# Patient Record
Sex: Female | Born: 1950
Health system: Southern US, Community
[De-identification: ages and names within clinical notes are randomized; demographics above are authoritative.]

## PROBLEM LIST (undated history)

## (undated) DIAGNOSIS — M069 Rheumatoid arthritis, unspecified: Secondary | ICD-10-CM

## (undated) DIAGNOSIS — F329 Major depressive disorder, single episode, unspecified: Secondary | ICD-10-CM

## (undated) DIAGNOSIS — F32A Depression, unspecified: Secondary | ICD-10-CM

## (undated) DIAGNOSIS — G4730 Sleep apnea, unspecified: Secondary | ICD-10-CM

## (undated) DIAGNOSIS — I10 Essential (primary) hypertension: Secondary | ICD-10-CM

## (undated) DIAGNOSIS — E785 Hyperlipidemia, unspecified: Secondary | ICD-10-CM

## (undated) DIAGNOSIS — R7611 Nonspecific reaction to tuberculin skin test without active tuberculosis: Secondary | ICD-10-CM

## (undated) DIAGNOSIS — N289 Disorder of kidney and ureter, unspecified: Secondary | ICD-10-CM

## (undated) HISTORY — PX: CERVICAL BIOPSY: SHX590

## (undated) HISTORY — PX: KIDNEY SURGERY: SHX687

## (undated) HISTORY — PX: BREAST SURGERY: SHX581

## (undated) HISTORY — PX: TUBAL LIGATION: SHX77

## (undated) HISTORY — PX: KNEE ARTHROPLASTY: SHX992

---

## 1996-07-01 HISTORY — PX: BREAST LUMPECTOMY: SHX2

## 1998-04-05 ENCOUNTER — Other Ambulatory Visit: Admission: RE | Admit: 1998-04-05 | Discharge: 1998-04-05 | Payer: Self-pay | Admitting: Obstetrics and Gynecology

## 2000-09-29 ENCOUNTER — Encounter: Payer: Self-pay | Admitting: Obstetrics and Gynecology

## 2000-10-03 ENCOUNTER — Encounter (INDEPENDENT_AMBULATORY_CARE_PROVIDER_SITE_OTHER): Payer: Self-pay | Admitting: Specialist

## 2000-10-03 ENCOUNTER — Ambulatory Visit (HOSPITAL_COMMUNITY): Admission: RE | Admit: 2000-10-03 | Discharge: 2000-10-03 | Payer: Self-pay | Admitting: Obstetrics and Gynecology

## 2001-11-26 ENCOUNTER — Other Ambulatory Visit: Admission: RE | Admit: 2001-11-26 | Discharge: 2001-11-26 | Payer: Self-pay | Admitting: Obstetrics and Gynecology

## 2001-12-03 ENCOUNTER — Ambulatory Visit (HOSPITAL_COMMUNITY): Admission: RE | Admit: 2001-12-03 | Discharge: 2001-12-03 | Payer: Self-pay | Admitting: *Deleted

## 2002-05-12 ENCOUNTER — Encounter: Admission: RE | Admit: 2002-05-12 | Discharge: 2002-05-12 | Payer: Self-pay | Admitting: *Deleted

## 2003-02-01 ENCOUNTER — Other Ambulatory Visit: Admission: RE | Admit: 2003-02-01 | Discharge: 2003-02-01 | Payer: Self-pay | Admitting: Obstetrics and Gynecology

## 2003-03-03 ENCOUNTER — Encounter: Admission: RE | Admit: 2003-03-03 | Discharge: 2003-03-03 | Payer: Self-pay | Admitting: Otolaryngology

## 2003-03-03 ENCOUNTER — Encounter: Payer: Self-pay | Admitting: Otolaryngology

## 2004-04-27 ENCOUNTER — Other Ambulatory Visit: Admission: RE | Admit: 2004-04-27 | Discharge: 2004-04-27 | Payer: Self-pay | Admitting: Obstetrics and Gynecology

## 2004-11-09 ENCOUNTER — Ambulatory Visit (HOSPITAL_COMMUNITY): Admission: RE | Admit: 2004-11-09 | Discharge: 2004-11-09 | Payer: Self-pay | Admitting: Gastroenterology

## 2004-11-16 ENCOUNTER — Ambulatory Visit (HOSPITAL_BASED_OUTPATIENT_CLINIC_OR_DEPARTMENT_OTHER): Admission: RE | Admit: 2004-11-16 | Discharge: 2004-11-16 | Payer: Self-pay | Admitting: Geriatric Medicine

## 2004-11-25 ENCOUNTER — Ambulatory Visit: Payer: Self-pay | Admitting: Internal Medicine

## 2005-05-31 ENCOUNTER — Encounter: Admission: RE | Admit: 2005-05-31 | Discharge: 2005-05-31 | Payer: Self-pay | Admitting: Obstetrics and Gynecology

## 2005-06-05 ENCOUNTER — Other Ambulatory Visit: Admission: RE | Admit: 2005-06-05 | Discharge: 2005-06-05 | Payer: Self-pay | Admitting: Obstetrics and Gynecology

## 2005-06-11 ENCOUNTER — Ambulatory Visit (HOSPITAL_COMMUNITY): Admission: RE | Admit: 2005-06-11 | Discharge: 2005-06-11 | Payer: Self-pay | Admitting: Obstetrics and Gynecology

## 2005-07-01 HISTORY — PX: NEPHRECTOMY: SHX65

## 2005-07-04 ENCOUNTER — Encounter: Admission: RE | Admit: 2005-07-04 | Discharge: 2005-07-04 | Payer: Self-pay | Admitting: Otolaryngology

## 2005-07-10 ENCOUNTER — Ambulatory Visit (HOSPITAL_COMMUNITY): Admission: RE | Admit: 2005-07-10 | Discharge: 2005-07-10 | Payer: Self-pay | Admitting: Urology

## 2005-08-16 ENCOUNTER — Inpatient Hospital Stay (HOSPITAL_COMMUNITY): Admission: RE | Admit: 2005-08-16 | Discharge: 2005-08-20 | Payer: Self-pay | Admitting: Urology

## 2005-08-16 ENCOUNTER — Encounter (INDEPENDENT_AMBULATORY_CARE_PROVIDER_SITE_OTHER): Payer: Self-pay | Admitting: Specialist

## 2007-04-13 ENCOUNTER — Encounter: Admission: RE | Admit: 2007-04-13 | Discharge: 2007-04-13 | Payer: Self-pay | Admitting: Obstetrics and Gynecology

## 2007-06-16 ENCOUNTER — Emergency Department (HOSPITAL_COMMUNITY): Admission: EM | Admit: 2007-06-16 | Discharge: 2007-06-17 | Payer: Self-pay | Admitting: Emergency Medicine

## 2007-09-22 ENCOUNTER — Encounter: Admission: RE | Admit: 2007-09-22 | Discharge: 2007-09-22 | Payer: Self-pay | Admitting: Obstetrics and Gynecology

## 2008-07-01 HISTORY — PX: ANKLE ARTHRODESIS: SUR49

## 2008-11-25 ENCOUNTER — Encounter: Admission: RE | Admit: 2008-11-25 | Discharge: 2008-11-25 | Payer: Self-pay | Admitting: Obstetrics and Gynecology

## 2009-06-25 ENCOUNTER — Emergency Department (HOSPITAL_COMMUNITY): Admission: EM | Admit: 2009-06-25 | Discharge: 2009-06-25 | Payer: Self-pay | Admitting: Emergency Medicine

## 2009-06-27 ENCOUNTER — Inpatient Hospital Stay (HOSPITAL_COMMUNITY): Admission: RE | Admit: 2009-06-27 | Discharge: 2009-06-29 | Payer: Self-pay | Admitting: Specialist

## 2009-09-28 ENCOUNTER — Encounter: Admission: RE | Admit: 2009-09-28 | Discharge: 2009-10-25 | Payer: Self-pay | Admitting: Specialist

## 2010-05-01 ENCOUNTER — Encounter: Admission: RE | Admit: 2010-05-01 | Discharge: 2010-05-01 | Payer: Self-pay | Admitting: Obstetrics and Gynecology

## 2010-06-07 ENCOUNTER — Encounter
Admission: RE | Admit: 2010-06-07 | Discharge: 2010-06-07 | Payer: Self-pay | Source: Home / Self Care | Admitting: Specialist

## 2010-07-22 ENCOUNTER — Encounter: Payer: Self-pay | Admitting: Urology

## 2010-10-01 LAB — BASIC METABOLIC PANEL
Chloride: 100 mEq/L (ref 96–112)
Creatinine, Ser: 0.95 mg/dL (ref 0.4–1.2)
GFR calc Af Amer: >60 mL/min (ref >60)
Potassium: 3.4 mEq/L — ABNORMAL LOW (ref 3.5–5.1)

## 2010-10-01 LAB — DIFFERENTIAL
Basophils Relative: 0 % (ref 0–1)
Eosinophils Relative: 8 % — ABNORMAL HIGH (ref 0–5)
Monocytes Absolute: 0.5 10*3/uL (ref 0.1–1.0)
Monocytes Relative: 7 % (ref 3–12)
Neutrophils Relative %: 63 % (ref 43–77)

## 2010-10-01 LAB — URINALYSIS, MICROSCOPIC ONLY
Nitrite: NEGATIVE
Specific Gravity, Urine: 1.028 (ref 1.005–1.030)
Urobilinogen, UA: 1 mg/dL (ref 0.0–1.0)

## 2010-10-01 LAB — PROTIME-INR
INR: 1.02 (ref 0.00–1.49)
Prothrombin Time: 13.3 seconds (ref 11.6–15.2)

## 2010-10-01 LAB — CBC
MCV: 86.1 fL (ref 78.0–100.0)
RBC: 3.97 MIL/uL (ref 3.87–5.11)
WBC: 7.3 10*3/uL (ref 4.0–10.5)

## 2010-11-16 NOTE — Procedures (Signed)
Diana [name], Diana [name]                 ACCOUNT NO.:  [id number]   MEDICAL RECORD NO.:  [id number]          PATIENT TYPE:  OUT   LOCATION:  SLEEP CENTER                 FACILITY:  [facility]   PHYSICIAN:  Clinton D. Maple Hudson, M.D. DATE OF BIRTH:  1951/03/08   DATE OF STUDY:  11/16/2004                              NOCTURNAL POLYSOMNOGRAM   REFERRING PHYSICIAN:  Feliciana Rossetti, MD   INDICATION FOR STUDY:  Hypersomnia with sleep apnea.  Epworth sleepiness  score 17/24, BMI 28, weight 159 pounds.   SLEEP ARCHITECTURE:  Total sleep time 386 minutes with sleep efficiency 88%.  Stage I was 4%, stage II 36% , stages III and IV 44%, REM was 15%.  Sleep  latency 1 minute, REM latency 195 minutes, awake after sleep onset 52  minutes, arousal index 27.  No bedtime medications were taken.   RESPIRATORY DATA:  Split study protocol.  Respiratory disturbance index  (RDI, AHI) 34.6 obstructive events per hour, indicating moderately severe  obstructive sleep apnea/hypopnea syndrome before CPAP.  This included 31  obstructive apneas and 42 hypopneas before CPAP.  Events were not  positional.  REM RDI 5 per hour.  CPAP was titrated to 7 CWP, RDI 1.7 per  hour using a small ResMed Ultra Mirage full face mask with heated  humidifier.   OXYGEN DATA:  Moderate snoring with oxygen desaturation to a nadir of 74%  for CPAP.  After CPAP control, oxygen saturation held 95-98% on room air.   CARDIAC DATA:  Normal sinus rhythm.   MOVEMENT/PARASOMNIA:  Occasional leg jerk with insignificant effect on  sleep.   IMPRESSION/RECOMMENDATION:  1.  Moderately severe obstructive sleep apnea/hypopnea syndrome, RDI 34.6      per hour with moderate snoring and oxygen desaturation to 74%.  2.  Successful continuous positive airway pressure titration to 7 CWP, RDI      1.7 per hour, using a small Ultra Mirage full face mask with heated      humidifier.      Clinton D. Maple Hudson, M.D.  Diplomat    CDY/MEDQ  D:  11/25/2004  11:05:36  T:  11/25/2004 11:53:25  Job:  [id number]

## 2010-11-16 NOTE — Op Note (Signed)
Diana [name], Diana [name]                 ACCOUNT NO.:  [id number]   MEDICAL RECORD NO.:  [id number]          PATIENT TYPE:  AMB   LOCATION:  ENDO                         FACILITY:  [facility]   PHYSICIAN:  Petra Kuba, M.D.    DATE OF BIRTH:  [birth year]   DATE OF PROCEDURE:  11/09/2004  DATE OF DISCHARGE:                                 OPERATIVE REPORT   PROCEDURE PERFORMED:  Colonoscopy.   ENDOSCOPIST:  Petra Kuba, M.D.   INDICATIONS FOR PROCEDURE:  Screening.   Consent was signed after the risks, benefits, methods and options were  thoroughly discussed in the office.   MEDICINES USED:  Demerol 100 mg, Versed 8.5 mg.   DESCRIPTION OF PROCEDURE:  Rectal inspection was pertinent for external  hemorrhoids.  Digital exam was negative.  A video pediatric adjustable  colonoscope was inserted and with mild difficulty due to a long looping  colon, with rolling her on her back and abdominal pressure, we were able to  advance to the cecum.  The cecum was identified by the appendiceal orifice  and the ileocecal valve.  No abnormalities were seen on insertion.  The  scope was slowly withdrawn.  The prep was adequate.  There was some liquid  stool that required washing and suctioning.  On slow withdrawal through the  colon, no abnormalities were seen.  Specifically, no polyps, tumors, masses  or diverticula.  Once back in the rectum, anorectal pull-through and  retroflexion confirmed some small hemorrhoids.  Scope was straightened and  readvanced a short ways up the left side of the colon, air was suctioned,  scope removed.  The patient tolerated the procedure well.  There was no  immediate obvious complication.   ENDOSCOPIC DIAGNOSES:  1.  Internal and external small hemorrhoids.  2.  Otherwise within normal limits to the cecum.   PLAN:  Yearly rectals and guaiacs by either Dr. Alwyn Ren or Dr. Arelia Sneddon.  Happy  to see back p.r.n.  Otherwise repeat screening in five to 10 years.      MEM/MEDQ  D:  11/09/2004  T:  11/09/2004  Job:  [id number]   cc:   Juluis Mire, M.D.  [address] [place]  [place] [address]  Fax: [phone]   Titus Dubin. Alwyn Ren, M.D. [facility]

## 2010-11-16 NOTE — H&P (Signed)
NAMEKRYSTALL, Diana [name]                 ACCOUNT NO.:  [id number]   MEDICAL RECORD NO.:  [id number]          PATIENT TYPE:  INP   LOCATION:  1428                         FACILITY:  [facility]   PHYSICIAN:  Ronald L. Earlene Plater, M.D.  DATE OF BIRTH:  04/11/1951   DATE OF ADMISSION:  08/16/2005  DATE OF DISCHARGE:                                HISTORY & PHYSICAL   CHIEF COMPLAINT:  Right nonfunctional kidney with persistent flank pain.   HISTORY OF PRESENT ILLNESS:  The patient is a very pleasant 60 year old  gentleman who states she has a long history of right-sided flank pain. This  has never been worked up until recently when the pain became more severe.  The pain was located in the right flank area radiating around to the right  abdomen and groin.  She denied any history of hematuria or lower tract  symptoms. On the initial ultrasound by Dr. Richardean Chimera, it  demonstrated a  right hydronephrosis. A CT scan was then obtained which demonstrated a  marked chronic hydronephrosis with renal cortical thickening, suggesting  chronic UPJ stenosis. She also underwent a nuclear medicine scan that showed  differential function. This demonstrated obstructive hydronephrosis. There  was no response to upright position during Lasix.  Relative function, in  this kidney was 13%.  After reviewing her options, the patient has elected  to proceed with surgical removal.   PAST MEDICAL HISTORY:  1.  Hypertension.  2.  Sleep apnea.  3.  The patient is a Jehovah's Witness  4.  The patient has worked in health care for many years and has a positive      PPD; and is advised not to have a PPD done in future.   MEDICATIONS:  1.  Micardis.  2.  Multivitamin.   ALLERGIES:  LATEX contact in nature.   PAST SURGICAL HISTORY:  Right breast biopsy, bilateral tubal ligation,  cervical biopsy, and removal of right breast mass in 1998.   SOCIAL HISTORY:  The patient has a 15-pack-year history of smoking.  No  alcohol or use.  She is a Water quality scientist.   FAMILY HISTORY:  Significant for coronary artery disease and hypertension.   REVIEW OF SYSTEMS:  She has some constipation and some heat and cold  intolerance; and otherwise is negative, unless conducted above.   PHYSICAL EXAM:  For complete physical exam please consult clinic note from  Dr. Jinny Sanders clinic day #1, 07/05/2005. This is part of the permanent medical  record is unchanged.   IMPRESSION:  Right-sided hydronephrosis, nonfunctional kidney with  associated pain   PLAN:  We have reviewed the patient's options and she would like to proceed  with a right laparoscopic nephrectomy.     ______________________________  Glade Nurse, MD      Lucrezia Starch. Earlene Plater, M.D.  Electronically Signed    MT/MEDQ  D:  08/16/2005  T:  08/16/2005  Job:  [id number]

## 2010-11-16 NOTE — Discharge Summary (Signed)
[name], [name]                 ACCOUNT NO.:  [id number]   MEDICAL RECORD NO.:  [id number]          PATIENT TYPE:  INP   LOCATION:  1428                         [facility]ACILITY:  [facility]   PHYSICIAN:  Ronald L. Earlene Plater, M.D.  DATE OF BIRTH:  08-19-1950   DATE OF ADMISSION:  08/16/2005  DATE OF DISCHARGE:  08/20/2005                                 DISCHARGE SUMMARY   DISCHARGE DIAGNOSIS:  Right nonfunctioning kidney with chronic pain.   PROCEDURE:  Right laparoscopic nephrectomy.   SURGEON:  Lucrezia Starch. Earlene Plater, M.D.   HOSPITAL COURSE:  On August 16, 2005, the patient was taken to the  operating room and underwent the above named procedure which she tolerated  without complications. She was taken to the floor in stable condition where  she remained throughout her hospital stay which consisted of progressive  toleration of ambulation, diet and pain. The patient's hospital course was  prolonged by 1-2 days by constipation which was treated with ambulation as  well as stool softeners. On August 20, 2005, it was determined the patient  was in stable condition to be discharged home.   EXAMINATION AT DISCHARGE:  VITAL SIGNS:  Per logician sheet.  ABDOMEN:  Positive bowel sounds, soft, nondistended, nontender to palpation  without costovertebral angle tenderness. Incision clean, dry and intact  without surrounding erythema or exudate. Steri-Strips are in place.   For the remainder of the physical exam, please consult the admission H&P as  it is unchanged.   DISPOSITION:  To home.   DISCHARGE INSTRUCTIONS:  The patient is given routine wound care  instructions and instructed to call or return if she has any fever, chills,  nausea, vomiting, redness, or drainage from her wound. She understands not  to soak her wound until postoperative day #7. She understands not to lift  more than 10 pounds for the next 6 weeks. She understands not to drive while  on narcotics for the next 2 weeks. She is  scheduled to followup with Dr.  Earlene Plater in 1-2 weeks for a wound check.   DISCHARGE MEDICATIONS:  1.  Vicodin.  2.  Colace.  3.  Resume previous meds.     ______________________________  Glade Nurse, MD      Lucrezia Starch. Earlene Plater, M.D.  Electronically Signed    MT/MEDQ  D:  08/20/2005  T:  08/21/2005  Job:  [id number]   cc:   Crecencio Mc, M.D.  Fax: [phone]   Patient's Primary Care Physician

## 2010-11-16 NOTE — Op Note (Signed)
NAMEVERENIS, NICOSIA                 ACCOUNT NO.:  [id number]   MEDICAL RECORD NO.:  [id number]          PATIENT TYPE:  INP   LOCATION:  1428                         FACILITY:  [facility]   PHYSICIAN:  Heloise Purpura, MD      DATE OF BIRTH:  [birth year]   DATE OF PROCEDURE:  08/16/2005  DATE OF DISCHARGE:                                 OPERATIVE REPORT   PREOPERATIVE DIAGNOSIS:  1.  Right ureteropelvic junction obstruction.  2.  Poorly functioning right kidney.  3.  Right flank pain.   POSTOPERATIVE DIAGNOSES:  1.  Right ureteropelvic junction obstruction.  2.  Poorly functioning right kidney.  3.  Right flank pain.   PROCEDURE:  Right laparoscopic nephrectomy.   SURGEON:  Dr. Gaynelle Arabian.   ASSISTANT:  Dr. Heloise Purpura and Dr. Glade Nurse.   ANESTHESIA:  General.   COMPLICATIONS:  None.   ESTIMATED BLOOD LOSS:  50 mL.   INTRAVENOUS FLUIDS:  2100 mL of lactated Ringer's.   INDICATIONS FOR PROCEDURE:  Ms. Domino is a 60 year old female with right-  sided flank pain. She was initially found to have right-sided hydronephrosis  incidentally on an ultrasound that was performed for other reasons. She  subsequently has become more symptomatic and after an evaluation was found  to have findings consistent with a right ureteropelvic junction obstruction  as well as a poorly functioning right kidney. After discussing management  options, the patient elected to proceed with nephrectomy. After discussing  options and surgical techniques, the patient wished to have this performed  via a laparoscopic technique. The potential risks and benefits of the above  procedure was discussed with the patient and she consented.   DESCRIPTION OF PROCEDURE:  The patient was taken to the operating room and a  general anesthetic was administered. The patient was given preoperative  antibiotics, placed in the right modified flank position, and prepped and  draped in the usual sterile fashion. Next a  site was selected in the midline  just superior to the umbilicus for placement of the camera port. This was  placed using a standard Hasson technique. Zero Vicryl holding stitches were  placed in the rectus fascia to hold the Hasson port in place. A  pneumoperitoneum was then established. A 30 degree lens was then used to  inspect the abdomen and there were no abdominal injuries or other  abnormalities. The remaining ports were then placed. A 5 mm port was placed  midway between the umbilicus and xiphoid process just to the right of the  falciform ligament. A 12 mm port was placed in the right lower quadrant just  lateral to the rectus muscle. An additional 5 mm port was placed in the far  right lateral abdominal wall. With the aid of the harmonic scalpel, the  white line of Toldt was incised along the length of the ascending colon  allowing the colon to be mobilized medially. The patient was noted to have a  very large and dilated renal pelvis that extended medially over the inferior  vena cava. After the colon was reflected medially  and the renal pelvis was  exposed, a crossing lower pole renal artery was identified and likely  represented the etiology of the patient's ureteropelvic junction  obstruction. This renal artery was dissected free and then ligated with  multiple Hem-o-Lok clips and divided. The ureter was similarly divided just  inferior to the lower pole of the kidney and divided between multiple Hem-o-  Lok clips. Dissection then continued posteriorly and laterally to free up  and mobilize the kidney. Attention then turned superiorly. With the liver  retracted cephalad, the peritoneal attachments were divided over the upper  pole of the kidney. Inspection then returned to the renal hilar area.  Another renal artery was identified and divided between Hem-o-Lok clips. The  main renal vein was then also identified and isolated. After isolation with  right angle dissection, it  was divided between multiple large Hem-o-Lok  clips. The space between the adrenal gland and the superior pole of the  kidney was then identified. This tissue was taken down with the harmonic  scalpel resulting in right adrenal gland preservation. The remaining  attachments to the lateral aspect of the abdominal wall was then identified  and taken down with the harmonic scalpel. This allowed the kidney specimen  to be freed and placed up in the liver. The renal fossa was copiously  irrigated and examined and hemostasis appeared adequate. A #0 Vicryl stitch  was then placed through the abdominal wall fascia of the right lower  quadrant 12 mm port site for later port site closure. The Hasson port was  then removed and the 15 mm EndoCatch 2 device was placed through this port  and used to a device to eventually remove the kidney. The kidney was placed  into the EndoCatch 2 bag and then aspiration needle was then used to  aspirate the fluid from the renal pelvis. Once the collecting system of the  right kidney was fully aspirated the EndoCatch 2 bag was closed so that no  contents spilled within the right renal fossa. All ports were then removed  under direct vision after the renal fossa was again examined and hemostasis  ensured. The original [facility] port site was then slightly extended superiorly  allowing the specimen to be removed intact within the EndoCatch 2 bag. This  fascial opening was closed with a running #0 Vicryl suture. The right lower  quadrant 12 mm port site was then closed with the previously placed #0  Vicryl suture. A 0.25% Marcaine was then injected into all incision sites  which were reapproximated at the skin level with 4-0 Monocryl subcuticular  closures. Steri-Strips and sterile dressings were applied. The patient  appeared to tolerate the procedure well without complications. All sponge  and needle counts were correct x2 at the end of the procedure. She was able to be  extubated and transferred to the recovery unit in satisfactory  condition.           ______________________________  Heloise Purpura, MD  Electronically Signed     LB/MEDQ  D:  08/16/2005  T:  08/16/2005  Job:  [id number]

## 2010-11-16 NOTE — Op Note (Signed)
[facility]  Patient:    Diana [name], Diana [name]                          MRN: [id number] Proc. Date: 10/03/00 Adm. Date:  [id number] Attending:  Frederich Balding                           Operative Report  PREOPERATIVE DIAGNOSIS:  Abnormal endometrial cells on Papanicolaou smear, rule out endometrial pathology.  POSTOPERATIVE DIAGNOSIS:  Abnormal endometrial cells on Papanicolaou smear, rule out endometrial pathology.  PROCEDURE:  Hysteroscopy with endometrial resections as well as endometrial curettings.  SURGEON:  Juluis Mire, M.D.  ANESTHESIA:  Sedation with paracervical block.  ESTIMATED BLOOD LOSS:  Minimal.  PACKS AND DRAINS:  None.  INTRAOPERATIVE BLOOD REPLACED:  None.  COMPLICATIONS:  None.  INDICATIONS:  As dictated in the history and physical.  DESCRIPTION OF PROCEDURE:  Patient taken to the OR and placed in supine position.  After slight sedation, was placed in the dorsal lithotomy position using the Allen stirrups.  The patient was then draped out for a hysteroscopy. A speculum was placed in the vaginal vault.  The cervix and vagina were cleansed with Hibiclens.  A paracervical block was then instituted using 1% Xylocaine.  The cervix was secured with a single-tooth tenaculum.  The uterus sounded to approximately 10 cm and was slightly deviated to the right.  We then began cervical dilatation, which the patient tolerated well.  We dilated her up to a 35 Pratt dilator.  We introduced the resectoscope, and then we realized there was a further deviation to the left of the intrauterine cavity and we were not quite to that.  We went about redilating and re-angling the dilators more to the right.  The hysteroscope was then reintroduced, and we were in the endometrial cavity.  There was no evidence of any perforation. She had a smooth-walled endometrial cavity.  There were no real excrescences. There was a slight bulging to the right, which  may be from a fibroid.  We did multiple endometrial resections, both on the anterior, posterior, and lateral walls.  These were all went for pathologic review.  There was no active bleeding or sign of perforation.  Endometrial curettings were then obtained. The resectoscope had been removed.  There was no active vaginal bleeding at the present time.  Her deficit was minimal.  The single-tooth tenaculum and speculum were then removed, the patient taken out of the dorsal lithotomy position, once alert transferred to the recovery room in good condition. Sponge, instrument, and needle count were reported as correct by the circulating nurse. DD:  10/03/00 TD:  10/03/00 Job: [id number]

## 2010-11-16 NOTE — Op Note (Signed)
Diana [name], TISSUE                 ACCOUNT NO.:  [id number]   MEDICAL RECORD NO.:  [id number]          PATIENT TYPE:  INP   LOCATION:  1428                         [facility]ACILITY:  [facility]   PHYSICIAN:  Ronald L. Earlene Plater, M.D.  DATE OF BIRTH:  [birth year]   DATE OF PROCEDURE:  08/16/2005  DATE OF DISCHARGE:                                 OPERATIVE REPORT   PREOPERATIVE DIAGNOSIS:  Right nonfunctional kidney with chronic flank pain.   POSTOPERATIVE DIAGNOSIS:  Right nonfunctional kidney with chronic flank  pain.   PROCEDURE:  Right laparoscopic nephrectomy.   SURGEON:  Lucrezia Starch. Earlene Plater, MD.   ASSISTANT:  1.  Heloise Purpura, MD.  2.  Glade Nurse, MD.   ANESTHESIA:  General endotracheal.   SPECIMEN:  Right kidney.   DRAINS:  None.   ESTIMATED BLOOD LOSS:  Less than 100 mL.   DESCRIPTION OF PROCEDURE:  The patient was identified by her wrist bracelet  and brought to room 10 where she received preprocedure antibiotics and was  administered general anesthesia. She was prepped and draped in the usual  sterile fashion taking great care to minimize the risk of peripheral  neuropathy or compartment syndrome. Next using the Hasson technique, we made  a 2 cm incision just superior to her umbilicus. We dissected down to the  external sheath which was divided with Bovie cautery. The rectus fibers were  split, the posterior sheath was divided with Bovie cautery. We  then bluntly  entered the peritoneum with the surgeon's first finger. A 12 mm Hasson  trocar was placed after placing #0 Vicryl holding sutures in the edge of the  fascia. Next, we established pneumoperitoneum, inspected the interior of the  abdomen. We were able to visualize a small but markedly hydronephrotic  kidney with minimal frank and very large dilated pelvis prior to mobilizing  the colon. Next under direct visualization, through each port site we  dilated the port site with 0.5% lidocaine and made the appropriate size  incision. We placed the trocar under direct visualization. We placed a 5 mm  trocar in the upper right quadrant. We placed a 12 mm working port just  lateral to the midline between the umbilicus and the xiphoid and we placed  the second 5 mm working port in the right lower quadrant. Next, using a  combination of sharp and blunt dissection, we freed the colon by mobilizing  the white line from the hepatic flexure down to the level of the cecum. We  rotated the colon medially. We then bluntly developed a plane between the  colon and Gerota's fascia. We dissected down onto this plane medially onto  the psoas muscle. We then elevated the lower pole and we were able to  identify the ureter in the medial edge of this tissue. We dissected it out  with right angle dissection. It was ligated and divided with Weck clips and  sharp dissection. We then followed this plane to the level of the hilum.  Interestingly, we did visualize a crossing vessel at the level of the UPJ.  It was dissected,  ligated and divided with Weck clips and sharp dissection.  We continued to mark up the hilum in a superior fashion. We encountered the  renal vein which was handled in a similar fashion. It was ligated with Weck  clips, 2 on the caval side and one on the specimen side and divided sharply.  A second small caliber renal artery was located immediately posterior to it.  This was ligated and divided in similar fashion. Next, we continued around  the superior aspect of the kidney dissecting the kidney with a combination  of blunt and sharp dissection with the harmonic scalpel from its remaining  attachments. The kidney was then rotated laterally and we removed the  lateral attachment in a similar fashion. The kidney was entirely free at  this point, it was placed on the liver. We inspected the operative bed, it  was noted to be markedly hemostatic. Next, we removed our right lower  quadrant 12 mm port and using a striker  suture passer placed a #0 Vicryl  suture through this to facilitate closure of this site. We then reinserted  the port and we then removed our Hasson port and passed an EndoCatch bag  through this port. We placed a specimen in the bag. We next used a  laparoscopic aspiration needle to decompress the renal pelvis and  approximately 40 mL of straw colored urine was removed. We next removed all  of our ports under direct visualization, excellent hemostasis was noted.  Following this, we removed our EndoCatch bag. To do this, we had to open the  fascia approximately 1 cm in additional length in a superior direction. We  were able to remove the bag without difficulty. Following this, we secured  our aforementioned fascial suture on our 12 mm port site. We then closed the  fascia on our Hasson port site with a running #0 Vicryl. The skin was then  closed in all port sites with a 4-0 subcuticular Monocryl after additional  10 mL of 0.5% lidocaine was administered over all the port sites. Dressings  were placed. The patient's anesthesia was reversed which she tolerated  without complication. She was taken to the PACU in stable condition.  Please  note Dr. Gaynelle Arabian and Dr. Leeroy Bock were present and participated in  all aspects of this case.     ______________________________  Glade Nurse, MD      Lucrezia Starch. Earlene Plater, M.D.  Electronically Signed    MT/MEDQ  D:  08/16/2005  T:  08/16/2005  Job:  [id number]

## 2010-11-16 NOTE — H&P (Signed)
[facility]  Patient:    Diana [name], Diana [name]                          MRN: [id number] Adm. Date:  10/03/00 Attending:  Juluis Mire, M.D.                         History and Physical  HISTORY OF PRESENT ILLNESS:  This is a 59 year old gravida 5, para 2, abortus 3, black female who presents for a hysteroscopic evaluation.  In relation to the present admission, the patient was last seen in our practice on January 17th for annual examination and Pap smear.  Her Pap smear at that time had revealed endometrial cells.  In view of her premenopausal status, they felt this was out of phase and recommended further evaluation. She underwent ultrasound evaluation in the office which revealed multiple small uterine fibroids and a 14-mm endometrial stripe.  Both ovaries appeared to be normal.  We were unable to do an endometrial sampling in the office; in view of this, she presents for hysteroscopic evaluation to rule out any type of endometrial pathology.  ALLERGIES:  In terms of allergies, she is allergic to LATEX.  MEDICATIONS:  Medications include Ditropan 5 mg per day.  PAST MEDICAL HISTORY:  She does have a history of hypertension; she is followed by the [facility] for that, not on any medications. Otherwise, usual childhood diseases, no significant sequelae.  PREVIOUS SURGICAL HISTORY:  She has had a previous bilateral tubal ligation in 1972.  She had a lump removed from the right breast in 1968.  OBSTETRICAL HISTORY:  She has had two spontaneous vaginal deliveries and three ABs.  FAMILY HISTORY:  Mother has a history of hypertension.  SOCIAL HISTORY:  No tobacco or alcohol use.  REVIEW OF SYSTEMS:  Noncontributory.  PHYSICAL EXAMINATION:  VITAL SIGNS:  Patient is afebrile with stable vital signs.  HEENT:  Patient is normocephalic.  Pupils equal, round and reactive to light and accommodation.  Extraocular movements were intact.  Sclerae  and conjunctivae clear.  Oropharynx clear.  NECK:  Neck without thyromegaly.  BREASTS:  No discrete masses.  LUNGS:  Clear.  CARDIAC:  Regular rate and rhythm without murmurs or gallops.  ABDOMEN:  Her abdominal exam is benign.  PELVIC:  Normal external genitalia.  Vaginal mucosa clear.  Cervix unremarkable.  Uterus normal size, shape and contour.  Adnexa free of masses or tenderness.  EXTREMITIES:  Trace edema.  NEUROLOGIC:  Exam is grossly within normal limits.  IMPRESSION:  Abnormal cervical cytology suggestive of possible endometrial pathology.  PLAN:  The patient will undergo hysteroscopy with D&C for further evaluation. The risks have been discussed including the risk of anesthesia, risk of infection, risk of hemorrhage that could require transfusion or hysterectomy, the risk of perforation that could lead to injury to adjacent organs requiring laparoscopy and exploratory laparotomy, the risk of fluid overload that could lead to pulmonary edema or hyponatremia with its implications.  Patient expressed understanding of indications and risks.DD:  10/03/00 TD:  10/03/00 Job: [id number]

## 2011-04-05 LAB — CBC
HCT: 37.5
MCHC: 34.6
MCV: 82.6
Platelets: 295

## 2011-04-05 LAB — D-DIMER, QUANTITATIVE: D-Dimer, Quant: 2.13 — ABNORMAL HIGH

## 2011-04-05 LAB — BASIC METABOLIC PANEL
BUN: 17
CO2: 28
Chloride: 101
Creatinine, Ser: 1.09
Glucose, Bld: 138 — ABNORMAL HIGH
Potassium: 3 — ABNORMAL LOW

## 2011-05-14 ENCOUNTER — Other Ambulatory Visit: Payer: Self-pay | Admitting: Obstetrics and Gynecology

## 2011-05-14 DIAGNOSIS — Z1231 Encounter for screening mammogram for malignant neoplasm of breast: Secondary | ICD-10-CM

## 2011-06-07 ENCOUNTER — Ambulatory Visit
Admission: RE | Admit: 2011-06-07 | Discharge: 2011-06-07 | Disposition: A | Discharge status: Home / Self Care | Payer: 59 | Source: Ambulatory Visit | Attending: Obstetrics and Gynecology | Admitting: Obstetrics and Gynecology

## 2011-06-07 DIAGNOSIS — Z1231 Encounter for screening mammogram for malignant neoplasm of breast: Secondary | ICD-10-CM

## 2011-08-28 ENCOUNTER — Emergency Department (HOSPITAL_COMMUNITY)
Admission: EM | Admit: 2011-08-28 | Discharge: 2011-08-29 | Disposition: A | Discharge status: Home / Self Care | Payer: 59 | Attending: Emergency Medicine | Admitting: Emergency Medicine

## 2011-08-28 ENCOUNTER — Encounter (HOSPITAL_COMMUNITY): Payer: Self-pay | Admitting: Emergency Medicine

## 2011-08-28 DIAGNOSIS — I10 Essential (primary) hypertension: Secondary | ICD-10-CM | POA: Insufficient documentation

## 2011-08-28 DIAGNOSIS — R51 Headache: Secondary | ICD-10-CM | POA: Insufficient documentation

## 2011-08-28 DIAGNOSIS — M25569 Pain in unspecified knee: Secondary | ICD-10-CM | POA: Insufficient documentation

## 2011-08-28 DIAGNOSIS — IMO0001 Reserved for inherently not codable concepts without codable children: Secondary | ICD-10-CM | POA: Insufficient documentation

## 2011-08-28 DIAGNOSIS — M25469 Effusion, unspecified knee: Secondary | ICD-10-CM | POA: Insufficient documentation

## 2011-08-28 DIAGNOSIS — G4730 Sleep apnea, unspecified: Secondary | ICD-10-CM | POA: Insufficient documentation

## 2011-08-28 DIAGNOSIS — M7989 Other specified soft tissue disorders: Secondary | ICD-10-CM | POA: Insufficient documentation

## 2011-08-28 DIAGNOSIS — M25461 Effusion, right knee: Secondary | ICD-10-CM

## 2011-08-28 DIAGNOSIS — M069 Rheumatoid arthritis, unspecified: Secondary | ICD-10-CM | POA: Insufficient documentation

## 2011-08-28 DIAGNOSIS — E785 Hyperlipidemia, unspecified: Secondary | ICD-10-CM | POA: Insufficient documentation

## 2011-08-28 HISTORY — DX: Rheumatoid arthritis, unspecified: M06.9

## 2011-08-28 HISTORY — DX: Hyperlipidemia, unspecified: E78.5

## 2011-08-28 HISTORY — DX: Essential (primary) hypertension: I10

## 2011-08-28 HISTORY — DX: Sleep apnea, unspecified: G47.30

## 2011-08-28 NOTE — ED Notes (Signed)
Patient with right knee pain and swelling. Patient states that it started around 5pm, denies any injury.

## 2011-08-29 ENCOUNTER — Emergency Department (HOSPITAL_COMMUNITY): Payer: 59

## 2011-08-29 LAB — CBC
HCT: 40.2 % (ref 36.0–46.0)
MCH: 28.6 pg (ref 26.0–34.0)
MCHC: 33.3 g/dL (ref 30.0–36.0)
MCV: 85.9 fL (ref 78.0–100.0)
Platelets: 333 10*3/uL (ref 150–400)
RDW: 12.7 % (ref 11.5–15.5)
WBC: 10.4 10*3/uL (ref 4.0–10.5)

## 2011-08-29 LAB — DIFFERENTIAL
Basophils Absolute: 0 10*3/uL (ref 0.0–0.1)
Eosinophils Absolute: 0.6 10*3/uL (ref 0.0–0.7)
Eosinophils Relative: 6 % — ABNORMAL HIGH (ref 0–5)
Lymphocytes Relative: 26 % (ref 12–46)
Monocytes Absolute: 0.6 10*3/uL (ref 0.1–1.0)

## 2011-08-29 LAB — POCT I-STAT, CHEM 8
BUN: 16 mg/dL (ref 6–23)
Calcium, Ion: 1.24 mmol/L (ref 1.12–1.32)
Creatinine, Ser: 1.1 mg/dL (ref 0.50–1.10)
Hemoglobin: 13.9 g/dL (ref 12.0–15.0)
TCO2: 29 mmol/L (ref 0–100)

## 2011-08-29 MED ORDER — PREDNISONE 20 MG PO TABS
20.0000 mg | ORAL_TABLET | Freq: Once | ORAL | Status: AC
  Administered 2011-08-29: 20 mg via ORAL
  Filled 2011-08-29: qty 1

## 2011-08-29 MED ORDER — PREDNISONE 20 MG PO TABS: 20.0000 mg | ORAL_TABLET | Freq: Every day | ORAL | Status: AC

## 2011-08-29 NOTE — ED Provider Notes (Signed)
History     CSN: [id number]  Arrival date & time 08/28/11  2109   First MD Initiated Contact with Patient 08/29/11 0004      Chief Complaint  Patient presents with  . Joint Swelling    (Consider location/radiation/quality/duration/timing/severity/associated sxs/prior treatment) HPI Comments: Patient states she had sudden onset of right knee pain and swelling.  She does have a history of rheumatoid arthritis, but has never had a flare.  She also has myalgias, headache, denies any injury falls, DVT risk factors  The history is provided by the patient.    Past Medical History  Diagnosis Date  . Hypertension   . Sleep apnea   . Hyperlipidemia   . Rheumatoid arthritis     Past Surgical History  Procedure Date  . Kidney surgery     History reviewed. No pertinent family history.  History  Substance Use Topics  . Smoking status: Never Smoker   . Smokeless tobacco: Not on file  . Alcohol Use: No    OB History    Grav Para Term Preterm Abortions TAB SAB Ect Mult Living                  Review of Systems  Constitutional: Positive for chills. Negative for fever.  Respiratory: Negative for shortness of breath.   Cardiovascular: Positive for leg swelling. Negative for chest pain.  Musculoskeletal: Positive for myalgias and joint swelling.  Skin: Negative for wound.    Allergies  Latex and Sulfa antibiotics  Home Medications   Current Outpatient Rx  Name Route Sig Dispense Refill  . FLUOXETINE HCL 10 MG PO CAPS Oral Take 10 mg by mouth daily.    . CENTRUM SILVER PO Oral Take 1 tablet by mouth daily.    Marland Kitchen NAPROXEN SODIUM 220 MG PO TABS Oral Take 440 mg by mouth 2 (two) times daily with a meal.    . OVER THE COUNTER MEDICATION Oral Take 1 tablet by mouth 2 (two) times daily. Nature's Own Hair, Skin, and Nails    . TELMISARTAN 80 MG PO TABS Oral Take 80 mg by mouth daily.    Marland Kitchen PREDNISONE 20 MG PO TABS Oral Take 1 tablet (20 mg total) by mouth daily. 10 tablet 0     BP 124/72  Pulse 72  Temp(Src) 98.2 F (36.8 C) (Oral)  Resp 20  SpO2 98%  Physical Exam  Constitutional: She is oriented to person, place, and time. She appears well-developed and well-nourished.  HENT:  Head: Normocephalic.  Neck: Normal range of motion.  Cardiovascular: Normal rate.   Pulmonary/Chest: Effort normal.  Musculoskeletal: She exhibits edema and tenderness.       Slight medial knee swelling decreased ROM  Neurological: She is alert and oriented to person, place, and time.  Skin: Skin is warm and dry.    ED Course  Procedures (including critical care time)  Labs Reviewed  DIFFERENTIAL - Abnormal; Notable for the following:    Eosinophils Relative 6 (*)    All other components within normal limits  POCT I-STAT, CHEM 8 - Abnormal; Notable for the following:    Glucose, Bld 100 (*)    All other components within normal limits  CBC  LAB REPORT - SCANNED   Dg Knee 2 Views Right  08/29/2011  *RADIOLOGY REPORT*  Clinical Data: Right knee pain and swelling.  RIGHT KNEE - 1-2 VIEW  Comparison: None.  Findings: There is no evidence of fracture or dislocation.  The joint spaces are  preserved.  Mild marginal osteophytes are noted arising at all three compartments, and at the tibial spine.  An enthesophyte is noted at the upper pole of the patella.  A small to moderate knee joint effusion is seen.  The visualized soft tissues are otherwise unremarkable in appearance.  IMPRESSION:  1.  No evidence of fracture or dislocation. 2.  Small to moderate knee joint effusion seen. 3.  Minimal tricompartmental osteoarthritis noted.  Original Report Authenticated By: Tonia Ghent, M.D.     1. Knee effusion, right       MDM  Osteoarthritis with knee effusion        Arman Filter, NP 08/29/11 2046  Arman Filter, NP 08/29/11 2046

## 2011-08-29 NOTE — Discharge Instructions (Signed)
Knee Effusion The medical term for having fluid in your knee is effusion. This is often due to an internal derangement of the knee. This means something is wrong inside the knee. Some of the causes of fluid in the knee may be torn cartilage, a torn ligament, or bleeding into the joint from an injury. Your knee is likely more difficult to bend and move. This is often because there is increased pain and pressure in the joint. The time it takes for recovery from a knee effusion depends on different factors, including:   Type of injury.   Your age.   Physical and medical conditions.   Rehabilitation Strategies.  How long you will be away from your normal activities will depend on what kind of knee problem you have and how much damage is present. Your knee has two types of cartilage. Articular cartilage covers the bone ends and lets your knee bend and move smoothly. Two menisci, thick pads of cartilage that form a rim inside the joint, help absorb shock and stabilize your knee. Ligaments bind the bones together and support your knee joint. Muscles move the joint, help support your knee, and take stress off the joint itself. CAUSES  Often an effusion in the knee is caused by an injury to one of the menisci. This is often a tear in the cartilage. Recovery after a meniscus injury depends on how much meniscus is damaged and whether you have damaged other knee tissue. Small tears may heal on their own with conservative treatment. Conservative means rest, limited weight bearing activity and muscle strengthening exercises. Your recovery may take up to 6 weeks.  TREATMENT  Larger tears may require surgery. Meniscus injuries may be treated during arthroscopy. Arthroscopy is a procedure in which your surgeon uses a small telescope like instrument to look in your knee. Your caregiver can make a more accurate diagnosis (learning what is wrong) by performing an arthroscopic procedure. If your injury is on the inner  margin of the meniscus, your surgeon may trim the meniscus back to a smooth rim. In other cases your surgeon will try to repair a damaged meniscus with stitches (sutures). This may make rehabilitation take longer, but may provide better long term result by helping your knee keep its shock absorption capabilities. Ligaments which are completely torn usually require surgery for repair. HOME CARE INSTRUCTIONS  Use crutches as instructed.   If a brace is applied, use as directed.   Once you are home, an ice pack applied to your swollen knee may help with discomfort and help decrease swelling.   Keep your knee raised (elevated) when you are not up and around or on crutches.   Only take over-the-counter or prescription medicines for pain, discomfort, or fever as directed by your caregiver.   Your caregivers will help with instructions for rehabilitation of your knee. This often includes strengthening exercises.   You may resume a normal diet and activities as directed.  SEEK MEDICAL CARE IF:   There is increased swelling in your knee.   You notice redness, swelling, or increasing pain in your knee.   An unexplained oral temperature above 102 F (38.9 C) develops.  SEEK IMMEDIATE MEDICAL CARE IF:   You develop a rash.   You have difficulty breathing.   You have any allergic reactions from medications you may have been given.   There is severe pain with any motion of the knee.  MAKE SURE YOU:   Understand these instructions.  Will watch your condition.   Will get help right away if you are not doing well or get worse.  Document Released: 09/07/2003 Document Revised: 02/27/2011 Document Reviewed: 11/11/2007 [facility] Patient Information 2012 [place], [place]. Her x-ray reveals a small knee effusion, as well as osteoarthritis in 3 areas of your right knee.  This will need to be followed by orthopedics.  I have referred you to an orthopod.  He can also be followed up by your primary  care physician, I have written you a prescription for prednisone, which is a powerful anti-inflammatory, which will help with the pain, as well as the swelling

## 2011-08-29 NOTE — ED Notes (Signed)
Patient with right knee pain since earlier today, now complaining of pain and numbness.

## 2011-08-30 NOTE — ED Provider Notes (Signed)
Medical screening examination/treatment/procedure(s) were performed by non-physician practitioner and as supervising physician I was immediately available for consultation/collaboration.   Letesha Klecker M Orvis Stann, DO 08/30/11 0119 

## 2011-10-04 ENCOUNTER — Encounter (HOSPITAL_BASED_OUTPATIENT_CLINIC_OR_DEPARTMENT_OTHER): Payer: Self-pay | Admitting: *Deleted

## 2011-10-04 NOTE — Progress Notes (Signed)
To come in for bmet-ekg-called for notes pcp-to bring cpap dos

## 2011-10-04 NOTE — Progress Notes (Signed)
To come in for ekg-bmet 

## 2011-10-07 ENCOUNTER — Encounter (HOSPITAL_BASED_OUTPATIENT_CLINIC_OR_DEPARTMENT_OTHER)
Admission: RE | Admit: 2011-10-07 | Discharge: 2011-10-07 | Disposition: A | Discharge status: Home / Self Care | Payer: 59 | Source: Ambulatory Visit | Attending: Specialist | Admitting: Specialist

## 2011-10-07 ENCOUNTER — Other Ambulatory Visit: Payer: Self-pay

## 2011-10-08 ENCOUNTER — Encounter (HOSPITAL_BASED_OUTPATIENT_CLINIC_OR_DEPARTMENT_OTHER): Payer: Self-pay | Admitting: Specialist

## 2011-10-08 ENCOUNTER — Ambulatory Visit (HOSPITAL_BASED_OUTPATIENT_CLINIC_OR_DEPARTMENT_OTHER)
Admission: RE | Admit: 2011-10-08 | Discharge: 2011-10-08 | Disposition: A | Discharge status: Home / Self Care | Payer: 59 | Source: Ambulatory Visit | Attending: Specialist | Admitting: Specialist

## 2011-10-08 ENCOUNTER — Encounter (HOSPITAL_BASED_OUTPATIENT_CLINIC_OR_DEPARTMENT_OTHER): Payer: Self-pay | Admitting: *Deleted

## 2011-10-08 ENCOUNTER — Encounter (HOSPITAL_BASED_OUTPATIENT_CLINIC_OR_DEPARTMENT_OTHER)
Admission: RE | Disposition: A | Discharge status: Home / Self Care | Payer: Self-pay | Source: Ambulatory Visit | Attending: Specialist

## 2011-10-08 ENCOUNTER — Encounter (HOSPITAL_BASED_OUTPATIENT_CLINIC_OR_DEPARTMENT_OTHER): Payer: Self-pay | Admitting: Anesthesiology

## 2011-10-08 ENCOUNTER — Ambulatory Visit (HOSPITAL_BASED_OUTPATIENT_CLINIC_OR_DEPARTMENT_OTHER): Payer: 59 | Admitting: Anesthesiology

## 2011-10-08 DIAGNOSIS — G4733 Obstructive sleep apnea (adult) (pediatric): Secondary | ICD-10-CM | POA: Insufficient documentation

## 2011-10-08 DIAGNOSIS — Z01810 Encounter for preprocedural cardiovascular examination: Secondary | ICD-10-CM | POA: Insufficient documentation

## 2011-10-08 DIAGNOSIS — I10 Essential (primary) hypertension: Secondary | ICD-10-CM | POA: Insufficient documentation

## 2011-10-08 DIAGNOSIS — Z91040 Latex allergy status: Secondary | ICD-10-CM | POA: Insufficient documentation

## 2011-10-08 DIAGNOSIS — S83241A Other tear of medial meniscus, current injury, right knee, initial encounter: Secondary | ICD-10-CM

## 2011-10-08 DIAGNOSIS — M1711 Unilateral primary osteoarthritis, right knee: Secondary | ICD-10-CM | POA: Diagnosis present

## 2011-10-08 DIAGNOSIS — E785 Hyperlipidemia, unspecified: Secondary | ICD-10-CM | POA: Insufficient documentation

## 2011-10-08 DIAGNOSIS — Z01812 Encounter for preprocedural laboratory examination: Secondary | ICD-10-CM | POA: Insufficient documentation

## 2011-10-08 DIAGNOSIS — M23329 Other meniscus derangements, posterior horn of medial meniscus, unspecified knee: Secondary | ICD-10-CM | POA: Insufficient documentation

## 2011-10-08 HISTORY — DX: Depression, unspecified: F32.A

## 2011-10-08 HISTORY — DX: Nonspecific reaction to tuberculin skin test without active tuberculosis: R76.11

## 2011-10-08 HISTORY — DX: Major depressive disorder, single episode, unspecified: F32.9

## 2011-10-08 SURGERY — ARTHROSCOPY, KNEE, WITH MEDIAL MENISCECTOMY
Anesthesia: General | Site: Knee | Laterality: Right | Wound class: Clean

## 2011-10-08 MED ORDER — GLYCOPYRROLATE 0.2 MG/ML IJ SOLN
INTRAMUSCULAR | Status: DC | PRN
  Administered 2011-10-08: 0.2 mg via INTRAVENOUS

## 2011-10-08 MED ORDER — MIDAZOLAM HCL 2 MG/2ML IJ SOLN
1.0000 mg | INTRAMUSCULAR | Status: DC | PRN
  Administered 2011-10-08: 1 mg via INTRAVENOUS

## 2011-10-08 MED ORDER — FENTANYL CITRATE 0.05 MG/ML IJ SOLN
INTRAMUSCULAR | Status: DC | PRN
  Administered 2011-10-08: 50 ug via INTRAVENOUS
  Administered 2011-10-08 (×2): 25 ug via INTRAVENOUS

## 2011-10-08 MED ORDER — HYDROCODONE-ACETAMINOPHEN 5-325 MG PO TABS: 1.0000 | ORAL_TABLET | Freq: Four times a day (QID) | ORAL | Status: AC | PRN

## 2011-10-08 MED ORDER — EPHEDRINE SULFATE 50 MG/ML IJ SOLN
INTRAMUSCULAR | Status: DC | PRN
  Administered 2011-10-08: 10 mg via INTRAVENOUS

## 2011-10-08 MED ORDER — ONDANSETRON HCL 4 MG/2ML IJ SOLN: 4.0000 mg | Freq: Four times a day (QID) | INTRAMUSCULAR | Status: DC | PRN

## 2011-10-08 MED ORDER — BUPIVACAINE-EPINEPHRINE PF 0.5-1:200000 % IJ SOLN
INTRAMUSCULAR | Status: DC | PRN
  Administered 2011-10-08: 15 mL

## 2011-10-08 MED ORDER — LACTATED RINGERS IV SOLN
INTRAVENOUS | Status: DC
  Administered 2011-10-08 (×2): via INTRAVENOUS

## 2011-10-08 MED ORDER — SODIUM CHLORIDE 0.9 % IR SOLN
Status: DC | PRN
  Administered 2011-10-08: 6000 mL

## 2011-10-08 MED ORDER — PROPOFOL 10 MG/ML IV EMUL
INTRAVENOUS | Status: DC | PRN
  Administered 2011-10-08: 150 mg via INTRAVENOUS

## 2011-10-08 MED ORDER — BUPIVACAINE-EPINEPHRINE 0.5% -1:200000 IJ SOLN
INTRAMUSCULAR | Status: DC | PRN
  Administered 2011-10-08: 15 mL

## 2011-10-08 MED ORDER — DEXAMETHASONE SODIUM PHOSPHATE 4 MG/ML IJ SOLN
INTRAMUSCULAR | Status: DC | PRN
  Administered 2011-10-08: 10 mg via INTRAVENOUS

## 2011-10-08 MED ORDER — ONDANSETRON HCL 4 MG/2ML IJ SOLN
INTRAMUSCULAR | Status: DC | PRN
  Administered 2011-10-08: 4 mg via INTRAVENOUS

## 2011-10-08 MED ORDER — LIDOCAINE HCL (CARDIAC) 20 MG/ML IV SOLN
INTRAVENOUS | Status: DC | PRN
  Administered 2011-10-08: 50 mg via INTRAVENOUS

## 2011-10-08 MED ORDER — HYDROMORPHONE HCL PF 1 MG/ML IJ SOLN: 0.2500 mg | INTRAMUSCULAR | Status: DC | PRN

## 2011-10-08 MED ORDER — FENTANYL CITRATE 0.05 MG/ML IJ SOLN
50.0000 ug | INTRAMUSCULAR | Status: DC | PRN
  Administered 2011-10-08: 50 ug via INTRAVENOUS

## 2011-10-08 SURGICAL SUPPLY — 52 items
APL SKNCLS STERI-STRIP NONHPOA (GAUZE/BANDAGES/DRESSINGS)
BANDAGE ELASTIC 4 VELCRO ST LF (GAUZE/BANDAGES/DRESSINGS) ×2 IMPLANT
BANDAGE ELASTIC 6 VELCRO ST LF (GAUZE/BANDAGES/DRESSINGS) ×2 IMPLANT
BANDAGE ESMARK 6X9 LF (GAUZE/BANDAGES/DRESSINGS) IMPLANT
BENZOIN TINCTURE PRP APPL 2/3 (GAUZE/BANDAGES/DRESSINGS) IMPLANT
BLADE 4.2CUDA (BLADE) IMPLANT
BLADE CUDA SHAVER 3.5 (BLADE) IMPLANT
BLADE CUTTER GATOR 3.5 (BLADE) IMPLANT
BLADE GREAT WHITE 4.2 (BLADE) IMPLANT
BNDG ESMARK 6X9 LF (GAUZE/BANDAGES/DRESSINGS)
BUR OVAL 6.0 (BURR) IMPLANT
CANISTER OMNI JUG 16 LITER (MISCELLANEOUS) IMPLANT
CANISTER SUCTION 2500CC (MISCELLANEOUS) IMPLANT
CANNULA CANNULOC THREADED (CANNULA) IMPLANT
CLOTH BEACON ORANGE TIMEOUT ST (SAFETY) ×2 IMPLANT
DRAPE ARTHROSCOPY W/POUCH 114 (DRAPES) ×2 IMPLANT
DRSG EMULSION OIL 3X3 NADH (GAUZE/BANDAGES/DRESSINGS) ×2 IMPLANT
DRSG PAD ABDOMINAL 8X10 ST (GAUZE/BANDAGES/DRESSINGS) IMPLANT
DURAPREP 26ML APPLICATOR (WOUND CARE) ×2 IMPLANT
ELECT MENISCUS 165MM 90D (ELECTRODE) IMPLANT
ELECT REM PT RETURN 9FT ADLT (ELECTROSURGICAL)
ELECTRODE ANGLED 90DEG 4MM (MISCELLANEOUS) IMPLANT
ELECTRODE REM PT RTRN 9FT ADLT (ELECTROSURGICAL) IMPLANT
GAUZE SPONGE 4X4 16PLY XRAY LF (GAUZE/BANDAGES/DRESSINGS) IMPLANT
GLOVE BIO SURGEON STRL SZ7 (GLOVE) IMPLANT
GLOVE BIOGEL PI IND STRL 7.0 (GLOVE) ×2 IMPLANT
GLOVE BIOGEL PI IND STRL 7.5 (GLOVE) IMPLANT
GLOVE BIOGEL PI IND STRL 9 (GLOVE) ×1 IMPLANT
GLOVE BIOGEL PI INDICATOR 7.0 (GLOVE) ×2
GLOVE BIOGEL PI INDICATOR 7.5 (GLOVE)
GLOVE BIOGEL PI INDICATOR 9 (GLOVE) ×1
GLOVE SKINSENSE N 8.5 STRL (GLOVE) ×2 IMPLANT
GLOVE SKINSENSE NS SZ6.5 (GLOVE) ×1
GLOVE SKINSENSE STRL SZ6.5 (GLOVE) ×1 IMPLANT
GOWN PREVENTION PLUS XLARGE (GOWN DISPOSABLE) ×2 IMPLANT
GOWN PREVENTION PLUS XXLARGE (GOWN DISPOSABLE) ×2 IMPLANT
KNEE WRAP E Z 3 GEL PACK (MISCELLANEOUS) ×2 IMPLANT
PACK ARTHROSCOPY DSU (CUSTOM PROCEDURE TRAY) ×2 IMPLANT
PACK BASIN DAY SURGERY FS (CUSTOM PROCEDURE TRAY) ×2 IMPLANT
PAD ARMBOARD 7.5X6 YLW CONV (MISCELLANEOUS) ×2 IMPLANT
PADDING CAST COTTON 6X4 STRL (CAST SUPPLIES) ×2 IMPLANT
PENCIL BUTTON HOLSTER BLD 10FT (ELECTRODE) IMPLANT
RESECTOR FULL RADIUS 4.2MM (BLADE) IMPLANT
SET ARTHROSCOPY TUBING (MISCELLANEOUS) ×2
SET ARTHROSCOPY TUBING LN (MISCELLANEOUS) ×1 IMPLANT
SPONGE GAUZE 4X4 12PLY (GAUZE/BANDAGES/DRESSINGS) ×2 IMPLANT
STRIP CLOSURE SKIN 1/2X4 (GAUZE/BANDAGES/DRESSINGS) IMPLANT
SUT ETHILON 4 0 PS 2 18 (SUTURE) ×2 IMPLANT
SUT VIC AB 3-0 FS2 27 (SUTURE) ×2 IMPLANT
SYR 50ML LL SCALE MARK (SYRINGE) IMPLANT
TOWEL OR 17X24 6PK STRL BLUE (TOWEL DISPOSABLE) ×2 IMPLANT
WATER STERILE IRR 1000ML POUR (IV SOLUTION) ×2 IMPLANT

## 2011-10-08 NOTE — Brief Op Note (Signed)
10/08/2011  9:51 AM  PATIENT:  Diana [name]  61 y.o. female  PRE-OPERATIVE DIAGNOSIS:  Right medial meniscus tear  POST-OPERATIVE DIAGNOSIS:  Right medial meniscus tear  PROCEDURE:  Procedure(s) (LRB): KNEE ARTHROSCOPY WITH PARTIAL MEDIAL MENISECTOMY (Right)  SURGEON:  Surgeon(s) and Role:    * Kerrin Champagne, MD - Primary   ANESTHESIA:   local and general  EBL:  Total I/O In: 1100 [I.V.:1100] Out: -   BLOOD ADMINISTERED:none  DRAINS: none   LOCAL MEDICATIONS USED:  MARCAINE    and Amount: 15 ml  SPECIMEN:  No Specimen  DISPOSITION OF SPECIMEN:  N/A  COUNTS:  yes  TOURNIQUET:  * Missing tourniquet times found for documented tourniquets in log:  [id number] *  DICTATION: .Dragon Dictation  PLAN OF CARE: Discharge to home after PACU  PATIENT DISPOSITION:  PACU - hemodynamically stable.   Delay start of Pharmacological VTE agent (>24hrs) due to surgical blood loss or risk of bleeding: no

## 2011-10-08 NOTE — Transfer of Care (Signed)
Immediate Anesthesia Transfer of Care Note  Patient: Diana [name]  Procedure(s) Performed: Procedure(s) (LRB): KNEE ARTHROSCOPY WITH MEDIAL MENISECTOMY (Right)  Patient Location: PACU  Anesthesia Type: GA combined with regional for post-op pain  Level of Consciousness: sedated  Airway & Oxygen Therapy: Patient Spontanous Breathing and Patient connected to [name] mask oxygen  Post-op Assessment: Report given to PACU RN and Post -op Vital signs reviewed and stable  Post vital signs: Reviewed and stable  Complications: No apparent anesthesia complications

## 2011-10-08 NOTE — Discharge Instructions (Addendum)
Meniscus Injury of the Knee, Arthroscopy You may have an internal derangement of the knee. This means something is wrong inside the knee. Your caregiver can make a more accurate diagnosis (learning what is wrong) by performing an arthroscopic procedure. Your knee has two layers of cartilage. Articular cartilage covers the bone ends. It lets your knee bend and move smoothly. Two menisci (thick pads of cartilage that form a rim inside the joint) help absorb shock. They stabilize your knee. Ligaments bind the bones together. They support your knee joint. Muscles move the joint, help support your knee, and take stress off the joint itself.  ABOUT THE PROCEDURE Arthroscopy is a surgical technique. It allows your orthopedic surgeon to diagnose and treat your knee injury with accuracy. The surgeon looks into your knee through a small scope. The scope is like a small (pencil-sized) telescope. Arthroscopy is less invasive than open knee surgery. You can expect a more rapid recovery. Following your caregiver's instructions will help you recover rapidly and completely. Use crutches, rest, elevate, ice, and do knee exercises as instructed. The length of recovery depends on various factors. These factors include type of injury, age, physical condition, medical conditions, and your determination. How long you will be away from your normal activities will depend on what kind of knee problem you have. It will also depend on how much tissue is damaged. Rebuilding your muscles after arthroscopy helps ensure a full recovery. RECOVERY Recovery after a meniscus injury depends on how much meniscus is damaged. It also depends on whether or not you have damaged other knee tissue. With small tears, your recovery may take a couple weeks. Larger tears will take longer. Meniscus injuries can usually be treated during arthroscopy. If your injury is on the inner edge of the meniscus, your surgeon may trim the meniscus back to a smooth rim.  In other cases, your surgeon will try to repair a damaged meniscus with sutures (stitches). This may lengthen your rehabilitation. It may provide better long-term health by helping your knee retain its shock absorption abilities. Use crutches, limit weight bearing, rest, elevate, apply ice, and exercise your knee as instructed. If a brace is applied, use as directed. The length of recovery depends on various factors including type of injury, age, physical condition, other medical conditions, and your determination. Your caregiver will help with instructions for rehabilitation of your knee. HOME CARE INSTRUCTIONS  Use crutches and knee exercises as instructed.   Applying an ice pack to your operative site may help with discomfort. It may also keep the swelling down.   Only take over-the-counter or prescription medicines for pain, discomfort, inflammation (soreness)or fever as directed by your caregiver. You may use these only if your caregiver has not given medications that would interfere.   You may resume normal diet and activities as directed.  SEEK MEDICAL ATTENTION IF:  There is increased bleeding (more than a small spot) from the wound.   You notice redness, swelling, or increasing pain in the wound.   Pus is coming from wound.   An unexplained oral temperature above 102 F (38.9 C) develops, or as your caregiver suggests.   You notice a foul smell coming from the wound or dressing.  SEEK IMMEDIATE MEDICAL CARE IF:  You develop a rash.   You have difficulty breathing.   You have any allergic problems.  Document Released: 06/14/2000 Document Revised: 06/06/2011 Document Reviewed: 08/31/2007 [facility] Patient Information 2012 [place], [place].Arthroscopic Procedure, Knee An arthroscopic procedure can find what  is wrong with your knee. PROCEDURE Arthroscopy is a surgical technique that allows your orthopedic surgeon to diagnose and treat your knee injury with accuracy. They will  look into your knee through a small instrument. This is almost like a small (pencil sized) telescope. Because arthroscopy affects your knee less than open knee surgery, you can anticipate a more rapid recovery. Taking an active role by following your caregiver's instructions will help with rapid and complete recovery. Use crutches, rest, elevation, ice, and knee exercises as instructed. The length of recovery depends on various factors including type of injury, age, physical condition, medical conditions, and your rehabilitation. Your knee is the joint between the large bones (femur and tibia) in your leg. Cartilage covers these bone ends which are smooth and slippery and allow your knee to bend and move smoothly. Two menisci, thick, semi-lunar shaped pads of cartilage which form a rim inside the joint, help absorb shock and stabilize your knee. Ligaments bind the bones together and support your knee joint. Muscles move the joint, help support your knee, and take stress off the joint itself. Because of this all programs and physical therapy to rehabilitate an injured or repaired knee require rebuilding and strengthening your muscles. AFTER THE PROCEDURE After the procedure, you will be moved to a recovery area until most of the effects of the medication have worn off. Your caregiver will discuss the test results with you.  Only take over-the-counter or prescription medicines for pain, discomfort, or fever as directed by your caregiver.  SEEK MEDICAL CARE IF:  You have increased bleeding from your wounds.  You see redness, swelling, or have increasing pain in your wounds.  You have pus coming from your wound.  You have an oral temperature above 102 F (38.9 C).  You notice a bad smell coming from the wound or dressing.  You have severe pain with any motion of your knee.  SEEK IMMEDIATE MEDICAL CARE IF:  You develop a rash.  You have difficulty breathing.  You have any allergic problems.  Document  Released: 06/14/2000 Document Revised: 06/06/2011 Document Reviewed: 01/06/2008 [employer] Patient Informat[employer]      Post Anesthesia Home Care Instructions  Activity: Get plenty of rest for the remainder of the day. A responsible adult should stay with you for 24 hours following the procedure.  For the next 24 hours, DO NOT: -Drive a car -Advertising copywriter -Drink alcoholic beverages -Take any medication unless instructed by your physician -Make any legal decisions or sign important papers.  Meals: Start with liquid foods such as gelatin or soup. Progress to regular foods as tolerated. Avoid greasy, spicy, heavy foods. If nausea and/or vomiting occur, drink only clear liquids until the nausea and/or vomiting subsides. Call your physician if vomiting continues.  Special Instructions/Symptoms: Your throat may feel dry or sore from the anesthesia or the breathing tube placed in your throat during surgery. If this causes discomfort, gargle with warm salt water. The discomfort should disappear within 24 hours.  n [address], [place].Regional Anesthesia Blocks  1. Numbness or the inability to move the "blocked" extremity may last from 3-48 hours after placement. The length of time depends on the medication injected and your individual response to the medication. If the numbness is not going away after 48 hours, call your surgeon.  2. The extremity that is blocked will need to be protected until the numbness is gone and the  Strength has returned. Because you cannot feel it, you will need to take  extra care to avoid injury. Because it may be weak, you may have difficulty moving it or using it. You may not know what position it is in without looking at it while the block is in effect.  3. For blocks in the legs and feet, returning to weight bearing and walking needs to be done carefully. You will need to wait until the numbness is entirely gone and the strength has returned. You should be able to  move your leg and foot normally before you try and bear weight or walk. You will need someone to be with you when you first try to ensure you do not fall and possibly risk injury.  4. Bruising and tenderness at the needle site are common side effects and will resolve in a few days.  5. Persistent numbness or new problems with movement should be communicated to the surgeon or the [facility] [phone] [facility] [phone]).

## 2011-10-08 NOTE — Anesthesia Preprocedure Evaluation (Signed)
Anesthesia Evaluation  Patient identified by MRN, date of birth, ID band Patient awake    Reviewed: Allergy & Precautions, H&P , NPO status , Patient's Chart, lab work & pertinent test results  Airway Mallampati: II  Neck ROM: full    Dental   Pulmonary sleep apnea , former smoker         Cardiovascular hypertension,     Neuro/Psych    GI/Hepatic   Endo/Other    Renal/GU      Musculoskeletal  (+) Arthritis -, Rheumatoid disorders,    Abdominal   Peds  Hematology   Anesthesia Other Findings   Reproductive/Obstetrics                           Anesthesia Physical Anesthesia Plan  ASA: II  Anesthesia Plan: General   Post-op Pain Management:    Induction: Intravenous  Airway Management Planned: LMA  Additional Equipment:   Intra-op Plan:   Post-operative Plan:   Informed Consent: I have reviewed the patients History and Physical, chart, labs and discussed the procedure including the risks, benefits and alternatives for the proposed anesthesia with the patient or authorized representative who has indicated his/her understanding and acceptance.     Plan Discussed with: CRNA and Surgeon  Anesthesia Plan Comments:         Anesthesia Quick Evaluation

## 2011-10-08 NOTE — Progress Notes (Signed)
Assisted Dr. Hodierne with right, knee block. Side rails up, monitors on throughout procedure. See vital signs in flow sheet. Tolerated Procedure well. 

## 2011-10-08 NOTE — H&P (Signed)
Diana [name] is an 61 y.o. female.   Chief Complaint: Right knee pain  HPI: 61 year old female with 2 month history of increasing mechanical symptoms and pain and Swelling right knee. Complains of popping catching and occasional locking sensation with Recurring episodes of right knee swelling. MRI  Shows meniscal tear right knee posterior horn.  Past Medical History  Diagnosis Date  . Hypertension   . Hyperlipidemia   . Rheumatoid arthritis   . Sleep apnea     uses cpap-mod-severe  . Positive PPD     works Engineer, maintenance (IT)  . Depression     Past Surgical History  Procedure Date  . Kidney surgery   . Nephrectomy 2007    right  . Ankle arthrodesis 2010    left  . Tubal ligation   . Cervical biopsy   . Breast surgery     rt breast bx  . Breast lumpectomy 1998    rt    History reviewed. No pertinent family history. Social History:  reports that she quit smoking about 16 years ago. She does not have any smokeless tobacco history on file. She reports that she does not drink alcohol or use illicit drugs.  Allergies:  Allergies  Allergen Reactions  . Latex   . Sulfa Antibiotics     Medications Prior to Admission  Medication Dose Route Frequency Provider Last Rate Last Dose  . fentaNYL (SUBLIMAZE) injection 50-100 mcg  50-100 mcg Intravenous PRN Raiford Simmonds, MD   50 mcg at 10/08/11 0829  . lactated ringers infusion   Intravenous Continuous Bedelia Person, MD 10 mL/hr at 10/08/11 1610    . midazolam (VERSED) injection 1-2 mg  1-2 mg Intravenous PRN Raiford Simmonds, MD   1 mg at 10/08/11 9604   Medications Prior to Admission  Medication Sig Dispense Refill  . FLUoxetine (PROZAC) 10 MG capsule Take 10 mg by mouth daily.      . Multiple Vitamins-Minerals (CENTRUM SILVER PO) Take 1 tablet by mouth daily.      . naproxen sodium (ANAPROX) 220 MG tablet Take 440 mg by mouth 2 (two) times daily with a meal.      . OVER THE COUNTER MEDICATION Take 1 tablet by mouth 2 (two) times daily.  Nature's Own Hair, Skin, and Nails      . telmisartan (MICARDIS) 80 MG tablet Take 80 mg by mouth daily.        Results for orders placed during the hospital encounter of 10/08/11 (from the past 48 hour(s))  POCT HEMOGLOBIN-HEMACUE     Status: Normal   Collection Time   10/08/11  8:17 AM      Component Value Range Comment   Hemoglobin 12.0  12.0 - 15.0 (g/dL)    No results found.  Review of Systems  Constitutional: Negative.  Negative for fever, chills, weight loss, malaise/fatigue and diaphoresis.  HENT: Negative.  Negative for hearing loss, ear pain, nosebleeds, congestion, sore throat, neck pain, tinnitus and ear discharge.   Eyes: Negative.  Negative for blurred vision, double vision, photophobia, pain, discharge and redness.  Respiratory: Negative.  Negative for cough, hemoptysis, sputum production, shortness of breath, wheezing and stridor.   Cardiovascular: Negative.  Negative for chest pain, palpitations, orthopnea, claudication, leg swelling and PND.  Gastrointestinal: Negative.  Negative for heartburn, nausea, vomiting, abdominal pain, diarrhea, constipation, blood in stool and melena.  Genitourinary: Negative.  Negative for dysuria, urgency, frequency, hematuria and flank pain.  Musculoskeletal: Positive for joint  pain. Negative for myalgias, back pain and falls.  Skin: Negative.  Negative for itching and rash.  Neurological: Positive for focal weakness. Negative for dizziness, tingling, tremors, sensory change, speech change, seizures, loss of consciousness, weakness and headaches.  Endo/Heme/Allergies: Negative.  Negative for environmental allergies and polydipsia. Does not bruise/bleed easily.  Psychiatric/Behavioral: Negative.  Negative for depression, suicidal ideas, hallucinations, memory loss and substance abuse. The patient is not nervous/anxious and does not have insomnia.     Blood pressure 98/56, pulse 65, temperature 97.7 F (36.5 C), temperature source Oral, resp.  rate 20, height 5\' 2"  (1.575 m), weight 74.39 kg (164 lb), SpO2 93.00%. Physical Exam  Constitutional: She is oriented to person, place, and time. She appears well-developed and well-nourished. No distress.  HENT:  Head: Normocephalic and atraumatic.  Eyes: Conjunctivae are normal. Pupils are equal, round, and reactive to light. Right eye exhibits no discharge. Left eye exhibits no discharge. No scleral icterus.  Neck: Neck supple. No JVD present. No tracheal deviation present. No thyromegaly present.  Cardiovascular: Normal rate, regular rhythm, normal heart sounds and intact distal pulses.  Exam reveals no gallop and no friction rub.   No murmur heard. Respiratory: Effort normal and breath sounds normal. No stridor. No respiratory distress. She has no wheezes. She has no rales. She exhibits no tenderness.  GI: Soft. Bowel sounds are normal. She exhibits no distension and no mass. There is no tenderness. There is no rebound and no guarding.  Musculoskeletal: She exhibits tenderness. She exhibits no edema.  Lymphadenopathy:    She has no cervical adenopathy.  Neurological: She is alert and oriented to person, place, and time. She displays abnormal reflex. No cranial nerve deficit. She exhibits normal muscle tone. Coordination normal.  Skin: Skin is warm and dry. No rash noted. She is not diaphoretic. No erythema. No pallor.  Psychiatric: She has a normal mood and affect. Her behavior is normal. Judgment and thought content normal.   Ortho exam: 2+ effusion, pain with full extension and pain with flexion positive McMurray's sign medial joint line with  Join line tenderness.  MRI: Positive for horizontal tear medial meniscus posterior horn, Tricompartment osteoarthritis.  Assessment/Plan Acute right knee medial meniscus tear on chronic Osteoarthritis tricompartment Single kidney Latex allergy Hypertention  Plan: Right knee Arthroscopy with partial medial meniscectomy. The patient has been  re-examined, and the chart reviewed, and there have been no interval changes to the documented history and physical.    The risks, benefits, and alternatives have been discussed at length, and the patient is willing to proceed.     Jadesola Poynter E 10/08/2011, 8:39 AM

## 2011-10-08 NOTE — Anesthesia Postprocedure Evaluation (Signed)
Anesthesia Post Note  Patient: Diana [name]  Procedure(s) Performed: Procedure(s) (LRB): KNEE ARTHROSCOPY WITH MEDIAL MENISECTOMY (Right)  Anesthesia type: General  Patient location: PACU  Post pain: Pain level controlled and Adequate analgesia  Post assessment: Post-op Vital signs reviewed, Patient's Cardiovascular Status Stable, Respiratory Function Stable, Patent Airway and Pain level controlled  Last Vitals:  Filed Vitals:   10/08/11 1015  BP: 120/66  Pulse: 78  Temp:   Resp: 12    Post vital signs: Reviewed and stable  Level of consciousness: awake, alert  and oriented  Complications: No apparent anesthesia complications

## 2011-10-08 NOTE — Anesthesia Procedure Notes (Addendum)
  Narrative:    Procedure Name: LMA Insertion Date/Time: 10/08/2011 8:46 AM Performed by: Gar Gibbon Pre-anesthesia Checklist: Patient identified, Emergency Drugs available, Suction available and Patient being monitored Patient Re-evaluated:Patient Re-evaluated prior to inductionOxygen Delivery Method: Circle System Utilized Preoxygenation: Pre-oxygenation with 100% oxygen Intubation Type: IV induction Ventilation: Mask ventilation without difficulty LMA: LMA inserted LMA Size: 4.0 Number of attempts: 1 Airway Equipment and Method: bite block Placement Confirmation: positive ETCO2 Tube secured with: Tape Dental Injury: Teeth and Oropharynx as per pre-operative assessment

## 2011-10-08 NOTE — Op Note (Signed)
10/08/2011  10:05 AM  PATIENT:  Diana [name]  61 y.o. female  MRN: [id number]  OPERATIVE REPORT  PRE-OPERATIVE DIAGNOSIS:  Right medial meniscus tear  POST-OPERATIVE DIAGNOSIS:  Right medial meniscus tear  PROCEDURE:  Procedure(s): KNEE ARTHROSCOPY WITH MEDIAL MENISECTOMY    SURGEON:  Kerrin Champagne, MD     ANESTHESIA:  General,  Dr. Chaney Malling, Local Block and EMLA    COMPLICATIONS:  None.      TOURNIQUET TIME: 0 MIN  PROCEDURE: The patient was met in the holding area, and the appropriate right knee identified and marked with an "X" and my initials. The patient received a  Preoperative right knee periarticular and intra-articular block by anesthesia.     The patient was then transported to OR and was placed on the operative table in a supine position. The patient was then placed under  general anesthesia without difficulty. The right leg placed into a leg holder a tourniquet about the right upper thigh  Leg was then prepped using sterile conditions and draped using sterile technique.  Time-out procedure was called and correct. Landmarks right knee with a marked using a sterile lower marking pen. Infiltration of the knee joint instillation of 20 cc of irrigant solution plate we. She can gauge spinal needle used to identify the inferior portal site for the lateral joint line. 11 blade scalpel a stab incision blunt spread into the knee joint using a small inflow cannula with its trocar. The OR arthroscopy sleeve was then inserted into the knee via a lateral inferior anterior portal site. Inflow at 80 mmHg. Pressure. Arthroscopy then begun with the examination of the medial recess extensive synovitis noted. Medial joint line identifying the tear posterior horn medial meniscus is obvious. Tear was  complex at the medial aspect of the posterior rim and extended over the posterior horn in a horizontal fashion. Grade 3 and grade 4 changes along the medial femoral condyle overlying the area of her  meniscus tear. A second stab incision was made first localizing with an 18-gauge spinal needle entry point into the anterior inferior medial portal site. An incision 11 blade scalpel then a inflow cannula the trocar intact inserted into the medial joint line. 3.5 full radius shaver was then inserted through this medial inferior anterior portal and used to shave the torn meniscus. This was continued along the posterior rim resecting areas of horizontal cleavage thinning superiorly leaving the inferior portion of the meniscus intact shaved smooth. A probe was then inserted and the posterior horn of the medial meniscus evaluated and is stable and well attached. Documentation of the total meniscus and the appearance of the meniscus following partial resection with photomicrographs. The intercondylar notch evaluated and the anterior cruciate ligament to be intact. Shaving was then performed the medial femoral condyle over the area adjacent to the previous meniscal injury. Also an area of the weightbearing surface of the distal portion of the medial femoral condyle also shaved with a 3.5 full-radius shaver. Following this irrigation was performed using copious amounts of irrigant solution. Based on preoperative MRI scan the lateral joint line and patellofemoral joint line appreciable pathology. Following irrigation the joint was expressed of any serious solution and the inflow cannula removed. Each of the portals and closed with a single subcutaneous suture of 3-0 Vicryl in a vertical mattress suture of 3-0 nylon. Dry dressing of Adaptic 4 x 4's ABDs pads fixed to the skin with sterile Webril. Ace wrap then applied. All instrument and sponge counts  were correct. Patient was then reactivated extubated and returned to recovery room in satisfactory condition.       Lemoyne Nestor E  10/08/2011, 10:05 AM

## 2012-04-02 ENCOUNTER — Other Ambulatory Visit: Payer: Self-pay | Admitting: Obstetrics and Gynecology

## 2012-04-02 DIAGNOSIS — Z1231 Encounter for screening mammogram for malignant neoplasm of breast: Secondary | ICD-10-CM

## 2012-06-08 ENCOUNTER — Ambulatory Visit: Payer: 59

## 2012-06-26 ENCOUNTER — Ambulatory Visit
Admission: RE | Admit: 2012-06-26 | Discharge: 2012-06-26 | Disposition: A | Discharge status: Home / Self Care | Payer: 59 | Source: Ambulatory Visit | Attending: Obstetrics and Gynecology | Admitting: Obstetrics and Gynecology

## 2012-06-26 DIAGNOSIS — Z1231 Encounter for screening mammogram for malignant neoplasm of breast: Secondary | ICD-10-CM

## 2015-01-16 ENCOUNTER — Other Ambulatory Visit: Payer: Self-pay | Admitting: Obstetrics and Gynecology

## 2015-01-17 LAB — CYTOLOGY - PAP

## 2015-02-13 ENCOUNTER — Other Ambulatory Visit (HOSPITAL_COMMUNITY): Payer: Self-pay | Admitting: Rheumatology

## 2015-02-13 ENCOUNTER — Other Ambulatory Visit: Payer: Self-pay

## 2015-02-13 ENCOUNTER — Ambulatory Visit (HOSPITAL_COMMUNITY)
Admission: RE | Admit: 2015-02-13 | Discharge: 2015-02-13 | Disposition: A | Discharge status: Home / Self Care | Payer: Medicare Other | Source: Ambulatory Visit | Attending: Rheumatology | Admitting: Rheumatology

## 2015-02-13 DIAGNOSIS — Z9225 Personal history of immunosupression therapy: Secondary | ICD-10-CM | POA: Diagnosis not present

## 2015-02-13 DIAGNOSIS — Z1231 Encounter for screening mammogram for malignant neoplasm of breast: Secondary | ICD-10-CM

## 2015-02-15 ENCOUNTER — Ambulatory Visit
Admission: RE | Admit: 2015-02-15 | Discharge: 2015-02-15 | Disposition: A | Discharge status: Home / Self Care | Payer: Medicare Other | Source: Ambulatory Visit

## 2015-02-15 DIAGNOSIS — Z1231 Encounter for screening mammogram for malignant neoplasm of breast: Secondary | ICD-10-CM

## 2015-02-17 ENCOUNTER — Other Ambulatory Visit: Payer: Self-pay | Admitting: Obstetrics and Gynecology

## 2015-02-17 DIAGNOSIS — R928 Other abnormal and inconclusive findings on diagnostic imaging of breast: Secondary | ICD-10-CM

## 2015-03-08 ENCOUNTER — Ambulatory Visit
Admission: RE | Admit: 2015-03-08 | Discharge: 2015-03-08 | Disposition: A | Discharge status: Home / Self Care | Payer: Medicare Other | Source: Ambulatory Visit | Attending: Obstetrics and Gynecology | Admitting: Obstetrics and Gynecology

## 2015-03-08 DIAGNOSIS — R928 Other abnormal and inconclusive findings on diagnostic imaging of breast: Secondary | ICD-10-CM

## 2016-05-03 ENCOUNTER — Telehealth: Payer: Self-pay | Admitting: Rheumatology

## 2016-05-03 NOTE — Telephone Encounter (Signed)
Patient is requesting refill of Humira. Patient states she receives rx through the assistance program and they need an authorization.

## 2016-05-03 NOTE — Telephone Encounter (Signed)
Last visit 12/08/15 Next visit 05/17/16 Labs 03/01/16 TB gold  Neg 03/01/16  Ok to refill per Dr Corliss Skains

## 2016-05-14 DIAGNOSIS — I10 Essential (primary) hypertension: Secondary | ICD-10-CM | POA: Insufficient documentation

## 2016-05-14 DIAGNOSIS — F32A Depression, unspecified: Secondary | ICD-10-CM | POA: Insufficient documentation

## 2016-05-14 DIAGNOSIS — D75A Glucose-6-phosphate dehydrogenase (G6PD) deficiency without anemia: Secondary | ICD-10-CM | POA: Insufficient documentation

## 2016-05-14 DIAGNOSIS — M19041 Primary osteoarthritis, right hand: Secondary | ICD-10-CM | POA: Insufficient documentation

## 2016-05-14 DIAGNOSIS — Z905 Acquired absence of kidney: Secondary | ICD-10-CM | POA: Insufficient documentation

## 2016-05-14 DIAGNOSIS — M19042 Primary osteoarthritis, left hand: Secondary | ICD-10-CM

## 2016-05-14 DIAGNOSIS — F329 Major depressive disorder, single episode, unspecified: Secondary | ICD-10-CM | POA: Insufficient documentation

## 2016-05-14 DIAGNOSIS — Z87891 Personal history of nicotine dependence: Secondary | ICD-10-CM | POA: Insufficient documentation

## 2016-05-14 DIAGNOSIS — M170 Bilateral primary osteoarthritis of knee: Secondary | ICD-10-CM | POA: Insufficient documentation

## 2016-05-14 DIAGNOSIS — M0579 Rheumatoid arthritis with rheumatoid factor of multiple sites without organ or systems involvement: Secondary | ICD-10-CM | POA: Insufficient documentation

## 2016-05-14 DIAGNOSIS — Z8781 Personal history of (healed) traumatic fracture: Secondary | ICD-10-CM | POA: Insufficient documentation

## 2016-05-14 DIAGNOSIS — Z79899 Other long term (current) drug therapy: Secondary | ICD-10-CM | POA: Insufficient documentation

## 2016-05-14 DIAGNOSIS — G4730 Sleep apnea, unspecified: Secondary | ICD-10-CM | POA: Insufficient documentation

## 2016-05-14 NOTE — Progress Notes (Deleted)
Office Visit Note  Patient: Diana [name]             Date of Birth: 08-19-1950           MRN: [id number]             PCP: York Ram, M.D. Referring: Sharyne Richters, M.D. Visit Date: 05/17/2016 Occupation: Retired Charity fundraiser    Subjective:  No chief complaint on file.   History of Present Illness: Diana [name] is a 65 y.o. right-handed female ***   Activities of Daily Living:  Patient reports morning stiffness for *** {minute/hour:19697}.   Patient {ACTIONS;DENIES/REPORTS:21021675::"Denies"} nocturnal pain.  Difficulty dressing/grooming: {ACTIONS;DENIES/REPORTS:21021675::"Denies"} Difficulty climbing stairs: {ACTIONS;DENIES/REPORTS:21021675::"Denies"} Difficulty getting out of chair: {ACTIONS;DENIES/REPORTS:21021675::"Denies"} Difficulty using hands for taps, buttons, cutlery, and/or writing: {ACTIONS;DENIES/REPORTS:21021675::"Denies"}   No Rheumatology ROS completed.   PMFS History:  Patient Active Problem List   Diagnosis Date Noted  . Rheumatoid arthritis with positive rheumatoid factor  05/14/2016  . High risk medication use 05/14/2016  . Primary osteoarthritis of both hands 05/14/2016  . Primary osteoarthritis of both knees 05/14/2016  . G6PD deficiency ([place]) 05/14/2016  . S/p nephrectomy 05/14/2016  . Essential hypertension 05/14/2016  . Depression 05/14/2016  . Sleep apnea 05/14/2016  . Former smoker 05/14/2016  . History of ankle fracture 05/14/2016  . Acute medial meniscus tear of right knee 10/08/2011    Class: Acute  . Osteoarthritis of right knee 10/08/2011    Class: Chronic    Past Medical History:  Diagnosis Date  . Depression   . Hyperlipidemia   . Hypertension   . 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 Rheumatoid arthritis   . Sleep apnea    uses cpap-mod-severe    No family history on file. Past Surgical History:  Procedure Laterality Date  . ANKLE ARTHRODESIS  2010   left  . BREAST LUMPECTOMY  1998   rt  . BREAST  SURGERY     rt breast bx  . CERVICAL BIOPSY    . KIDNEY SURGERY    . NEPHRECTOMY  2007   right  . TUBAL LIGATION     Social History   Social History Narrative  . No narrative on file     Objective: Vital Signs: There were no vitals taken for this visit.   Physical Exam   Musculoskeletal Exam: ***  CDAI Exam: No CDAI exam completed.    Investigation: Findings:  August 2016: TB negative, hepatitis negative, HIV negative, SPEP normal, immunoglobulins normal, chest x-ray normal, pneumococcal vaccine 2016, zoster vaccine 2013, last x-rays of hands and feet and knee joints on 02/13/2015 02/27/2016 CBC normal, CMP normal GFR 61, lipid panel LDL 162, HDL 48, TB: Negative    Imaging: No results found.  Speciality Comments: No specialty comments available.    Procedures:  No procedures performed Allergies: Latex and Sulfa antibiotics   Assessment / Plan: Visit Diagnoses: Rheumatoid arthritis with positive rheumatoid factor  - +RF,+CCP,high ESR, erosive disease  High risk medication use - Humira, NoNSAIDS or MTX due to h/o nephrectomy  Primary osteoarthritis of both hands  Primary osteoarthritis of both knees - bilateral severe , with severre chondromalacia patella  G6PD deficiency (HCC)  S/p nephrectomy - Right, due to hydronephrosis  Essential hypertension  Depression  Sleep apnea  Former smoker  History of ankle fracture    Orders: No orders of the defined types were placed in this encounter.  No orders of the defined types were placed in this  encounter.   Face-to-face time spent with patient was *** minutes. 50% of time was spent in counseling and coordination of care.  Follow-Up Instructions: No Follow-up on file.   Bo Merino, MD

## 2016-05-17 ENCOUNTER — Ambulatory Visit: Payer: Self-pay | Admitting: Rheumatology

## 2016-05-30 ENCOUNTER — Other Ambulatory Visit: Payer: Self-pay | Admitting: Internal Medicine

## 2016-05-31 ENCOUNTER — Other Ambulatory Visit: Payer: Self-pay | Admitting: Obstetrics and Gynecology

## 2016-05-31 DIAGNOSIS — Z1231 Encounter for screening mammogram for malignant neoplasm of breast: Secondary | ICD-10-CM

## 2016-06-05 ENCOUNTER — Ambulatory Visit
Admission: RE | Admit: 2016-06-05 | Discharge: 2016-06-05 | Disposition: A | Discharge status: Home / Self Care | Payer: Medicare Other | Source: Ambulatory Visit | Attending: Obstetrics and Gynecology | Admitting: Obstetrics and Gynecology

## 2016-06-05 DIAGNOSIS — Z1231 Encounter for screening mammogram for malignant neoplasm of breast: Secondary | ICD-10-CM

## 2016-06-13 NOTE — Progress Notes (Signed)
Office Visit Note  Patient: Diana [name]             Date of Birth: [birth year]           MRN: [id number]             PCP: PROVIDER NOT IN SYSTEM Referring: No ref. provider found Visit Date: 06/18/2016 Occupation: @GUAROCC @    Subjective:  No chief complaint on file. Follow-up on rheumatoid arthritis and high risk prescription that includes Humira but no methotrexate (intentional).  History of Present Illness: Diana [name] is a 65 y.o. female  Last seen 12/08/2015. History of rheumatoid arthritis with positive CCP and positive rheumatoid factor. She has a history of erosive disease. On the last visit she did not have any synovitis. She was off of Humira for approximately one or 2 doses because she had bronchitis back in April and then when she improved she restarted Humira. She did well during the time that she held off of the Humira.  Note the patient only has one kidney. As a result we are avoiding giving her NSAIDs as well as methotrexate.  Patient's last labs are dated 02/27/2016 when the TB go was done and it was negative. CMP with GFR was normal with her kidney function showing a creatinine of 0.97 and GFR of 70. CBC with differential is normal. Lipid panel is normal except for cholesterol elevated at 226 and LDL elevated at 162.  On today's visit: Patient states she is doing well with her RA. No joint pain stiffness or swelling. Using Humira every 2 weeks as prescribed. We gave her right knee brace secondary to pain on the last visit. She states that she is using her brace but finds it to binding a time so she does not use the brace as often. Instead she uses the sleeve brace which gives her some comfort. The sleeve brace plus her compression stockings gives her a fair amount of support. Her right knee still bothers her and she likes to take Pennsaid (Aleve (maybe once every week or every 2 weeks. Her PCP has advised her to avoid the Aleve but patient states that she isn't  significant amount of pain and she uses it sparingly for the occasional discomfort. I advised the patient to discuss this with him as well as an alternative of Voltaren gel. Patient is agreeable. Note that she had MRI of the right knee on 09/04/2011. The MRI showed tricompartmental osteoarthritis, medial meniscal tear, possible mild quadriceps sprain.  Patient states that she'll be traveling to 11/04/2011 approximately March 2018. At that time she will be due for repeat blood work. As a result of asked her to come in February to get her blood work done.  Activities of Daily Living:  Patient reports morning stiffness for 15 minutes.   Patient Denies nocturnal pain.  Difficulty dressing/grooming: Denies Difficulty climbing stairs: Denies Difficulty getting out of chair: Denies Difficulty using hands for taps, buttons, cutlery, and/or writing: Denies   Review of Systems  Constitutional: Negative for fatigue.  HENT: Negative for mouth sores and mouth dryness.   Eyes: Negative for dryness.  Respiratory: Negative for shortness of breath.   Gastrointestinal: Negative for constipation and diarrhea.  Musculoskeletal: Negative for myalgias and myalgias.  Skin: Negative for sensitivity to sunlight.  Psychiatric/Behavioral: Negative for decreased concentration and sleep disturbance.    PMFS History:  Patient Active Problem List   Diagnosis Date Noted  . Rheumatoid arthritis with positive rheumatoid factor  05/14/2016  .  High risk medication use 05/14/2016  . Primary osteoarthritis of both hands 05/14/2016  . Primary osteoarthritis of both knees 05/14/2016  . G6PD deficiency (HCC) 05/14/2016  . S/p nephrectomy 05/14/2016  . Essential hypertension 05/14/2016  . Depression 05/14/2016  . Sleep apnea 05/14/2016  . Former smoker 05/14/2016  . History of ankle fracture 05/14/2016  . Acute medial meniscus tear of right knee 10/08/2011    Class: Acute  . Osteoarthritis of right knee 10/08/2011     Class: Chronic    Past Medical History:  Diagnosis Date  . Depression   . Hyperlipidemia   . Hypertension   . Positive PPD    works Engineer, maintenance (IT)  . Rheumatoid arthritis(714.0)   . Sleep apnea    uses cpap-mod-severe    History reviewed. No pertinent family history. Past Surgical History:  Procedure Laterality Date  . ANKLE ARTHRODESIS  2010   left  . BREAST LUMPECTOMY  1998   rt  . BREAST SURGERY     rt breast bx  . CERVICAL BIOPSY    . KIDNEY SURGERY    . NEPHRECTOMY  2007   right  . TUBAL LIGATION     Social History   Social History Narrative  . No narrative on file     Objective: Vital Signs: Ht 5\' 2"  (1.575 m)   Wt 161 lb (73 kg)   BMI 29.45 kg/m    Physical Exam  Constitutional: She is oriented to person, place, and time. She appears well-developed and well-nourished.  HENT:  Head: Normocephalic and atraumatic.  Eyes: EOM are normal. Pupils are equal, round, and reactive to light.  Cardiovascular: Normal rate, regular rhythm and normal heart sounds.  Exam reveals no gallop and no friction rub.   No murmur heard. Pulmonary/Chest: Effort normal and breath sounds normal. She has no wheezes. She has no rales.  Abdominal: Soft. Bowel sounds are normal. She exhibits no distension. There is no tenderness. There is no guarding. No hernia.  Musculoskeletal: Normal range of motion. She exhibits no edema, tenderness or deformity.  Lymphadenopathy:    She has no cervical adenopathy.  Neurological: She is alert and oriented to person, place, and time. Coordination normal.  Skin: Skin is warm and dry. Capillary refill takes less than 2 seconds. No rash noted.  Psychiatric: She has a normal mood and affect. Her behavior is normal.     Musculoskeletal Exam:  Full range of motion of all joints Grip strength is equal and strong bilaterally Fiber myalgia tender points are all absent  CDAI Exam: No CDAI exam completed.  No synovitis on  examination  Investigation: Findings:  03/01/2016 normal CBC with Diff and CMP with GFR and Negative TB gold     Imaging: Mm Screening Breast Tomo Bilateral  Result Date: 06/05/2016 CLINICAL DATA:  Screening. EXAM: 2D DIGITAL SCREENING BILATERAL MAMMOGRAM WITH CAD AND ADJUNCT TOMO COMPARISON:  Previous exam(s). ACR Breast Density Category b: There are scattered areas of fibroglandular density. FINDINGS: There are no findings suspicious for malignancy. Images were processed with CAD. IMPRESSION: No mammographic evidence of malignancy. A result letter of this screening mammogram will be mailed directly to the patient. RECOMMENDATION: Screening mammogram in one year. (Code:SM-B-01Y) BI-RADS CATEGORY  1: Negative. Electronically Signed   By: 14/11/2015 M.D.   On: 06/05/2016 16:20    Speciality Comments: No specialty comments available.    Procedures:  No procedures performed Allergies: Sulfa antibiotics and Latex   Assessment / Plan:  Visit Diagnoses: Rheumatoid arthritis with positive rheumatoid factor   High risk medication use - Humira q 2 weeks;  Primary osteoarthritis of both hands  Primary osteoarthritis of right knee  Former smoker  G6PD deficiency [facility])  S/p nephrectomy  Primary osteoarthritis of both knees  Essential hypertension   Plan: #1: We just refilled patient's Humira approximately November. (Avoid giving methotrexate and NSAIDs due to nephrectomy and patient only having 1 kidney). #2: We will do CBC with differential CMP with GFR today (TB go was negative as of August 2017). #3: Return to clinic in 5 months #4: Follow up with Dr. polite regarding and NSAID use. Discuss Voltaren gel with him and the current Aleve that she using once every week or once every 2 weeks for right knee joint pain.   Orders: No orders of the defined types were placed in this encounter.  No orders of the defined types were placed in this encounter.   Face-to-face time  spent with patient was 30 minutes. 50% of time was spent in counseling and coordination of care.  Follow-Up Instructions: Return in about 5 months (around 11/16/2016) for RA, Humira ONLY q 2 weeks, Only 1 kidney; .   Tawni Pummel, PA-C   I examined and evaluated the patient with Tawni Pummel PA. The plan of care was discussed as noted above.  Pollyann Savoy, MD

## 2016-06-18 ENCOUNTER — Ambulatory Visit (INDEPENDENT_AMBULATORY_CARE_PROVIDER_SITE_OTHER): Payer: Medicare Other | Admitting: Rheumatology

## 2016-06-18 ENCOUNTER — Other Ambulatory Visit: Payer: Self-pay | Admitting: Rheumatology

## 2016-06-18 ENCOUNTER — Encounter: Payer: Self-pay | Admitting: Rheumatology

## 2016-06-18 VITALS — BP 159/75 | HR 73 | Ht 62.0 in | Wt 161.0 lb

## 2016-06-18 DIAGNOSIS — M1711 Unilateral primary osteoarthritis, right knee: Secondary | ICD-10-CM

## 2016-06-18 DIAGNOSIS — M170 Bilateral primary osteoarthritis of knee: Secondary | ICD-10-CM

## 2016-06-18 DIAGNOSIS — Z905 Acquired absence of kidney: Secondary | ICD-10-CM | POA: Diagnosis not present

## 2016-06-18 DIAGNOSIS — Z79899 Other long term (current) drug therapy: Secondary | ICD-10-CM

## 2016-06-18 DIAGNOSIS — M19041 Primary osteoarthritis, right hand: Secondary | ICD-10-CM

## 2016-06-18 DIAGNOSIS — D550 Anemia due to glucose-6-phosphate dehydrogenase [G6PD] deficiency: Secondary | ICD-10-CM | POA: Diagnosis not present

## 2016-06-18 DIAGNOSIS — Z87891 Personal history of nicotine dependence: Secondary | ICD-10-CM | POA: Diagnosis not present

## 2016-06-18 DIAGNOSIS — I10 Essential (primary) hypertension: Secondary | ICD-10-CM

## 2016-06-18 DIAGNOSIS — M0579 Rheumatoid arthritis with rheumatoid factor of multiple sites without organ or systems involvement: Secondary | ICD-10-CM | POA: Diagnosis not present

## 2016-06-18 DIAGNOSIS — D75A Glucose-6-phosphate dehydrogenase (G6PD) deficiency without anemia: Secondary | ICD-10-CM

## 2016-06-18 DIAGNOSIS — M19042 Primary osteoarthritis, left hand: Secondary | ICD-10-CM | POA: Diagnosis not present

## 2016-06-18 LAB — CBC WITH DIFFERENTIAL/PLATELET
Basophils Absolute: 63 cells/uL (ref 0–200)
Basophils Relative: 1 %
EOS PCT: 9 %
Eosinophils Absolute: 567 cells/uL — ABNORMAL HIGH (ref 15–500)
HEMATOCRIT: 40.5 % (ref 35.0–45.0)
Hemoglobin: 13.2 g/dL (ref 11.7–15.5)
LYMPHS PCT: 36 %
Lymphs Abs: 2268 cells/uL (ref 850–3900)
MCH: 29.3 pg (ref 27.0–33.0)
MCHC: 32.6 g/dL (ref 32.0–36.0)
MCV: 89.8 fL (ref 80.0–100.0)
MONO ABS: 504 {cells}/uL (ref 200–950)
MPV: 9.8 fL (ref 7.5–12.5)
Monocytes Relative: 8 %
NEUTROS PCT: 46 %
Neutro Abs: 2898 cells/uL (ref 1500–7800)
Platelets: 305 10*3/uL (ref 140–400)
RBC: 4.51 MIL/uL (ref 3.80–5.10)
RDW: 12.6 % (ref 11.0–15.0)
WBC: 6.3 10*3/uL (ref 3.8–10.8)

## 2016-06-18 LAB — THYROID PROFILE - CHCC
FREE THYROXINE INDEX: 2.4 (ref 1.4–3.8)
T3 Uptake: 33 % (ref 22–35)
T4 TOTAL: 7.4 ug/dL (ref 4.5–12.0)

## 2016-06-18 NOTE — Progress Notes (Signed)
I co-evaluated  patient with Mr. Leane Call today. She has some synovial thickening but no active synovitis in her joints. We will continue with the current treatment for right now. Association of heart disease with rheumatoid arthritis was discussed. Need to monitor blood pressure, cholesterol, and to exercise 30-60 minutes on daily basis was discussed. Poor dental hygiene can be a predisposing factor for rheumatoid arthritis. Good dental hygiene was discussed. Pollyann Savoy, MD

## 2016-06-19 LAB — COMPLETE METABOLIC PANEL WITH GFR
ALBUMIN: 3.8 g/dL (ref 3.6–5.1)
ALK PHOS: 73 U/L (ref 33–130)
ALT: 14 U/L (ref 6–29)
AST: 19 U/L (ref 10–35)
BUN: 20 mg/dL (ref 7–25)
CALCIUM: 9.1 mg/dL (ref 8.6–10.4)
CO2: 27 mmol/L (ref 20–31)
Chloride: 103 mmol/L (ref 98–110)
Creat: 0.99 mg/dL (ref 0.50–0.99)
GFR, EST AFRICAN AMERICAN: 69 mL/min (ref >=60)
GFR, EST NON AFRICAN AMERICAN: 60 mL/min (ref >=60)
Glucose, Bld: 90 mg/dL (ref 65–99)
POTASSIUM: 3.6 mmol/L (ref 3.5–5.3)
Sodium: 142 mmol/L (ref 135–146)
Total Bilirubin: 0.6 mg/dL (ref 0.2–1.2)
Total Protein: 7.2 g/dL (ref 6.1–8.1)

## 2016-06-19 LAB — LIPID PANEL
CHOL/HDL RATIO: 5.3 ratio — AB (ref <5.0)
CHOLESTEROL: 218 mg/dL — AB (ref <200)
HDL: 41 mg/dL — AB (ref >50)
LDL Cholesterol: 159 mg/dL — ABNORMAL HIGH (ref <100)
Triglycerides: 91 mg/dL (ref <150)
VLDL: 18 mg/dL (ref <30)

## 2016-06-19 NOTE — Progress Notes (Signed)
LDL high, rest is negative. Please fax a copy to the PCP

## 2016-08-05 ENCOUNTER — Telehealth: Payer: Self-pay | Admitting: Radiology

## 2016-08-05 ENCOUNTER — Other Ambulatory Visit: Payer: Self-pay | Admitting: Radiology

## 2016-08-05 DIAGNOSIS — E785 Hyperlipidemia, unspecified: Secondary | ICD-10-CM

## 2016-08-05 DIAGNOSIS — Z79899 Other long term (current) drug therapy: Secondary | ICD-10-CM

## 2016-08-05 LAB — CBC WITH DIFFERENTIAL/PLATELET
BASOS ABS: 0 {cells}/uL (ref 0–200)
Basophils Relative: 0 %
Eosinophils Absolute: 657 cells/uL — ABNORMAL HIGH (ref 15–500)
Eosinophils Relative: 9 %
HEMATOCRIT: 41.2 % (ref 35.0–45.0)
HEMOGLOBIN: 13.5 g/dL (ref 11.7–15.5)
LYMPHS ABS: 2190 {cells}/uL (ref 850–3900)
Lymphocytes Relative: 30 %
MCH: 29.1 pg (ref 27.0–33.0)
MCHC: 32.8 g/dL (ref 32.0–36.0)
MCV: 88.8 fL (ref 80.0–100.0)
MONO ABS: 657 {cells}/uL (ref 200–950)
MPV: 9.3 fL (ref 7.5–12.5)
Monocytes Relative: 9 %
NEUTROS ABS: 3796 {cells}/uL (ref 1500–7800)
NEUTROS PCT: 52 %
Platelets: 316 10*3/uL (ref 140–400)
RBC: 4.64 MIL/uL (ref 3.80–5.10)
RDW: 12.7 % (ref 11.0–15.0)
WBC: 7.3 10*3/uL (ref 3.8–10.8)

## 2016-08-05 LAB — LIPID PANEL
CHOLESTEROL: 212 mg/dL — AB (ref <200)
HDL: 42 mg/dL — AB (ref >50)
LDL CALC: 145 mg/dL — AB (ref <100)
TRIGLYCERIDES: 127 mg/dL (ref <150)
Total CHOL/HDL Ratio: 5 Ratio — ABNORMAL HIGH (ref <5.0)
VLDL: 25 mg/dL (ref <30)

## 2016-08-05 LAB — COMPREHENSIVE METABOLIC PANEL
ALBUMIN: 3.8 g/dL (ref 3.6–5.1)
ALT: 13 U/L (ref 6–29)
AST: 19 U/L (ref 10–35)
Alkaline Phosphatase: 87 U/L (ref 33–130)
BUN: 16 mg/dL (ref 7–25)
CALCIUM: 9.5 mg/dL (ref 8.6–10.4)
CO2: 32 mmol/L — AB (ref 20–31)
CREATININE: 0.9 mg/dL (ref 0.50–0.99)
Chloride: 103 mmol/L (ref 98–110)
Glucose, Bld: 91 mg/dL (ref 65–99)
Potassium: 3.8 mmol/L (ref 3.5–5.3)
Sodium: 141 mmol/L (ref 135–146)
TOTAL PROTEIN: 7.1 g/dL (ref 6.1–8.1)
Total Bilirubin: 0.6 mg/dL (ref 0.2–1.2)

## 2016-08-05 NOTE — Telephone Encounter (Signed)
Thanks/ added fasting lipids

## 2016-08-05 NOTE — Telephone Encounter (Signed)
As verbally approved earlier today, it is ok to add lipids (and pt was fasting for this lab draw)

## 2016-08-05 NOTE — Telephone Encounter (Signed)
Patient had CBC CMP in office today She wants you to add on lipids, is this okay ?

## 2016-08-13 ENCOUNTER — Telehealth: Payer: Self-pay | Admitting: Rheumatology

## 2016-08-13 NOTE — Telephone Encounter (Signed)
Patient receive prescription through [employer] patient assistance program  Last Visit: 06/18/16 Next Visit: 12/06/16 Labs: 08/05/16 CMP with GFR is within normal limits #2: CBC with differential is within normal limits except eosinophils are elevated as they have been in the past (currently 657; it was 567 a month ago.) TB Gold: 03/01/16 Neg  Okay to refill Humira?

## 2016-08-13 NOTE — Telephone Encounter (Signed)
Patient called to get the results of her lab work and also to get a refill on her Humira.  Cb#[phone].  Thank you

## 2016-08-13 NOTE — Telephone Encounter (Signed)
Ok to refill humira

## 2016-08-14 NOTE — Telephone Encounter (Signed)
Prescription faxed

## 2016-08-15 ENCOUNTER — Telehealth: Payer: Self-pay | Admitting: *Deleted

## 2016-08-15 NOTE — Telephone Encounter (Signed)
I called Humira patient assistance program and spoke to Vernona Rieger, pharmacist.  She reports they can fill the prescription early for Mrs. Drohan due to her circumstances.  They have not receive the prescription that was faxed yesterday.  I provided verbal order for Humira 40 mg every other week (quantity 6, zero refills).  She reports they will start processing the prescription.  They do not usually do Saturday delivery of medication, but depending on when patient leaves for [place] on Tuesday, 08/20/16, they may be able to make an exception and deliver on Saturday.  She advised patient to call the program first thing in the morning to schedule delivery of her medication.   I called patient and informed her of this information.  She reports she is leaving early Tuesday morning and will need medication delivered Saturday.  She confirmed she will call the Humira patient assistance program first thing in the morning.  I advised her to call our office if they are unable to deliver the medication on Saturday.  Patient voiced understanding.

## 2016-08-15 NOTE — Telephone Encounter (Signed)
Can you look into this?

## 2016-08-15 NOTE — Telephone Encounter (Signed)
Patient states she is currently on Humira and is due to leave for [place] on 08/20/16 and not return until May 2018. Patient states she receives her medication through the [facility] patient assistance and they will not send her anymore Humira until she uses her last pen. Patient has two pens currently but not enough to travel to [place] with. She is currently using the Humira every 2 weeks but would like to know if she can try using it once per month and if if we could provide her with some samples so she would have what she needs until she returns.

## 2016-08-16 NOTE — Telephone Encounter (Signed)
I'm sorry, she called to talk to you about getting set up with her medication.  Cb#[phone].  Thank you.

## 2016-08-16 NOTE — Telephone Encounter (Signed)
Patient stopped by the office and she called the Humira patient assistance program while here and was able to set up shipment of her Humira.

## 2016-08-16 NOTE — Telephone Encounter (Signed)
Patient called to return your call from yesterday.  Thank you

## 2016-11-21 ENCOUNTER — Telehealth: Payer: Self-pay | Admitting: Rheumatology

## 2016-11-21 NOTE — Telephone Encounter (Signed)
Patient advised her labs are due now. Patient advised we have her lab orders in the computer and will release them when she comes to the office for labs. Patient states she will come to office tomorrow for her labs.

## 2016-11-21 NOTE — Telephone Encounter (Signed)
Patient came into the clinic wanting to know if she has a standing order and when the next she needs to have labs complete.  CB#[phone].  Thank you.

## 2016-11-22 ENCOUNTER — Other Ambulatory Visit: Payer: Self-pay

## 2016-11-22 DIAGNOSIS — Z79899 Other long term (current) drug therapy: Secondary | ICD-10-CM

## 2016-11-22 LAB — CBC WITH DIFFERENTIAL/PLATELET
BASOS PCT: 0 %
Basophils Absolute: 0 cells/uL (ref 0–200)
EOS PCT: 7 %
Eosinophils Absolute: 511 cells/uL — ABNORMAL HIGH (ref 15–500)
HEMATOCRIT: 38.2 % (ref 35.0–45.0)
Hemoglobin: 12.3 g/dL (ref 11.7–15.5)
LYMPHS ABS: 2336 {cells}/uL (ref 850–3900)
LYMPHS PCT: 32 %
MCH: 28.7 pg (ref 27.0–33.0)
MCHC: 32.2 g/dL (ref 32.0–36.0)
MCV: 89.3 fL (ref 80.0–100.0)
MONO ABS: 657 {cells}/uL (ref 200–950)
MPV: 9.8 fL (ref 7.5–12.5)
Monocytes Relative: 9 %
Neutro Abs: 3796 cells/uL (ref 1500–7800)
Neutrophils Relative %: 52 %
PLATELETS: 297 10*3/uL (ref 140–400)
RBC: 4.28 MIL/uL (ref 3.80–5.10)
RDW: 13 % (ref 11.0–15.0)
WBC: 7.3 10*3/uL (ref 3.8–10.8)

## 2016-11-23 LAB — COMPREHENSIVE METABOLIC PANEL
ALT: 13 U/L (ref 6–29)
AST: 21 U/L (ref 10–35)
Albumin: 3.5 g/dL — ABNORMAL LOW (ref 3.6–5.1)
Alkaline Phosphatase: 88 U/L (ref 33–130)
BILIRUBIN TOTAL: 0.5 mg/dL (ref 0.2–1.2)
BUN: 14 mg/dL (ref 7–25)
CO2: 29 mmol/L (ref 20–31)
CREATININE: 0.98 mg/dL (ref 0.50–0.99)
Calcium: 9.5 mg/dL (ref 8.6–10.4)
Chloride: 100 mmol/L (ref 98–110)
GLUCOSE: 84 mg/dL (ref 65–99)
Potassium: 3.8 mmol/L (ref 3.5–5.3)
SODIUM: 137 mmol/L (ref 135–146)
Total Protein: 6.9 g/dL (ref 6.1–8.1)

## 2016-12-02 ENCOUNTER — Telehealth: Payer: Self-pay | Admitting: Rheumatology

## 2016-12-02 NOTE — Telephone Encounter (Signed)
Patient called stating that her Humira was suppose to be called into the assistance program.  She called the assistance program and they have not received the order for them to send her the supply.  CB#[phone].  Thank you.

## 2016-12-04 NOTE — Telephone Encounter (Signed)
Last Visit: 06/18/17 Next Visit: 12/06/16 Labs: 11/22/16 WNL  Prescription given to Mr. Leane Call to sign and will fax.

## 2016-12-04 NOTE — Telephone Encounter (Signed)
Patient advised prescription has been sent to the patient assistance program for refill.

## 2016-12-06 ENCOUNTER — Ambulatory Visit (INDEPENDENT_AMBULATORY_CARE_PROVIDER_SITE_OTHER): Payer: Medicare Other | Admitting: Rheumatology

## 2016-12-06 ENCOUNTER — Encounter: Payer: Self-pay | Admitting: Rheumatology

## 2016-12-06 VITALS — BP 128/78 | HR 78 | Resp 14 | Ht 62.0 in | Wt 152.0 lb

## 2016-12-06 DIAGNOSIS — M0579 Rheumatoid arthritis with rheumatoid factor of multiple sites without organ or systems involvement: Secondary | ICD-10-CM | POA: Diagnosis not present

## 2016-12-06 DIAGNOSIS — Z905 Acquired absence of kidney: Secondary | ICD-10-CM | POA: Diagnosis not present

## 2016-12-06 DIAGNOSIS — Z6830 Body mass index (BMI) 30.0-30.9, adult: Secondary | ICD-10-CM

## 2016-12-06 DIAGNOSIS — E669 Obesity, unspecified: Secondary | ICD-10-CM

## 2016-12-06 DIAGNOSIS — Z79899 Other long term (current) drug therapy: Secondary | ICD-10-CM

## 2016-12-06 MED ORDER — DICLOFENAC SODIUM 1 % TD GEL: TRANSDERMAL | 3 refills | Status: DC

## 2016-12-06 NOTE — Progress Notes (Signed)
Office Visit Note  Patient: Diana [name]             Date of Birth: 1950/09/20           MRN: [id number]             PCP: Seward Carol, MD Referring: No ref. provider found Visit Date: 12/06/2016 Occupation: _0 @    Subjective:  Medication Management   History of Present Illness: Diana [name] is a 66 y.o. female   Who was last seen 06/18/2016. She has rheumatoid arthritis with positive CCP and rheumatoid factor. She has a history of erosive disease. She does not have any evidence of synovitis in the past exams. She is doing well with Humira that she takes every 2 weeks. She also did well when she held off on her medicine for couple weeks when she was trying to manage her bronchitis in April 2017.  No complaints except she is awaiting her Humira shipment and has not yet arrived. If it doesn't come today, she will be missing her Humira injection and is requesting a sample of Humira to go. I'm agreeable and we will provide her with a sample  She has worked hard with proper diet and exercise to lose weight. She has lost about 30 pounds according to her records.  Activities of Daily Living:  Patient reports morning stiffness for 15 minutes.   Patient Denies nocturnal pain.  Difficulty dressing/grooming: Denies Difficulty climbing stairs: Denies Difficulty getting out of chair: Denies Difficulty using hands for taps, buttons, cutlery, and/or writing: Denies   Review of Systems  Constitutional: Negative for fatigue.  HENT: Negative for mouth sores and mouth dryness.   Eyes: Negative for dryness.  Respiratory: Negative for shortness of breath.   Gastrointestinal: Negative for constipation and diarrhea.  Musculoskeletal: Negative for myalgias and myalgias.  Skin: Negative for sensitivity to sunlight.  Psychiatric/Behavioral: Negative for decreased concentration and sleep disturbance.    PMFS History:  Patient Active Problem List   Diagnosis Date Noted  .  Rheumatoid arthritis with positive rheumatoid factor  05/14/2016  . High risk medication use 05/14/2016  . Primary osteoarthritis of both hands 05/14/2016  . Primary osteoarthritis of both knees 05/14/2016  . G6PD deficiency ([place]) 05/14/2016  . S/p nephrectomy 05/14/2016  . Essential hypertension 05/14/2016  . Depression 05/14/2016  . Sleep apnea 05/14/2016  . Former smoker 05/14/2016  . History of ankle fracture 05/14/2016  . Acute medial meniscus tear of right knee 10/08/2011    Class: Acute  . Osteoarthritis of right knee 10/08/2011    Class: Chronic    Past Medical History:  Diagnosis Date  . Depression   . Hyperlipidemia   . Hypertension   . 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 Rheumatoid arthritis(714.0)   . Sleep apnea    uses cpap-mod-severe    No family history on file. Past Surgical History:  Procedure Laterality Date  . ANKLE ARTHRODESIS  2010   left  . BREAST LUMPECTOMY  1998   rt  . BREAST SURGERY     rt breast bx  . CERVICAL BIOPSY    . KIDNEY SURGERY    . NEPHRECTOMY  2007   right  . TUBAL LIGATION     Social History   Social History Narrative  . No narrative on file     Objective: Vital Signs: BP 128/78   Pulse 78   Resp 14   Ht _1  (1.575 m)  Wt 152 lb (68.9 kg)   BMI 27.80 kg/m    Physical Exam  Constitutional: She is oriented to person, place, and time. She appears well-developed and well-nourished.  HENT:  Head: Normocephalic and atraumatic.  Eyes: EOM are normal. Pupils are equal, round, and reactive to light.  Cardiovascular: Normal rate, regular rhythm and normal heart sounds.  Exam reveals no gallop and no friction rub.   No murmur heard. Pulmonary/Chest: Effort normal and breath sounds normal. She has no wheezes. She has no rales.  Abdominal: Soft. Bowel sounds are normal. She exhibits no distension. There is no tenderness. There is no guarding. No hernia.  Musculoskeletal: Normal range of motion. She exhibits no edema,  tenderness or deformity.  Lymphadenopathy:    She has no cervical adenopathy.  Neurological: She is alert and oriented to person, place, and time. Coordination normal.  Skin: Skin is warm and dry. Capillary refill takes less than 2 seconds. No rash noted.  Psychiatric: She has a normal mood and affect. Her behavior is normal.  Nursing note and vitals reviewed.    Musculoskeletal Exam:  Full range of motion of all joints Grip strength is equal and strong bilaterally Fibromyalgia tender points are all absent  CDAI Exam: CDAI Homunculus Exam:   Joint Counts:  CDAI Tender Joint count: 0 CDAI Swollen Joint count: 0  Global Assessments:  Patient Global Assessment: 1 Provider Global Assessment: 1  CDAI Calculated Score: 2  No synovitis on examination  Investigation: No additional findings. Orders Only on 11/22/2016  Component Date Value Ref Range Status  . WBC 11/22/2016 7.3  3.8 - 10.8 K/uL Final  . RBC 11/22/2016 4.28  3.80 - 5.10 MIL/uL Final  . Hemoglobin 11/22/2016 12.3  11.7 - 15.5 g/dL Final  . HCT 11/22/2016 38.2  35.0 - 45.0 % Final  . MCV 11/22/2016 89.3  80.0 - 100.0 fL Final  . MCH 11/22/2016 28.7  27.0 - 33.0 pg Final  . MCHC 11/22/2016 32.2  32.0 - 36.0 g/dL Final  . RDW 11/22/2016 13.0  11.0 - 15.0 % Final  . Platelets 11/22/2016 297  140 - 400 K/uL Final  . MPV 11/22/2016 9.8  7.5 - 12.5 fL Final  . Neutro Abs 11/22/2016 3796  1,500 - 7,800 cells/uL Final  . Lymphs Abs 11/22/2016 2336  850 - 3,900 cells/uL Final  . Monocytes Absolute 11/22/2016 657  200 - 950 cells/uL Final  . Eosinophils Absolute 11/22/2016 511* 15 - 500 cells/uL Final  . Basophils Absolute 11/22/2016 0  0 - 200 cells/uL Final  . Neutrophils Relative % 11/22/2016 52  % Final  . Lymphocytes Relative 11/22/2016 32  % Final  . Monocytes Relative 11/22/2016 9  % Final  . Eosinophils Relative 11/22/2016 7  % Final  . Basophils Relative 11/22/2016 0  % Final  . Smear Review 11/22/2016 Criteria  for review not met   Final  . Sodium 11/22/2016 137  135 - 146 mmol/L Final  . Potassium 11/22/2016 3.8  3.5 - 5.3 mmol/L Final  . Chloride 11/22/2016 100  98 - 110 mmol/L Final  . CO2 11/22/2016 29  20 - 31 mmol/L Final  . Glucose, Bld 11/22/2016 84  65 - 99 mg/dL Final  . BUN 11/22/2016 14  7 - 25 mg/dL Final  . Creat 11/22/2016 0.98  0.50 - 0.99 mg/dL Final   Comment:   For patients > or = 67 years of age: The upper reference limit for Creatinine is approximately 13% higher  for people identified as African-American.     . Total Bilirubin 11/22/2016 0.5  0.2 - 1.2 mg/dL Final  . Alkaline Phosphatase 11/22/2016 88  33 - 130 U/L Final  . AST 11/22/2016 21  10 - 35 U/L Final  . ALT 11/22/2016 13  6 - 29 U/L Final  . Total Protein 11/22/2016 6.9  6.1 - 8.1 g/dL Final  . Albumin 11/22/2016 3.5* 3.6 - 5.1 g/dL Final  . Calcium 11/22/2016 9.5  8.6 - 10.4 mg/dL Final  Telephone on 08/05/2016  Component Date Value Ref Range Status  . Cholesterol 08/05/2016 212* <200 mg/dL Final  . Triglycerides 08/05/2016 127  <150 mg/dL Final  . HDL 08/05/2016 42* >50 mg/dL Final  . Total CHOL/HDL Ratio 08/05/2016 5.0* <5.0 Ratio Final  . VLDL 08/05/2016 25  <30 mg/dL Final  . LDL Cholesterol 08/05/2016 145* <100 mg/dL Final  Orders Only on 08/05/2016  Component Date Value Ref Range Status  . WBC 08/05/2016 7.3  3.8 - 10.8 K/uL Final  . RBC 08/05/2016 4.64  3.80 - 5.10 MIL/uL Final  . Hemoglobin 08/05/2016 13.5  11.7 - 15.5 g/dL Final  . HCT 08/05/2016 41.2  35.0 - 45.0 % Final  . MCV 08/05/2016 88.8  80.0 - 100.0 fL Final  . MCH 08/05/2016 29.1  27.0 - 33.0 pg Final  . MCHC 08/05/2016 32.8  32.0 - 36.0 g/dL Final  . RDW 08/05/2016 12.7  11.0 - 15.0 % Final  . Platelets 08/05/2016 316  140 - 400 K/uL Final  . MPV 08/05/2016 9.3  7.5 - 12.5 fL Final  . Neutro Abs 08/05/2016 3796  1,500 - 7,800 cells/uL Final  . Lymphs Abs 08/05/2016 2190  850 - 3,900 cells/uL Final  . Monocytes Absolute 08/05/2016  657  200 - 950 cells/uL Final  . Eosinophils Absolute 08/05/2016 657* 15 - 500 cells/uL Final  . Basophils Absolute 08/05/2016 0  0 - 200 cells/uL Final  . Neutrophils Relative % 08/05/2016 52  % Final  . Lymphocytes Relative 08/05/2016 30  % Final  . Monocytes Relative 08/05/2016 9  % Final  . Eosinophils Relative 08/05/2016 9  % Final  . Basophils Relative 08/05/2016 0  % Final  . Smear Review 08/05/2016 Criteria for review not met   Final  . Sodium 08/05/2016 141  135 - 146 mmol/L Final  . Potassium 08/05/2016 3.8  3.5 - 5.3 mmol/L Final  . Chloride 08/05/2016 103  98 - 110 mmol/L Final  . CO2 08/05/2016 32* 20 - 31 mmol/L Final  . Glucose, Bld 08/05/2016 91  65 - 99 mg/dL Final  . BUN 08/05/2016 16  7 - 25 mg/dL Final  . Creat 08/05/2016 0.90  0.50 - 0.99 mg/dL Final   Comment:   For patients > or = 67 years of age: The upper reference limit for Creatinine is approximately 13% higher for people identified as African-American.     . Total Bilirubin 08/05/2016 0.6  0.2 - 1.2 mg/dL Final  . Alkaline Phosphatase 08/05/2016 87  33 - 130 U/L Final  . AST 08/05/2016 19  10 - 35 U/L Final  . ALT 08/05/2016 13  6 - 29 U/L Final  . Total Protein 08/05/2016 7.1  6.1 - 8.1 g/dL Final  . Albumin 08/05/2016 3.8  3.6 - 5.1 g/dL Final  . Calcium 08/05/2016 9.5  8.6 - 10.4 mg/dL Final  Orders Only on 06/18/2016  Component Date Value Ref Range Status  . Sodium 06/18/2016 142  135 -  146 mmol/L Final  . Potassium 06/18/2016 3.6  3.5 - 5.3 mmol/L Final  . Chloride 06/18/2016 103  98 - 110 mmol/L Final  . CO2 06/18/2016 27  20 - 31 mmol/L Final  . Glucose, Bld 06/18/2016 90  65 - 99 mg/dL Final  . BUN 06/18/2016 20  7 - 25 mg/dL Final  . Creat 06/18/2016 0.99  0.50 - 0.99 mg/dL Final   Comment:   For patients > or = 67 years of age: The upper reference limit for Creatinine is approximately 13% higher for people identified as African-American.     . Total Bilirubin 06/18/2016 0.6  0.2 - 1.2  mg/dL Final  . Alkaline Phosphatase 06/18/2016 73  33 - 130 U/L Final  . AST 06/18/2016 19  10 - 35 U/L Final  . ALT 06/18/2016 14  6 - 29 U/L Final  . Total Protein 06/18/2016 7.2  6.1 - 8.1 g/dL Final  . Albumin 06/18/2016 3.8  3.6 - 5.1 g/dL Final  . Calcium 06/18/2016 9.1  8.6 - 10.4 mg/dL Final  . GFR, Est African American 06/18/2016 69  >=60 mL/min Final  . GFR, Est Non African American 06/18/2016 60  >=60 mL/min Final  . T4, Total 06/18/2016 7.4  4.5 - 12.0 ug/dL Final  . T3 Uptake 06/18/2016 33  22 - 35 % Final  . Free Thyroxine Index 06/18/2016 2.4  1.4 - 3.8 Final  . WBC 06/18/2016 6.3  3.8 - 10.8 K/uL Final  . RBC 06/18/2016 4.51  3.80 - 5.10 MIL/uL Final  . Hemoglobin 06/18/2016 13.2  11.7 - 15.5 g/dL Final  . HCT 06/18/2016 40.5  35.0 - 45.0 % Final  . MCV 06/18/2016 89.8  80.0 - 100.0 fL Final  . MCH 06/18/2016 29.3  27.0 - 33.0 pg Final  . MCHC 06/18/2016 32.6  32.0 - 36.0 g/dL Final  . RDW 06/18/2016 12.6  11.0 - 15.0 % Final  . Platelets 06/18/2016 305  140 - 400 K/uL Final  . MPV 06/18/2016 9.8  7.5 - 12.5 fL Final  . Neutro Abs 06/18/2016 2898  1,500 - 7,800 cells/uL Final  . Lymphs Abs 06/18/2016 2268  850 - 3,900 cells/uL Final  . Monocytes Absolute 06/18/2016 504  200 - 950 cells/uL Final  . Eosinophils Absolute 06/18/2016 567* 15 - 500 cells/uL Final  . Basophils Absolute 06/18/2016 63  0 - 200 cells/uL Final  . Neutrophils Relative % 06/18/2016 46  % Final  . Lymphocytes Relative 06/18/2016 36  % Final  . Monocytes Relative 06/18/2016 8  % Final  . Eosinophils Relative 06/18/2016 9  % Final  . Basophils Relative 06/18/2016 1  % Final  . Smear Review 06/18/2016 Criteria for review not met   Final  . Cholesterol 06/18/2016 218* <200 mg/dL Final  . Triglycerides 06/18/2016 91  <150 mg/dL Final  . HDL 06/18/2016 41* >50 mg/dL Final  . Total CHOL/HDL Ratio 06/18/2016 5.3* <5.0 Ratio Final  . VLDL 06/18/2016 18  <30 mg/dL Final  . LDL Cholesterol 06/18/2016  159* <100 mg/dL Final     Imaging: No results found.  Speciality Comments: No specialty comments available.    Procedures:  No procedures performed Allergies: Sulfa antibiotics and Latex   Assessment / Plan:     Visit Diagnoses: Rheumatoid arthritis with positive rheumatoid factor   High risk medication use  S/p nephrectomy - 12/06/2016: Pt only has 1kidney & knows to avoid NSAIDs &other items that can affect kidneys.; On occasion she  usse Aleve rarely. Instead, will try voltaren gel   Plan: #1: Rheumatoid arthritis with positive rheumatoid factor and CCP. Doing well. No synovitis on examination at this visit nor on the last visit.  #2: High risk prescription. On Humira. Takes every 2 weeks. No issues except she did not get her current shipment that was due and we will give her sample of Humira to go  #3: History of nephrectomy. She only has one kidney. She is aware to avoid items section affect her kidneys. She does take Aleve but on rare occasions. I asked her to try Voltaren gel and she needs a prescription. She is aware of how to use the medication and she knows not to use Aleve when using Voltaren gel.  #4: Weight loss. Intentional weight loss through proper diet and exercise.  On 06/18/2016 visit, her weight was 161 pounds with a BMI of 29.45 On 12/06/2016 visit, her weight is  Wt 152 lb (68.9 kg)   BMI 27.80 kg/m  Orders: No orders of the defined types were placed in this encounter.  Meds ordered this encounter  Medications  . diclofenac sodium (VOLTAREN) 1 % GEL    Sig: Voltaren Gel 3 grams to 3 large joints upto TID 3 TUBES with 3 refills    Dispense:  1 Tube    Refill:  3    Voltaren Gel 3 grams to 3 large joints upto TID 3 TUBES with 3 refills    Order Specific Question:   Supervising Provider    Answer:   Lyda Perone    Face-to-face time spent with patient was 30 minutes. 50% of time was spent in counseling and coordination of  care.  Follow-Up Instructions: Return in about 5 months (around 05/08/2017) for RA,humira,oa hands,1 kidney, adeq response & labs.   Eliezer Lofts, PA-C  Note - This record has been created using [employer].  Chart creation errors have been sought, but may not always  have been located. Such creation errors do not reflect on  the standard of medical care.

## 2016-12-09 ENCOUNTER — Telehealth: Payer: Self-pay

## 2016-12-09 NOTE — Telephone Encounter (Signed)
Patient came to clinic to sign paperwork and provide financial documents. Once the provider portion is complete and signed off, we will fax the application. Can you complete the provider portion? Thanks  Azayla Polo, [place], CPhT 3:52 PM

## 2016-12-09 NOTE — Telephone Encounter (Signed)
Received a fax from [facility] regarding a renewal application for patient. She is currently approved for medication assistance through 05/3117. They are requesting a renewal application to review eligibility and confirm enrollment.   Spoke to patient who states that she can come in later today to fill out and sign document. She will bring financial documents with her. After information has been collected and completed with the provider portion, we will fax the application.   Kechia Yahnke, [place], CPhT 12:55 PM

## 2016-12-09 NOTE — Telephone Encounter (Signed)
Denyse Amass is requesting renewal of patient's Humira patient assistance application.    Last visit: 12/06/16 Next visit: 05/07/17 Labs: 11/22/16 CMP normal, CBC normal; 02/27/16 TB negative  Okay to renew patient assistance application?  Application was placed on your desk.    Lilla Shook, Pharm.D., BCPS, CPP Clinical Pharmacist Pager: [phone] Phone: [phone] 12/09/2016 4:58 PM

## 2016-12-09 NOTE — Telephone Encounter (Signed)
Form signed.

## 2016-12-09 NOTE — Telephone Encounter (Signed)
Received a prior authorization request for Diclofenac Gel from [facility]. A request was submitted through cover my meds.   We received a confirmation of approval from cover my meds. Medication has been approved through 06/30/17.   Reference number: [id number] Phone number: [phone]  Spoke to patient to update her. She voices understanding and denies any questions at this time.  Sheryle Vice, [place], CPhT 12:42 PM

## 2016-12-10 NOTE — Telephone Encounter (Signed)
Application was faxed to [facility] patient assistance foundation.    Lilla Shook, Pharm.D., BCPS, CPP Clinical Pharmacist Pager: [phone] Phone: [phone] 12/10/2016 7:38 AM

## 2016-12-18 ENCOUNTER — Telehealth: Payer: Self-pay

## 2016-12-18 NOTE — Telephone Encounter (Signed)
Patient called and states that she received a call from [facility] asking her to re-submit application with her signature that is missing from the current one. She used her last pen this week and is worried that she won't have another dose when she is due for her next injections.   I called [employer] and spoke with Adrianna to get clarification. She states that the patient's signature on the application was missing. After reviewing the application with her, it shows that the patient has marked out the date year beside her name. (It was her birth year). The patient re-wrote the current year. After verification, Karna Christmas states that the application will continue to be processed. The patient will continue to have her medication supplied through 06/30/17. The renewal application that was filled out will confirm her eligibility for 2019. She will receive a three question questionnaire near the end of the year to fill out and mail back in. Patient should call to set up shipment for her next dose.   Left message for patient to call back.  Haedyn Breau, [place], CPhT 3:25 PM

## 2016-12-19 NOTE — Telephone Encounter (Signed)
Spoke to patient to give her the update. She plans to contact the foundation later today to set up her next shipment. Patient voiced understanding and denied any questions at this time.   Diana [name], [place], CPhT 10:45 AM

## 2017-03-20 ENCOUNTER — Telehealth: Payer: Self-pay | Admitting: *Deleted

## 2017-03-20 MED ORDER — ADALIMUMAB 40 MG/0.8ML ~~LOC~~ AJKT: 40.0000 mg | AUTO-INJECTOR | SUBCUTANEOUS | 0 refills | Status: DC

## 2017-03-20 NOTE — Telephone Encounter (Addendum)
Received a fax from the [facility] requesting refill on Humira.  Last Visit: 12/06/16 Next Visit: 05/07/17 Labs: 11/22/16 WNL TB Gold: 03/01/16 Neg  Attempted to contact the patient and unable to leave a message for patient, mailbox not set up.  Patient returned call to the office and states she is [place] until 04[place]15[place]17. Will update labs once she returns. Okay to refill 30 day supply Humira?

## 2017-03-20 NOTE — Telephone Encounter (Signed)
ok 

## 2017-03-27 ENCOUNTER — Telehealth: Payer: Self-pay

## 2017-03-27 NOTE — Telephone Encounter (Signed)
Called [employer] to check the dates for patient renewal applications for 2019. Spoke to Hunter who states that patients who submitted a renewal application from January 2018 to October 1st 2018 will receive a year end form by mail to complete and fax or mail in.(Provider portion not required). If a renewal application has not been submitted, the patient will be required to submit a full application. Patient should receive the document around the first week of November and should return it as soon as possible. If they have not received the form by Thanksgiving, they need to call the foundation. [phone])  Called patient to inform. A renewal application has been submitted so a year end form will be sent for the pt to fill out and send back. No answer. Could not leave a message, no voicemail set up yet.  Diana [name], [place], CPhT 11:25 AM

## 2017-04-16 ENCOUNTER — Other Ambulatory Visit: Payer: Self-pay

## 2017-04-16 DIAGNOSIS — Z79899 Other long term (current) drug therapy: Secondary | ICD-10-CM

## 2017-04-16 LAB — COMPREHENSIVE METABOLIC PANEL
AG RATIO: 1.1 (calc) (ref 1.0–2.5)
ALKALINE PHOSPHATASE (APISO): 94 U/L (ref 33–130)
ALT: 11 U/L (ref 6–29)
AST: 19 U/L (ref 10–35)
Albumin: 3.8 g/dL (ref 3.6–5.1)
BILIRUBIN TOTAL: 0.6 mg/dL (ref 0.2–1.2)
BUN: 17 mg/dL (ref 7–25)
CALCIUM: 9.5 mg/dL (ref 8.6–10.4)
CHLORIDE: 105 mmol/L (ref 98–110)
CO2: 27 mmol/L (ref 20–32)
Creat: 0.91 mg/dL (ref 0.50–0.99)
GLUCOSE: 86 mg/dL (ref 65–99)
Globulin: 3.4 g/dL (calc) (ref 1.9–3.7)
Potassium: 3.8 mmol/L (ref 3.5–5.3)
Sodium: 140 mmol/L (ref 135–146)
Total Protein: 7.2 g/dL (ref 6.1–8.1)

## 2017-04-16 LAB — CBC WITH DIFFERENTIAL/PLATELET
BASOS PCT: 0.5 %
Basophils Absolute: 38 cells/uL (ref 0–200)
EOS ABS: 585 {cells}/uL — AB (ref 15–500)
Eosinophils Relative: 7.8 %
HCT: 41.5 % (ref 35.0–45.0)
HEMOGLOBIN: 13.5 g/dL (ref 11.7–15.5)
LYMPHS ABS: 1800 {cells}/uL (ref 850–3900)
MCH: 28.7 pg (ref 27.0–33.0)
MCHC: 32.5 g/dL (ref 32.0–36.0)
MCV: 88.1 fL (ref 80.0–100.0)
MPV: 10.4 fL (ref 7.5–12.5)
Monocytes Relative: 8 %
NEUTROS ABS: 4478 {cells}/uL (ref 1500–7800)
Neutrophils Relative %: 59.7 %
Platelets: 316 10*3/uL (ref 140–400)
RBC: 4.71 10*6/uL (ref 3.80–5.10)
RDW: 12 % (ref 11.0–15.0)
Total Lymphocyte: 24 %
WBC mixed population: 600 cells/uL (ref 200–950)
WBC: 7.5 10*3/uL (ref 3.8–10.8)

## 2017-04-26 NOTE — Progress Notes (Signed)
Office Visit Note  Patient: Diana [name]             Date of Birth: Feb 05, 1951           MRN: [id number]             PCP: Seward Carol, MD Referring: Seward Carol, MD Visit Date: 05/07/2017 Occupation: @GUAROCC @    Subjective:  Right knee pain   History of Present Illness: Diana [name] is a 66 y.o. female with history of sero positive rheumatoid arthritis. She states she's been having some discomfort in her right knee joint. She states she's been started walking recently.She denies missing any doses of Humira. None of the other joints are painful. She's having some discomfort in the left arm.   Activities of Daily Living:  Patient reports morning stiffness for 2 minutes.   Patient Denies nocturnal pain.  Difficulty dressing/grooming: Denies Difficulty climbing stairs: Denies Difficulty getting out of chair: Denies Difficulty using hands for taps, buttons, cutlery, and/or writing: Denies   Review of Systems  Constitutional: Positive for fatigue. Negative for night sweats, weight gain, weight loss and weakness.  HENT: Negative for mouth sores, trouble swallowing, trouble swallowing, mouth dryness and nose dryness.   Eyes: Negative for pain, redness, visual disturbance and dryness.  Respiratory: Negative for cough, shortness of breath and difficulty breathing.   Cardiovascular: Negative for chest pain, palpitations, hypertension, irregular heartbeat and swelling in legs/feet.  Gastrointestinal: Negative for blood in stool, constipation and diarrhea.  Endocrine: Negative for increased urination.  Genitourinary: Negative for vaginal dryness.  Musculoskeletal: Positive for arthralgias, joint pain and morning stiffness. Negative for joint swelling, myalgias, muscle weakness, muscle tenderness and myalgias.  Skin: Negative for color change, rash, hair loss, skin tightness, ulcers and sensitivity to sunlight.  Allergic/Immunologic: Negative for susceptible to infections.    Neurological: Negative for dizziness, memory loss and night sweats.  Hematological: Negative for swollen glands.  Psychiatric/Behavioral: Negative for depressed mood and sleep disturbance. The patient is not nervous/anxious.     PMFS History:  Patient Active Problem List   Diagnosis Date Noted  . Rheumatoid arthritis with positive rheumatoid factor  05/14/2016  . High risk medication use 05/14/2016  . Primary osteoarthritis of both hands 05/14/2016  . Primary osteoarthritis of both knees 05/14/2016  . G6PD deficiency ([place]) 05/14/2016  . S/p nephrectomy 05/14/2016  . Essential hypertension 05/14/2016  . Depression 05/14/2016  . Sleep apnea 05/14/2016  . Former smoker 05/14/2016  . History of ankle fracture 05/14/2016  . Acute medial meniscus tear of right knee 10/08/2011    Class: Acute  . Osteoarthritis of right knee 10/08/2011    Class: Chronic    Past Medical History:  Diagnosis Date  . Depression   . Hyperlipidemia   . Hypertension   . 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 Rheumatoid arthritis(714.0)   . Sleep apnea    uses cpap-mod-severe    History reviewed. No pertinent family history. Past Surgical History:  Procedure Laterality Date  . ANKLE ARTHRODESIS  2010   left  . BREAST LUMPECTOMY  1998   rt  . BREAST SURGERY     rt breast bx  . CERVICAL BIOPSY    . KIDNEY SURGERY    . NEPHRECTOMY  2007   right  . TUBAL LIGATION     Social History   Social History Narrative  . Not on file     Objective: Vital Signs: BP 134/63 (BP Location: Left  Arm, Patient Position: Sitting, Cuff Size: Normal)   Pulse 66   Ht 5' 1.5" (1.562 m)   Wt 149 lb (67.6 kg)   BMI 27.70 kg/m    Physical Exam  Constitutional: She is oriented to person, place, and time. She appears well-developed and well-nourished.  HENT:  Head: Normocephalic and atraumatic.  Eyes: Conjunctivae and EOM are normal.  Neck: Normal range of motion.  Cardiovascular: Normal rate, regular rhythm,  normal heart sounds and intact distal pulses.  Pulmonary/Chest: Effort normal and breath sounds normal.  Abdominal: Soft. Bowel sounds are normal.  Lymphadenopathy:    She has no cervical adenopathy.  Neurological: She is alert and oriented to person, place, and time.  Skin: Skin is warm and dry. Capillary refill takes less than 2 seconds.  Psychiatric: She has a normal mood and affect. Her behavior is normal.  Nursing note and vitals reviewed.    Musculoskeletal Exam: C-spine and thoracic lumbar spine good range of motion. Shoulder joints elbow joints wrist joints are good range of motion. She had no synovitis over her MCP joints. She is mild DIP PIP thickening. She had warmth and swelling in her right knee joint. Although joints afford range of motion with no synovitis.  CDAI Exam: CDAI Homunculus Exam:   Tenderness:  RLE: tibiofemoral  Swelling:  RLE: tibiofemoral  Joint Counts:  CDAI Tender Joint count: 1 CDAI Swollen Joint count: 1  Global Assessments:  Patient Global Assessment: 5 Provider Global Assessment: 5  CDAI Calculated Score: 12    Investigation: No additional findings.TB Gold: 01/2016 Negative  CBC Latest Ref Rng & Units 04/16/2017 11/22/2016 08/05/2016  WBC 3.8 - 10.8 Thousand/uL 7.5 7.3 7.3  Hemoglobin 11.7 - 15.5 g/dL 13.5 12.3 13.5  Hematocrit 35.0 - 45.0 % 41.5 38.2 41.2  Platelets 140 - 400 Thousand/uL 316 297 316   CMP Latest Ref Rng & Units 04/16/2017 11/22/2016 08/05/2016  Glucose 65 - 99 mg/dL 86 84 91  BUN 7 - 25 mg/dL 17 14 16   Creatinine 0.50 - 0.99 mg/dL 0.91 0.98 0.90  Sodium 135 - 146 mmol/L 140 137 141  Potassium 3.5 - 5.3 mmol/L 3.8 3.8 3.8  Chloride 98 - 110 mmol/L 105 100 103  CO2 20 - 32 mmol/L 27 29 32(H)  Calcium 8.6 - 10.4 mg/dL 9.5 9.5 9.5  Total Protein 6.1 - 8.1 g/dL 7.2 6.9 7.1  Total Bilirubin 0.2 - 1.2 mg/dL 0.6 0.5 0.6  Alkaline Phos 33 - 130 U/L - 88 87  AST 10 - 35 U/L 19 21 19   ALT 6 - 29 U/L 11 13 13     Imaging: No  results found.  Speciality Comments: No specialty comments available.    Procedures:  Large Joint Inj on 05/07/2017 12:06 PM Indications: pain and joint swelling Details: 27 G 1.5 in needle, medial approach  Arthrogram: No  Medications: 1.5 mL lidocaine 1 %; 40 mg triamcinolone acetonide 40 MG/ML Aspirate: 0 mL Procedure, treatment alternatives, risks and benefits explained, specific risks discussed. Consent was given by the patient. Immediately prior to procedure a time out was called to verify the correct patient, procedure, equipment, support staff and site/side marked as required. Patient was prepped and draped in the usual sterile fashion.     Allergies: Sulfa antibiotics and Latex   Assessment / Plan:     Visit Diagnoses: Rheumatoid arthritis with positive rheumatoid factor  - +RF, -ANA, +CCP, elevated ESR, erosive . She's been doing well on Humira every other week. She  has no synovitis on examination except for right knee joint swelling. She believes the right knee joint swelling is started after prolonged walking.  High risk medication use - Humira 40 mg sq q owk. Her labs have been stable we will continue to monitor her labs every 3 months. It TB gold be done on yearly basis. I will give her TB gold today.  Pain in right knee joint: She had warmth and swelling in her right knee joint with a small effusion. After different treatment options and their side effects were discussed right knee joint was injected with cortisone as described above.She tolerated the procedure well.  Primary osteoarthritis of both knees - chondromalacia patella  Primary osteoarthritis of both hands: She does have some underlying osteoarthritis. Hand muscle strengthening exercises and joint protection was discussed.  History of hypertension: Her blood pressure is well controlled.  Other medical problems are listed as follows:  History of depression  Ex-smoker  History of sleep apnea  History of  nephrectomy - right   G6PD deficiency ([place])  History of ankle fracture    Orders: Orders Placed This Encounter  Procedures  . Large Joint Inj  . Quantiferon tb gold assay (blood)  . CBC with Differential/Platelet  . COMPLETE METABOLIC PANEL WITH GFR   Meds ordered this encounter  Medications  . Adalimumab (HUMIRA PEN) 40 MG/0.8ML PNKT    Sig: Inject 40 mg every 14 (fourteen) days into the skin.    Dispense:  3 each    Refill:  0    Face-to-face time spent with patient was 30 minutes. Greater than50% of time was spent in counseling and coordination of care.  Follow-Up Instructions: Return in about 5 months (around 10/05/2017) for Rheumatoid arthritis, Osteoarthritis.   Bo Merino, MD  Note - This record has been created using Editor, commissioning.  Chart creation errors have been sought, but may not always  have been located. Such creation errors do not reflect on  the standard of medical care.

## 2017-05-07 ENCOUNTER — Ambulatory Visit (INDEPENDENT_AMBULATORY_CARE_PROVIDER_SITE_OTHER): Payer: Medicare Other | Admitting: Rheumatology

## 2017-05-07 ENCOUNTER — Encounter: Payer: Self-pay | Admitting: Rheumatology

## 2017-05-07 VITALS — BP 134/63 | HR 66 | Ht 61.5 in | Wt 149.0 lb

## 2017-05-07 DIAGNOSIS — Z8679 Personal history of other diseases of the circulatory system: Secondary | ICD-10-CM

## 2017-05-07 DIAGNOSIS — Z905 Acquired absence of kidney: Secondary | ICD-10-CM

## 2017-05-07 DIAGNOSIS — Z79899 Other long term (current) drug therapy: Secondary | ICD-10-CM

## 2017-05-07 DIAGNOSIS — D75A Glucose-6-phosphate dehydrogenase (G6PD) deficiency without anemia: Secondary | ICD-10-CM

## 2017-05-07 DIAGNOSIS — G8929 Other chronic pain: Secondary | ICD-10-CM | POA: Diagnosis not present

## 2017-05-07 DIAGNOSIS — M19042 Primary osteoarthritis, left hand: Secondary | ICD-10-CM

## 2017-05-07 DIAGNOSIS — Z8669 Personal history of other diseases of the nervous system and sense organs: Secondary | ICD-10-CM

## 2017-05-07 DIAGNOSIS — Z87891 Personal history of nicotine dependence: Secondary | ICD-10-CM

## 2017-05-07 DIAGNOSIS — M0579 Rheumatoid arthritis with rheumatoid factor of multiple sites without organ or systems involvement: Secondary | ICD-10-CM

## 2017-05-07 DIAGNOSIS — M170 Bilateral primary osteoarthritis of knee: Secondary | ICD-10-CM

## 2017-05-07 DIAGNOSIS — M25561 Pain in right knee: Secondary | ICD-10-CM

## 2017-05-07 DIAGNOSIS — Z8659 Personal history of other mental and behavioral disorders: Secondary | ICD-10-CM

## 2017-05-07 DIAGNOSIS — M19041 Primary osteoarthritis, right hand: Secondary | ICD-10-CM

## 2017-05-07 DIAGNOSIS — Z8781 Personal history of (healed) traumatic fracture: Secondary | ICD-10-CM

## 2017-05-07 MED ORDER — TRIAMCINOLONE ACETONIDE 40 MG/ML IJ SUSP
40.0000 mg | INTRAMUSCULAR | Status: AC | PRN
  Administered 2017-05-07: 40 mg via INTRA_ARTICULAR

## 2017-05-07 MED ORDER — ADALIMUMAB 40 MG/0.8ML ~~LOC~~ AJKT: 40.0000 mg | AUTO-INJECTOR | SUBCUTANEOUS | 0 refills | Status: DC

## 2017-05-07 MED ORDER — LIDOCAINE HCL 1 % IJ SOLN
1.5000 mL | INTRAMUSCULAR | Status: AC | PRN
  Administered 2017-05-07: 1.5 mL

## 2017-05-07 NOTE — Patient Instructions (Signed)
Standing Labs We placed an order today for your standing lab work.    Please come back and get your standing labs in January and every 3 months  We have open lab Monday through Friday from 8:30-11:30 AM and 1:30-4 PM at the office of Dr. Jaelen Gellerman.   The office is located at [address], [address], [place], [place] [address] No appointment is necessary.   Labs are drawn by [facility].  You may receive a bill from [facility] for your lab work. If you have any questions regarding directions or hours of operation,  please call [phone].    

## 2017-05-09 LAB — QUANTIFERON TB GOLD ASSAY (BLOOD)
Mitogen-Nil: 1.95 IU/mL
QUANTIFERON TB AG MINUS NIL: 0.02 [IU]/mL
QUANTIFERON(R)-TB GOLD: NEGATIVE
Quantiferon Nil Value: 0.03 IU/mL

## 2017-05-09 NOTE — Progress Notes (Signed)
TB

## 2017-06-30 ENCOUNTER — Telehealth: Payer: Self-pay

## 2017-06-30 NOTE — Telephone Encounter (Signed)
Patient came by clinic to fill out and sign application. Application faxed to foundation. Will update once we receive a response.   Will send documents to scan center.   Doreena Maulden, [place], CPhT 10:12 AM

## 2017-06-30 NOTE — Telephone Encounter (Signed)
Received a fax from [place] Patient Assistance. Patient is required to complete the short 3 question application to extend her enrollment through 06/30/2018.   Called patient to update. She states that she received a call as well from an [place] representative who was going to mail her a copy of the application. She will come by the clinic to fill out the application and provide updated income information to be faxed. Will fax application for patient once complete.   Anna-Marie Coller, [place], CPhT 9:32 AM

## 2017-07-03 NOTE — Telephone Encounter (Signed)
Received a fax from [employer] patient assistance foundation stating that pts has been approved to receive assistance for HUMIRA through 06/30/2018.   Will send document to scan center.  Called patient to update. Pts voices understanding and denies any questions at this time.   Diana [name], [place], CPhT 11:34 AM

## 2017-07-16 ENCOUNTER — Telehealth: Payer: Self-pay | Admitting: Rheumatology

## 2017-07-16 NOTE — Telephone Encounter (Signed)
Patient left a voicemail requesting a call regarding questions about her lab work that needs to be done.

## 2017-07-16 NOTE — Telephone Encounter (Signed)
Patient advised she is due for labs now. Patient states she recently had her bone density scan done and it was recommended she have her Vitamin D level checked. Patient wanted to know if we can check that with her normal standing labs. Patient advised to have then recommending physician to write the order and to send it and we will be able to added to minimize the amount of time she will have to have blood drawn.

## 2017-08-04 ENCOUNTER — Other Ambulatory Visit: Payer: Self-pay

## 2017-08-04 DIAGNOSIS — Z79899 Other long term (current) drug therapy: Secondary | ICD-10-CM

## 2017-08-04 DIAGNOSIS — M8589 Other specified disorders of bone density and structure, multiple sites: Secondary | ICD-10-CM

## 2017-08-05 LAB — CBC WITH DIFFERENTIAL/PLATELET
BASOS ABS: 48 {cells}/uL (ref 0–200)
Basophils Relative: 0.8 %
EOS PCT: 8.8 %
Eosinophils Absolute: 528 cells/uL — ABNORMAL HIGH (ref 15–500)
HEMATOCRIT: 41.2 % (ref 35.0–45.0)
Hemoglobin: 13.6 g/dL (ref 11.7–15.5)
LYMPHS ABS: 1704 {cells}/uL (ref 850–3900)
MCH: 29.3 pg (ref 27.0–33.0)
MCHC: 33 g/dL (ref 32.0–36.0)
MCV: 88.8 fL (ref 80.0–100.0)
MPV: 9.8 fL (ref 7.5–12.5)
Monocytes Relative: 8.5 %
NEUTROS PCT: 53.5 %
Neutro Abs: 3210 cells/uL (ref 1500–7800)
Platelets: 334 10*3/uL (ref 140–400)
RBC: 4.64 10*6/uL (ref 3.80–5.10)
RDW: 11.7 % (ref 11.0–15.0)
Total Lymphocyte: 28.4 %
WBC: 6 10*3/uL (ref 3.8–10.8)
WBCMIX: 510 {cells}/uL (ref 200–950)

## 2017-08-05 LAB — COMPLETE METABOLIC PANEL WITH GFR
AG Ratio: 1.1 (calc) (ref 1.0–2.5)
ALBUMIN MSPROF: 3.7 g/dL (ref 3.6–5.1)
ALT: 16 U/L (ref 6–29)
AST: 22 U/L (ref 10–35)
Alkaline phosphatase (APISO): 92 U/L (ref 33–130)
BUN: 16 mg/dL (ref 7–25)
CO2: 32 mmol/L (ref 20–32)
Calcium: 9.5 mg/dL (ref 8.6–10.4)
Chloride: 100 mmol/L (ref 98–110)
Creat: 0.98 mg/dL (ref 0.50–0.99)
GFR, EST AFRICAN AMERICAN: 69 mL/min/{1.73_m2} (ref >=60)
GFR, Est Non African American: 60 mL/min/{1.73_m2} (ref >=60)
GLUCOSE: 83 mg/dL (ref 65–99)
Globulin: 3.4 g/dL (calc) (ref 1.9–3.7)
Potassium: 3.7 mmol/L (ref 3.5–5.3)
Sodium: 139 mmol/L (ref 135–146)
TOTAL PROTEIN: 7.1 g/dL (ref 6.1–8.1)
Total Bilirubin: 0.5 mg/dL (ref 0.2–1.2)

## 2017-08-05 LAB — VITAMIN D 25 HYDROXY (VIT D DEFICIENCY, FRACTURES): Vit D, 25-Hydroxy: 41 ng/mL (ref 30–100)

## 2017-08-11 ENCOUNTER — Telehealth: Payer: Self-pay | Admitting: Rheumatology

## 2017-08-11 NOTE — Telephone Encounter (Signed)
Last visit: 05/07/2017 Next visit: 10/08/2017 Labs: 08/04/2017 stable  TB Gold: 05/07/2017 Negative   Okay to refill, per Ladona Ridgel.  Refill request has been faxed to [facility].

## 2017-08-11 NOTE — Telephone Encounter (Signed)
Patient needs a refill on Humira sent into [facility].

## 2017-08-22 ENCOUNTER — Telehealth: Payer: Self-pay | Admitting: Rheumatology

## 2017-08-22 NOTE — Telephone Encounter (Signed)
Patient advised I spoke with a pharmacist at [facility] and they are preparing prescription and will send it out to her.

## 2017-08-22 NOTE — Telephone Encounter (Signed)
Patient left a voicemail stating she was having difficulty with her Humira shipment.  She was told that they needed clarification from the doctor before the shipment would be sent.

## 2017-08-27 ENCOUNTER — Telehealth: Payer: Self-pay | Admitting: Rheumatology

## 2017-08-27 NOTE — Telephone Encounter (Signed)
Misty Stanley from [facility] called regarding prescription of Humira for patient.  Please call back at # [phone] Case # [id number]

## 2017-08-29 NOTE — Telephone Encounter (Signed)
Spoke with [facility] and verified prescription

## 2017-09-04 ENCOUNTER — Telehealth: Payer: Self-pay | Admitting: Rheumatology

## 2017-09-04 NOTE — Telephone Encounter (Signed)
Patient asking for Vit D results to be sent to Dr. Myna Bright office. He was ordering provider. Please call patient with any questions.

## 2017-09-04 NOTE — Telephone Encounter (Signed)
Lab results faxed to Dr. Gaye Alken

## 2017-09-26 NOTE — Progress Notes (Signed)
Office Visit Note  Patient: Diana [name]             Date of Birth: 07-Mar-1951           MRN: [id number]             PCP: Seward Carol, MD Referring: Seward Carol, MD Visit Date: 10/08/2017 Occupation: _0 @    Subjective:  Right knee pain    History of Present Illness: SHARONICA KRASZEWSKI is a 67 y.o. female with history of seropositive rheumatoid arthritis and osteoarthritis.  Patient states that she continues to inject Humira every other week.  She denies missing any doses.  She states her last injection was last Monday.  She states that her rheumatoid arthritis has been very well controlled on Humira.  She denies any recent flares.  She denies any swelling in her hands or feet.  She states that she has some joint stiffness later in the evening.  She states that she continues to have bilateral knee pain worse in her right knee.  She states that her right knee swells and is warm on occasion.  She states that she uses Voltaren gel on a daily basis which helps resolve her symptoms.  Activities of Daily Living:  Patient reports morning stiffness for 0 minutes.   Patient Denies nocturnal pain.  Difficulty dressing/grooming: Denies Difficulty climbing stairs: Reports Difficulty getting out of chair: Denies Difficulty using hands for taps, buttons, cutlery, and/or writing: Denies   Review of Systems  Constitutional: Negative for fatigue.  HENT: Negative for mouth sores, mouth dryness and nose dryness.   Eyes: Positive for itching (Using Vision Clear antihistamine eye drops) and dryness. Negative for pain and visual disturbance.  Respiratory: Negative for cough, hemoptysis, shortness of breath and difficulty breathing.   Cardiovascular: Negative for chest pain, palpitations, hypertension and swelling in legs/feet.  Gastrointestinal: Negative for blood in stool, constipation and diarrhea.  Endocrine: Negative for increased urination.  Genitourinary: Negative for painful urination.    Musculoskeletal: Positive for arthralgias, joint pain and joint swelling. Negative for myalgias, muscle weakness, morning stiffness, muscle tenderness and myalgias.  Skin: Positive for color change. Negative for pallor, rash, hair loss, nodules/bumps, skin tightness, ulcers and sensitivity to sunlight.  Allergic/Immunologic: Negative for susceptible to infections.  Neurological: Negative for dizziness, numbness, headaches and weakness.  Hematological: Negative for swollen glands.  Psychiatric/Behavioral: Negative for depressed mood and sleep disturbance. The patient is not nervous/anxious.     PMFS History:  Patient Active Problem List   Diagnosis Date Noted  . Rheumatoid arthritis with positive rheumatoid factor  05/14/2016  . High risk medication use 05/14/2016  . Primary osteoarthritis of both hands 05/14/2016  . Primary osteoarthritis of both knees 05/14/2016  . G6PD deficiency ([place]) 05/14/2016  . S/p nephrectomy 05/14/2016  . Essential hypertension 05/14/2016  . Depression 05/14/2016  . Sleep apnea 05/14/2016  . Former smoker 05/14/2016  . History of ankle fracture 05/14/2016  . Acute medial meniscus tear of right knee 10/08/2011    Class: Acute  . Osteoarthritis of right knee 10/08/2011    Class: Chronic    Past Medical History:  Diagnosis Date  . Depression   . Hyperlipidemia   . Hypertension   . 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 Rheumatoid arthritis(714.0)   . Sleep apnea    uses cpap-mod-severe    History reviewed. No pertinent family history. Past Surgical History:  Procedure Laterality Date  . ANKLE ARTHRODESIS  2010  left  . BREAST LUMPECTOMY  1998   rt  . BREAST SURGERY     rt breast bx  . CERVICAL BIOPSY    . KIDNEY SURGERY    . NEPHRECTOMY  2007   right  . TUBAL LIGATION     Social History   Social History Narrative  . Not on file     Objective: Vital Signs: BP (!) 145/76 (BP Location: Left Arm, Patient Position: Sitting, Cuff Size:  Normal)   Pulse 68   Resp 14   Ht _0  (1.575 m)   Wt 160 lb 8 oz (72.8 kg)   BMI 29.36 kg/m    Physical Exam  Constitutional: She is oriented to person, place, and time. She appears well-developed and well-nourished.  HENT:  Head: Normocephalic and atraumatic.  Eyes: Conjunctivae and EOM are normal.  Neck: Normal range of motion.  Cardiovascular: Normal rate, regular rhythm, normal heart sounds and intact distal pulses.  Pulmonary/Chest: Effort normal and breath sounds normal.  Abdominal: Soft. Bowel sounds are normal.  Lymphadenopathy:    She has no cervical adenopathy.  Neurological: She is alert and oriented to person, place, and time.  Skin: Skin is warm and dry. Capillary refill takes less than 2 seconds.  Psychiatric: She has a normal mood and affect. Her behavior is normal.  Nursing note and vitals reviewed.    Musculoskeletal Exam: C-spine, thoracic spine, lumbar spine good range of motion.  No midline spinal tenderness.  No SI joint tenderness.  Shoulder joints, elbow joints, wrist joints, MCPs, PIPs, DIPs good range of motion with no synovitis.  She has mild DIP synovial thickening consistent with osteoarthritis.  Hip joints, knee joints, ankle joints, MTPs, PIPs, DIPs good range of motion with no synovitis.  Right knee has mild warmth with but no effusion.  Left knee no warmth or effusion.  She has bilateral knee crepitus. .  No tenderness of trochanteric bursa.  CDAI Exam: CDAI Homunculus Exam:   Joint Counts:  CDAI Tender Joint count: 0 CDAI Swollen Joint count: 0  Global Assessments:  Patient Global Assessment: 4 Provider Global Assessment: 4  CDAI Calculated Score: 8    Investigation: No additional findings.TB Gold: 05/07/2017 Negative  CBC Latest Ref Rng & Units 08/04/2017 04/16/2017 11/22/2016  WBC 3.8 - 10.8 Thousand/uL 6.0 7.5 7.3  Hemoglobin 11.7 - 15.5 g/dL 13.6 13.5 12.3  Hematocrit 35.0 - 45.0 % 41.2 41.5 38.2  Platelets 140 - 400 Thousand/uL 334  316 297   CMP Latest Ref Rng & Units 08/04/2017 04/16/2017 11/22/2016  Glucose 65 - 99 mg/dL 83 86 84  BUN 7 - 25 mg/dL _1 Creatinine 0.50 - 0.99 mg/dL 0.98 0.91 0.98  Sodium 135 - 146 mmol/L 139 140 137  Potassium 3.5 - 5.3 mmol/L 3.7 3.8 3.8  Chloride 98 - 110 mmol/L 100 105 100  CO2 20 - 32 mmol/L 32 27 29  Calcium 8.6 - 10.4 mg/dL 9.5 9.5 9.5  Total Protein 6.1 - 8.1 g/dL 7.1 7.2 6.9  Total Bilirubin 0.2 - 1.2 mg/dL 0.5 0.6 0.5  Alkaline Phos 33 - 130 U/L - - 88  AST 10 - 35 U/L _2 ALT 6 - 29 U/L _3 Imaging: No results found.  Speciality Comments: No specialty comments available.    Procedures:  No procedures performed Allergies: Sulfa antibiotics and Latex   Assessment / Plan:     Visit Diagnoses: Rheumatoid arthritis with positive  rheumatoid factor  - +RF, -ANA, +CCP, elevated ESR, erosive: She has no synovitis on exam today.  She has not had any recent flares of her rheumatoid arthritis.  She is clinically doing well on Humira every other week.  She has not missed any doses recently.  She will continue on this current treatment regimen.  High risk medication use - Humira 40 mg sq every other week.  She will be due for CBC and CMP in May and every 3 months to monitor for drug toxicity.  Primary osteoarthritis of both hands: She has DIP synovial thickening consistent with osteoarthritis.  Joint protection muscle strengthening were discussed.  Primary osteoarthritis of both knees - chondromalacia patella: She has bilateral knee crepitus.  She has warmth of her right knee but no effusion.  She experiences discomfort in her right knee occasionally.  She uses Voltaren gel which relieves her pain.  Other medical conditions are listed as follows:  G6PD deficiency ([place])  History of hypertension  History of depression  History of sleep apnea  History of nephrectomy - right  History of ankle fracture  Ex-smoker    Orders: No orders of the  defined types were placed in this encounter.  No orders of the defined types were placed in this encounter.   Face-to-face time spent with patient was 30 minutes. >50% of time was spent in counseling and coordination of care.  Follow-Up Instructions: Return in about 5 months (around 03/10/2018) for Rheumatoid arthritis, Osteoarthritis.   Ofilia Neas, PA-C  I examined and evaluated the patient with Hazel Sams PA. The plan of care was discussed as noted above.  Bo Merino, MD  Note - This record has been created using Editor, commissioning.  Chart creation errors have been sought, but may not always  have been located. Such creation errors do not reflect on  the standard of medical care.

## 2017-10-08 ENCOUNTER — Encounter: Payer: Self-pay | Admitting: Rheumatology

## 2017-10-08 ENCOUNTER — Ambulatory Visit: Payer: Medicare Other | Admitting: Rheumatology

## 2017-10-08 VITALS — BP 145/76 | HR 68 | Resp 14 | Ht 62.0 in | Wt 160.5 lb

## 2017-10-08 DIAGNOSIS — Z87891 Personal history of nicotine dependence: Secondary | ICD-10-CM | POA: Diagnosis not present

## 2017-10-08 DIAGNOSIS — Z8781 Personal history of (healed) traumatic fracture: Secondary | ICD-10-CM | POA: Diagnosis not present

## 2017-10-08 DIAGNOSIS — M19041 Primary osteoarthritis, right hand: Secondary | ICD-10-CM

## 2017-10-08 DIAGNOSIS — M170 Bilateral primary osteoarthritis of knee: Secondary | ICD-10-CM

## 2017-10-08 DIAGNOSIS — M19042 Primary osteoarthritis, left hand: Secondary | ICD-10-CM | POA: Diagnosis not present

## 2017-10-08 DIAGNOSIS — D550 Anemia due to glucose-6-phosphate dehydrogenase [G6PD] deficiency: Secondary | ICD-10-CM

## 2017-10-08 DIAGNOSIS — M0579 Rheumatoid arthritis with rheumatoid factor of multiple sites without organ or systems involvement: Secondary | ICD-10-CM | POA: Diagnosis not present

## 2017-10-08 DIAGNOSIS — Z8669 Personal history of other diseases of the nervous system and sense organs: Secondary | ICD-10-CM

## 2017-10-08 DIAGNOSIS — D75A Glucose-6-phosphate dehydrogenase (G6PD) deficiency without anemia: Secondary | ICD-10-CM

## 2017-10-08 DIAGNOSIS — Z905 Acquired absence of kidney: Secondary | ICD-10-CM

## 2017-10-08 DIAGNOSIS — Z8679 Personal history of other diseases of the circulatory system: Secondary | ICD-10-CM

## 2017-10-08 DIAGNOSIS — Z8659 Personal history of other mental and behavioral disorders: Secondary | ICD-10-CM | POA: Diagnosis not present

## 2017-10-08 DIAGNOSIS — Z79899 Other long term (current) drug therapy: Secondary | ICD-10-CM | POA: Diagnosis not present

## 2017-10-08 NOTE — Patient Instructions (Signed)
Standing Labs We placed an order today for your standing lab work.    Please come back and get your standing labs in May and every 3 months  We have open lab Monday through Friday from 8:30-11:30 AM and 1:30-4:00 PM  at the office of Dr. Shaili Deveshwar.   You may experience shorter wait times on Monday and Friday afternoons. The office is located at [address], [address], [place], [place]  [address] No appointment is necessary.   Labs are drawn by [facility].  You may receive a bill from [facility] for your lab work. If you have any questions regarding directions or hours of operation,  please call [phone].    

## 2017-11-11 ENCOUNTER — Other Ambulatory Visit: Payer: Self-pay | Admitting: *Deleted

## 2017-11-11 NOTE — Telephone Encounter (Signed)
Refill request received via fax from [facility]   Last Visit: 10/08/17 Next Visit: 03/13/18 Labs: 08/04/17 Stable TB gold: 05/07/17 Neg   Patient will update labs 11/12/17  Okay to refill per Dr. Corliss Skains. Prescription faxed to [facility]

## 2017-11-12 ENCOUNTER — Other Ambulatory Visit: Payer: Self-pay

## 2017-11-12 ENCOUNTER — Telehealth: Payer: Self-pay | Admitting: Rheumatology

## 2017-11-12 DIAGNOSIS — Z79899 Other long term (current) drug therapy: Secondary | ICD-10-CM

## 2017-11-12 LAB — CBC WITH DIFFERENTIAL/PLATELET
Basophils Absolute: 43 cells/uL (ref 0–200)
Basophils Relative: 0.6 %
EOS PCT: 10.4 %
Eosinophils Absolute: 749 cells/uL — ABNORMAL HIGH (ref 15–500)
HEMATOCRIT: 40.2 % (ref 35.0–45.0)
HEMOGLOBIN: 13.3 g/dL (ref 11.7–15.5)
LYMPHS ABS: 2102 {cells}/uL (ref 850–3900)
MCH: 28.8 pg (ref 27.0–33.0)
MCHC: 33.1 g/dL (ref 32.0–36.0)
MCV: 87 fL (ref 80.0–100.0)
MPV: 10.1 fL (ref 7.5–12.5)
Monocytes Relative: 8.8 %
NEUTROS ABS: 3672 {cells}/uL (ref 1500–7800)
Neutrophils Relative %: 51 %
Platelets: 325 10*3/uL (ref 140–400)
RBC: 4.62 10*6/uL (ref 3.80–5.10)
RDW: 11.7 % (ref 11.0–15.0)
Total Lymphocyte: 29.2 %
WBC: 7.2 10*3/uL (ref 3.8–10.8)
WBCMIX: 634 {cells}/uL (ref 200–950)

## 2017-11-12 LAB — COMPLETE METABOLIC PANEL WITH GFR
AG RATIO: 1.2 (calc) (ref 1.0–2.5)
ALBUMIN MSPROF: 3.7 g/dL (ref 3.6–5.1)
ALT: 15 U/L (ref 6–29)
AST: 22 U/L (ref 10–35)
Alkaline phosphatase (APISO): 90 U/L (ref 33–130)
BILIRUBIN TOTAL: 0.5 mg/dL (ref 0.2–1.2)
BUN / CREAT RATIO: 19 (calc) (ref 6–22)
BUN: 20 mg/dL (ref 7–25)
CALCIUM: 9.3 mg/dL (ref 8.6–10.4)
CO2: 30 mmol/L (ref 20–32)
Chloride: 103 mmol/L (ref 98–110)
Creat: 1.06 mg/dL — ABNORMAL HIGH (ref 0.50–0.99)
GFR, EST NON AFRICAN AMERICAN: 54 mL/min/{1.73_m2} — AB (ref >=60)
GFR, Est African American: 63 mL/min/{1.73_m2} (ref >=60)
Globulin: 3.2 g/dL (calc) (ref 1.9–3.7)
Glucose, Bld: 94 mg/dL (ref 65–99)
POTASSIUM: 3.5 mmol/L (ref 3.5–5.3)
Sodium: 140 mmol/L (ref 135–146)
TOTAL PROTEIN: 6.9 g/dL (ref 6.1–8.1)

## 2017-11-12 NOTE — Telephone Encounter (Signed)
Jill Side from [place] [employer] called stating that the prescription that was faxed for patient's Humira is missing Dr. Fatima Sanger signature and if it is citrate free.  Please call #[phone] opt 4 and opt 1 or Fax [phone]

## 2017-11-13 NOTE — Progress Notes (Signed)
Creatinine is mildly elevated.  Please advise patient to avoid all NSAIDs.

## 2017-11-14 NOTE — Telephone Encounter (Signed)
Verified prescription and gave verbal prescription to [facility] the pharmacist. They will process it and ship to patient.

## 2018-01-22 ENCOUNTER — Telehealth: Payer: Self-pay | Admitting: Pharmacy Technician

## 2018-01-22 NOTE — Telephone Encounter (Signed)
Left message for patient in regards to [facility] Assist renewal application. Will need to review income information and household occupants. Patient will also need to come in to sign form. Will follow up.  11:32 AM Dorthula Nettles, CPhT

## 2018-01-26 NOTE — Telephone Encounter (Signed)
Spoke to patient, she will come by today to sign [facility] forms and will bring income information.   1:48 PM Diana [name], CPhT

## 2018-01-28 NOTE — Telephone Encounter (Signed)
Submitted renewal application for Humira from [employer]. Will follow up to confirm receipt.   Will send documents to scan center.  Fax# [phone] Phone# [phone]  4:09 PM Larri Brewton Johnney Ou, CPhT

## 2018-02-02 NOTE — Telephone Encounter (Signed)
Received approval for Patient Assistance from [facility] Assist for Humira. Patient has been approved for assistance through 07/01/2019.  Will send document to scan center.  Phone#[phone]  2:40 PM Diana [name]

## 2018-02-03 NOTE — Telephone Encounter (Signed)
Received fax requesting prescription clarification from [employer] for Humira. Spoke to Rep Marisue Ivan, who transferred me to the pharmacist line. Per their records, patient has been receiving Citrate free Humira 40mg /0.34ml for at least 6 months, but rx was written for standard Humira 40mg /0.43ml. Transferred to ., who clarified that patient should continue on the Citrate free 40mg /0.74ml.  Will send document to scan center.  Phone# [phone]  10:35 AM , CPhT

## 2018-02-13 ENCOUNTER — Other Ambulatory Visit: Payer: Self-pay

## 2018-02-13 DIAGNOSIS — Z79899 Other long term (current) drug therapy: Secondary | ICD-10-CM

## 2018-02-14 LAB — CBC WITH DIFFERENTIAL/PLATELET
BASOS ABS: 50 {cells}/uL (ref 0–200)
BASOS PCT: 0.7 %
Eosinophils Absolute: 540 cells/uL — ABNORMAL HIGH (ref 15–500)
Eosinophils Relative: 7.5 %
HEMATOCRIT: 39.6 % (ref 35.0–45.0)
HEMOGLOBIN: 12.6 g/dL (ref 11.7–15.5)
LYMPHS ABS: 2074 {cells}/uL (ref 850–3900)
MCH: 28.6 pg (ref 27.0–33.0)
MCHC: 31.8 g/dL — AB (ref 32.0–36.0)
MCV: 90 fL (ref 80.0–100.0)
MPV: 9.9 fL (ref 7.5–12.5)
Monocytes Relative: 7.8 %
NEUTROS ABS: 3974 {cells}/uL (ref 1500–7800)
Neutrophils Relative %: 55.2 %
Platelets: 325 10*3/uL (ref 140–400)
RBC: 4.4 10*6/uL (ref 3.80–5.10)
RDW: 12.1 % (ref 11.0–15.0)
Total Lymphocyte: 28.8 %
WBC mixed population: 562 cells/uL (ref 200–950)
WBC: 7.2 10*3/uL (ref 3.8–10.8)

## 2018-02-14 LAB — COMPLETE METABOLIC PANEL WITH GFR
AG Ratio: 1.2 (calc) (ref 1.0–2.5)
ALT: 16 U/L (ref 6–29)
AST: 28 U/L (ref 10–35)
Albumin: 3.8 g/dL (ref 3.6–5.1)
Alkaline phosphatase (APISO): 87 U/L (ref 33–130)
BUN: 17 mg/dL (ref 7–25)
CALCIUM: 9.3 mg/dL (ref 8.6–10.4)
CO2: 27 mmol/L (ref 20–32)
Chloride: 100 mmol/L (ref 98–110)
Creat: 0.99 mg/dL (ref 0.50–0.99)
GFR, EST AFRICAN AMERICAN: 68 mL/min/{1.73_m2} (ref >=60)
GFR, EST NON AFRICAN AMERICAN: 59 mL/min/{1.73_m2} — AB (ref >=60)
GLUCOSE: 100 mg/dL — AB (ref 65–99)
Globulin: 3.3 g/dL (calc) (ref 1.9–3.7)
Potassium: 3.5 mmol/L (ref 3.5–5.3)
Sodium: 138 mmol/L (ref 135–146)
TOTAL PROTEIN: 7.1 g/dL (ref 6.1–8.1)
Total Bilirubin: 0.6 mg/dL (ref 0.2–1.2)

## 2018-02-27 NOTE — Progress Notes (Signed)
Office Visit Note  Patient: Diana [name]             Date of Birth: [birth year]           MRN: [id number]             PCP: Seward Carol, MD Referring: Seward Carol, MD Visit Date: 03/13/2018 Occupation: _0 @  Subjective:  Pain in hands and knees.   History of Present Illness: Diana [name] is a 67 y.o. female with history of seropositive rheumatoid arthritis.  She states she was in the process of moving and she lost her Humira injection.  She missed Humira injection for almost a month.  She states she started hurting in her both hands and her knee joints.  She started Humira about a week ago.  She has noticed some improvement in her hands and knee joints.  She still have some discomfort in her hands and knees.  She is having swelling in bilateral knee joints and bilateral ankle joints. Activities of Daily Living:  Patient reports morning stiffness for 24 hours.   Patient Denies nocturnal pain.  Difficulty dressing/grooming: Denies Difficulty climbing stairs: Reports Difficulty getting out of chair: Denies Difficulty using hands for taps, buttons, cutlery, and/or writing: Denies  Review of Systems  Constitutional: Positive for fatigue. Negative for night sweats, weight gain and weight loss.  HENT: Negative for mouth sores, trouble swallowing, trouble swallowing, mouth dryness and nose dryness.   Eyes: Negative for pain, redness, visual disturbance and dryness.  Respiratory: Negative for cough, shortness of breath and difficulty breathing.   Cardiovascular: Negative for chest pain, palpitations, hypertension, irregular heartbeat and swelling in legs/feet.  Gastrointestinal: Negative for blood in stool, constipation and diarrhea.  Endocrine: Negative for increased urination.  Genitourinary: Negative for difficulty urinating and vaginal dryness.  Musculoskeletal: Positive for arthralgias, joint pain, joint swelling and morning stiffness. Negative for myalgias, muscle weakness,  muscle tenderness and myalgias.  Skin: Negative for color change, rash, hair loss, skin tightness, ulcers and sensitivity to sunlight.  Allergic/Immunologic: Negative for susceptible to infections.  Neurological: Positive for numbness. Negative for dizziness, memory loss, night sweats and weakness.  Hematological: Negative for bruising/bleeding tendency and swollen glands.  Psychiatric/Behavioral: Negative for depressed mood and sleep disturbance. The patient is not nervous/anxious.     PMFS History:  Patient Active Problem List   Diagnosis Date Noted  . Rheumatoid arthritis with positive rheumatoid factor  05/14/2016  . High risk medication use 05/14/2016  . Primary osteoarthritis of both hands 05/14/2016  . Primary osteoarthritis of both knees 05/14/2016  . G6PD deficiency ([place]) 05/14/2016  . S/p nephrectomy 05/14/2016  . Essential hypertension 05/14/2016  . Depression 05/14/2016  . Sleep apnea 05/14/2016  . Former smoker 05/14/2016  . History of ankle fracture 05/14/2016  . Acute medial meniscus tear of right knee 10/08/2011    Class: Acute  . Osteoarthritis of right knee 10/08/2011    Class: Chronic    Past Medical History:  Diagnosis Date  . Depression   . Hyperlipidemia   . Hypertension   . 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 Rheumatoid arthritis(714.0)   . Sleep apnea    uses cpap-mod-severe    History reviewed. No pertinent family history. Past Surgical History:  Procedure Laterality Date  . ANKLE ARTHRODESIS  2010   left  . BREAST LUMPECTOMY  1998   rt  . BREAST SURGERY     rt breast bx  . CERVICAL BIOPSY    .  KIDNEY SURGERY    . NEPHRECTOMY  2007   right  . TUBAL LIGATION     Social History   Social History Narrative  . Not on file    Objective: Vital Signs: BP (!) 147/70 (BP Location: Left Arm, Patient Position: Sitting, Cuff Size: Normal)   Pulse 70   Resp 16   Ht _0  (1.575 m)   Wt 157 lb 3.2 oz (71.3 kg)   BMI 28.75 kg/m    Physical  Exam  Constitutional: She is oriented to person, place, and time. She appears well-developed and well-nourished.  HENT:  Head: Normocephalic and atraumatic.  Eyes: Conjunctivae and EOM are normal.  Neck: Normal range of motion.  Cardiovascular: Normal rate, regular rhythm, normal heart sounds and intact distal pulses.  Pulmonary/Chest: Effort normal and breath sounds normal.  Abdominal: Soft. Bowel sounds are normal.  Lymphadenopathy:    She has no cervical adenopathy.  Neurological: She is alert and oriented to person, place, and time.  Skin: Skin is warm and dry. Capillary refill takes less than 2 seconds.  Psychiatric: She has a normal mood and affect. Her behavior is normal.  Nursing note and vitals reviewed.    Musculoskeletal Exam: C-spine thoracic lumbar spine good range of motion.  Shoulder joints elbow joints were in good range of motion.  She has limited range of motion of bilateral wrist joint and synovial thickening over MCPs with no synovitis noted.  Hip joints were in good range of motion.  She has limited extension of her right knee joint with swelling.  She had some discomfort range of motion of her left knee joint with some warmth.  She has swelling in her right ankle joint.   CDAI Exam: CDAI Score: 5.2  Patient Global Assessment: 5 (mm); Provider Global Assessment: 7 (mm) Swollen: 3 ; Tender: 3  Joint Exam      Right  Left  Knee  Swollen Tender  Swollen Tender  Ankle  Swollen Tender        Investigation: No additional findings.  Imaging: Xr Foot 2 Views Left  Result Date: 03/13/2018 Narrowing of all MTP joints was noted.  Juxta-articular osteopenia was noted.  PIP and DIP narrowing was noted.  Some dorsal spurring was noted.  Surgical fusion of tibiotalar joint was noted.  Hardware noted. Impression: These findings are consistent with rheumatoid arthritis and osteoarthritis overlap.  Xr Foot 2 Views Right  Result Date: 03/13/2018 Narrowing of all MTP PIP  and DIP joints was noted.  Juxta-articular osteopenia was noted.  Intertarsal joint space narrowing was noted.  No subtalar joint space narrowing was noted. Impression: These findings are consistent with rheumatoid arthritis and osteoarthritis overlap.  Xr Hand 2 View Left  Result Date: 03/13/2018 PIP/DIP and CMC narrowing was noted.  Narrowing of first second third MCP joint with subluxation of first MCP was noted.  Juxta-articular osteopenia was noted.  Erosive changes were noted in the base of first metacarpal.  Intercarpal and radiocarpal joint space narrowing was noted.  A foreign body was noted in the second middle phalanx. Impression: These findings are consistent with osteoarthritis and rheumatoid arthritis overlap.  Xr Hand 2 View Right  Result Date: 03/13/2018 PIP/DIP narrowing was noted.  Subluxation of first MCP joint with erosive changes were noted.  Narrowing of all MCP joints with juxta-articular osteopenia was noted.  Narrowing of intercarpal radiocarpal joint space narrowing was noted.  Erosive changes were noted in the carpal bones. Impression: These findings are consistent  with erosive rheumatoid arthritis and osteoarthritis overlap.  Xr Knee 3 View Left  Result Date: 03/13/2018 Moderate medial compartment narrowing was noted.  No chondrocalcinosis was noted.  Intercondylar osteophytes were noted.  Severe patellofemoral narrowing was noted. Impression: These findings are consistent with moderate osteoarthritis and severe patellofemoral narrowing.  Xr Knee 3 View Right  Result Date: 03/13/2018 Severe medial compartment narrowing with medial osteophytes was noted.  Lateral osteophytes and intercondylar osteophytes was noted.  No chondrocalcinosis was noted.  Severe patellofemoral narrowing was noted. Impression: These findings are consistent severe osteoarthritis and severe chondromalacia patella.   Recent Labs: Lab Results  Component Value Date   WBC 7.2 02/13/2018   HGB 12.6  02/13/2018   PLT 325 02/13/2018   NA 138 02/13/2018   K 3.5 02/13/2018   CL 100 02/13/2018   CO2 27 02/13/2018   GLUCOSE 100 (H) 02/13/2018   BUN 17 02/13/2018   CREATININE 0.99 02/13/2018   BILITOT 0.6 02/13/2018   ALKPHOS 88 11/22/2016   AST 28 02/13/2018   ALT 16 02/13/2018   PROT 7.1 02/13/2018   ALBUMIN 3.5 (L) 11/22/2016   CALCIUM 9.3 02/13/2018   GFRAA 68 02/13/2018   QFTBGOLD NEGATIVE 05/07/2017    Speciality Comments: No specialty comments available.  Procedures:  Large Joint Inj: R knee on 03/13/2018 11:19 AM Indications: pain Details: 27 G 1.5 in needle, medial approach  Arthrogram: No  Medications: 40 mg triamcinolone acetonide 40 MG/ML; 1.5 mL lidocaine 1 % Aspirate: 0 mL Outcome: tolerated well, no immediate complications Procedure, treatment alternatives, risks and benefits explained, specific risks discussed. Consent was given by the patient. Immediately prior to procedure a time out was called to verify the correct patient, procedure, equipment, support staff and site/side marked as required. Patient was prepped and draped in the usual sterile fashion.     Allergies: Sulfa antibiotics and Latex   Assessment / Plan:     Visit Diagnoses: Rheumatoid arthritis with positive rheumatoid factor  - +RF, -ANA, +CCP, elevated ESR, erosive disease.  She had a flare due to missing Humira dose.  She is resumed Humira now.  She still have significant synovitis in multiple joints as described above.  She wants to take only Humira monotherapy.  She is concerned about medications and their side effects because she has only one kidney.  I discussed adding additional meds including DMARDs but she declined.  High risk medication use - Humira 40 mg sq every other week.  Her labs have been stable.  We will continue to monitor her labs every 3 months.  Pain in both hands - Plan: XR Hand 2 View Right, XR Hand 2 View Left.  The x-rays were consistent with rheumatoid arthritis and  osteoarthritis overlap.  Primary osteoarthritis of both hands  Chronic pain of both knees - Plan: Large Joint Inj: R knee, XR KNEE 3 VIEW RIGHT, XR KNEE 3 VIEW LEFT.  She has severe medial compartment narrowing in the right knee and moderate in the left knee.  She also has severe patellofemoral narrowing bilaterally.  She had a lot of discomfort in her right knee joint.  After informed consent was obtained right knee joint was injected with cortisone as described above.  She tolerated the procedure well.  Primary osteoarthritis of both knees - chondromalacia patella  Pain in both feet - Plan: XR Foot 2 Views Right, XR Foot 2 Views Left.  She has been having discomfort in her bilateral feet and ankles.  She does have  osteoarthritic and rheumatoid arthritis changes in her feet.  She also has postsurgical change in her ankle and hardware which causes discomfort.  G6PD deficiency ([place])  History of sleep apnea  History of depression  History of nephrectomy - right  History of hypertension  History of ankle fracture  Ex-smoker   Orders: Orders Placed This Encounter  Procedures  . Large Joint Inj: R knee  . XR Hand 2 View Right  . XR Hand 2 View Left  . XR KNEE 3 VIEW RIGHT  . XR KNEE 3 VIEW LEFT  . XR Foot 2 Views Right  . XR Foot 2 Views Left   Meds ordered this encounter  Medications  . diclofenac sodium (VOLTAREN) 1 % GEL    Sig: Apply 3 grams to 3 large joints, up to 3 times daily as needed.    Dispense:  3 Tube    Refill:  3    Voltaren Gel 3 grams to 3 large joints upto TID 3 TUBES with 3 refills    Face-to-face time spent with patient was 30 minutes. Greater than 50% of time was spent in counseling and coordination of care.  Follow-Up Instructions: Return in about 5 months (around 08/13/2018) for Rheumatoid arthritis, Osteoarthritis.   Bo Merino, MD  Note - This record has been created using Editor, commissioning.  Chart creation errors have been sought, but may  not always  have been located. Such creation errors do not reflect on  the standard of medical care.

## 2018-03-04 ENCOUNTER — Telehealth: Payer: Self-pay | Admitting: Rheumatology

## 2018-03-04 MED ORDER — DICLOFENAC SODIUM 1 % TD GEL: TRANSDERMAL | 3 refills | Status: DC

## 2018-03-04 NOTE — Telephone Encounter (Signed)
Patient requested a copy of lab results. A copy will be mailed to her home address. Patient also requested a refill of voltaren gel to be [facility] to [facility].   Last visit: 10/08/2017 Next visit: 03/13/2018  Okay to refill per Dr. Corliss Skains.

## 2018-03-04 NOTE — Telephone Encounter (Signed)
Patient left a voicemail stating she was returning Andrea's call regarding her lab results.  Patient is also requesting a prescription refill of her Diclofenac Sodium 1% Gel.

## 2018-03-13 ENCOUNTER — Ambulatory Visit (INDEPENDENT_AMBULATORY_CARE_PROVIDER_SITE_OTHER): Payer: Self-pay

## 2018-03-13 ENCOUNTER — Encounter: Payer: Self-pay | Admitting: Rheumatology

## 2018-03-13 ENCOUNTER — Ambulatory Visit: Payer: Medicare Other | Admitting: Rheumatology

## 2018-03-13 VITALS — BP 147/70 | HR 70 | Resp 16 | Ht 62.0 in | Wt 157.2 lb

## 2018-03-13 DIAGNOSIS — M25561 Pain in right knee: Secondary | ICD-10-CM

## 2018-03-13 DIAGNOSIS — M79672 Pain in left foot: Secondary | ICD-10-CM

## 2018-03-13 DIAGNOSIS — M170 Bilateral primary osteoarthritis of knee: Secondary | ICD-10-CM

## 2018-03-13 DIAGNOSIS — M79671 Pain in right foot: Secondary | ICD-10-CM | POA: Diagnosis not present

## 2018-03-13 DIAGNOSIS — Z8659 Personal history of other mental and behavioral disorders: Secondary | ICD-10-CM

## 2018-03-13 DIAGNOSIS — M25562 Pain in left knee: Secondary | ICD-10-CM

## 2018-03-13 DIAGNOSIS — M79642 Pain in left hand: Secondary | ICD-10-CM

## 2018-03-13 DIAGNOSIS — M79641 Pain in right hand: Secondary | ICD-10-CM | POA: Diagnosis not present

## 2018-03-13 DIAGNOSIS — G8929 Other chronic pain: Secondary | ICD-10-CM

## 2018-03-13 DIAGNOSIS — M0579 Rheumatoid arthritis with rheumatoid factor of multiple sites without organ or systems involvement: Secondary | ICD-10-CM

## 2018-03-13 DIAGNOSIS — M19041 Primary osteoarthritis, right hand: Secondary | ICD-10-CM

## 2018-03-13 DIAGNOSIS — Z905 Acquired absence of kidney: Secondary | ICD-10-CM

## 2018-03-13 DIAGNOSIS — Z8679 Personal history of other diseases of the circulatory system: Secondary | ICD-10-CM

## 2018-03-13 DIAGNOSIS — D550 Anemia due to glucose-6-phosphate dehydrogenase [G6PD] deficiency: Secondary | ICD-10-CM

## 2018-03-13 DIAGNOSIS — Z8781 Personal history of (healed) traumatic fracture: Secondary | ICD-10-CM

## 2018-03-13 DIAGNOSIS — Z87891 Personal history of nicotine dependence: Secondary | ICD-10-CM

## 2018-03-13 DIAGNOSIS — Z8669 Personal history of other diseases of the nervous system and sense organs: Secondary | ICD-10-CM

## 2018-03-13 DIAGNOSIS — M19042 Primary osteoarthritis, left hand: Secondary | ICD-10-CM

## 2018-03-13 DIAGNOSIS — Z79899 Other long term (current) drug therapy: Secondary | ICD-10-CM

## 2018-03-13 DIAGNOSIS — D75A Glucose-6-phosphate dehydrogenase (G6PD) deficiency without anemia: Secondary | ICD-10-CM

## 2018-03-13 MED ORDER — LIDOCAINE HCL 1 % IJ SOLN
1.5000 mL | INTRAMUSCULAR | Status: AC | PRN
  Administered 2018-03-13: 1.5 mL

## 2018-03-13 MED ORDER — TRIAMCINOLONE ACETONIDE 40 MG/ML IJ SUSP
40.0000 mg | INTRAMUSCULAR | Status: AC | PRN
  Administered 2018-03-13: 40 mg via INTRA_ARTICULAR

## 2018-03-13 MED ORDER — DICLOFENAC SODIUM 1 % TD GEL: TRANSDERMAL | 3 refills | Status: DC

## 2018-03-13 NOTE — Patient Instructions (Signed)
Standing Labs We placed an order today for your standing lab work.    Please come back and get your standing labs in November and every 3 months   We have open lab Monday through Friday from 8:30-11:30 AM and 1:30-4:00 PM  at the office of Dr. Mickala Laton.   You may experience shorter wait times on Monday and Friday afternoons. The office is located at [address], [address], [place], [place] [address] No appointment is necessary.   Labs are drawn by [facility].  You may receive a bill from [facility] for your lab work. If you have any questions regarding directions or hours of operation,  please call [phone].     

## 2018-03-31 IMAGING — MG 2D DIGITAL SCREENING BILATERAL MAMMOGRAM WITH CAD AND ADJUNCT TO
8 of 12 series · 8 of 28 positions shown · non-contrast
Comparison: Previous exam(s).

CLINICAL DATA: Screening.

EXAM:
2D DIGITAL SCREENING BILATERAL MAMMOGRAM WITH CAD AND ADJUNCT TOMO

[R MLO synth-2D]
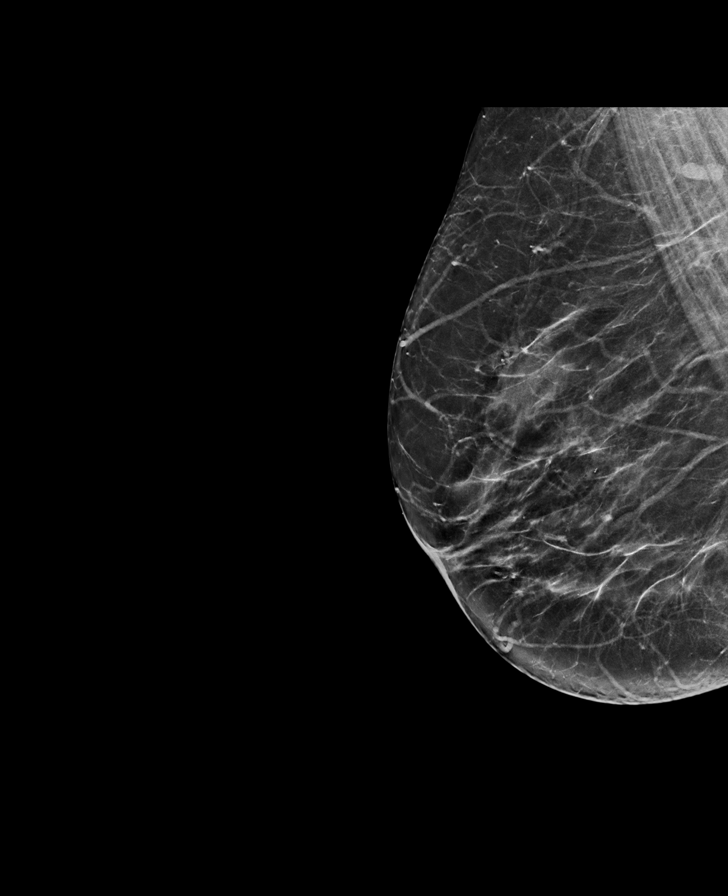

[L MLO]
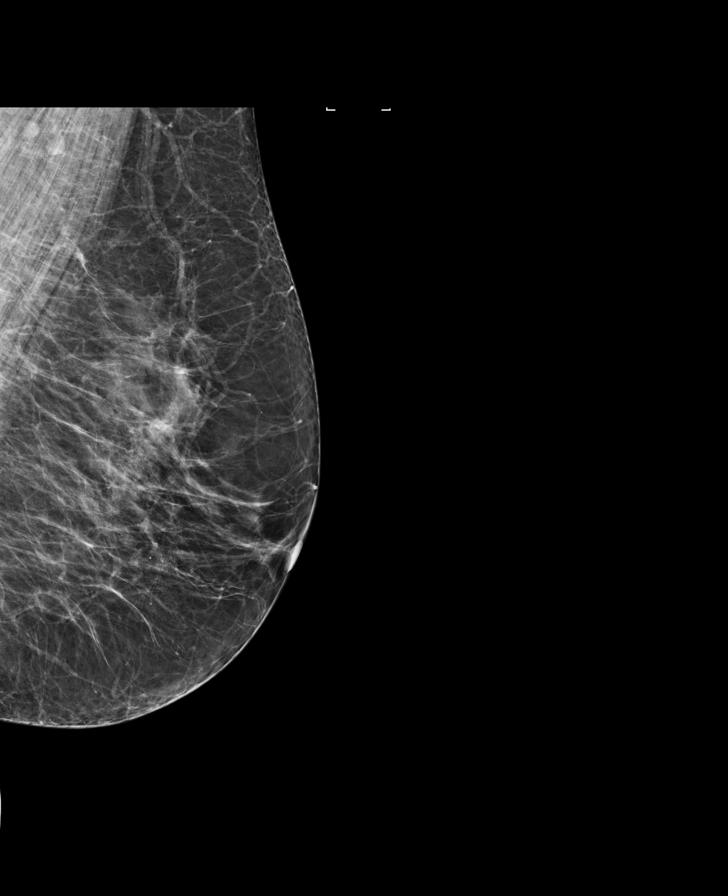

[L CC]
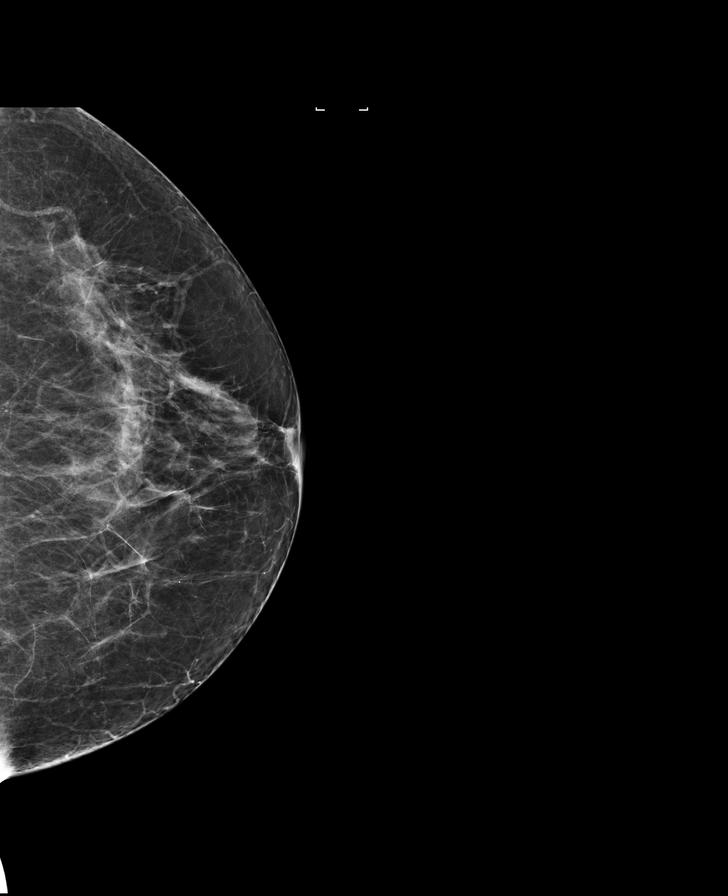

[L MLO synth-2D]
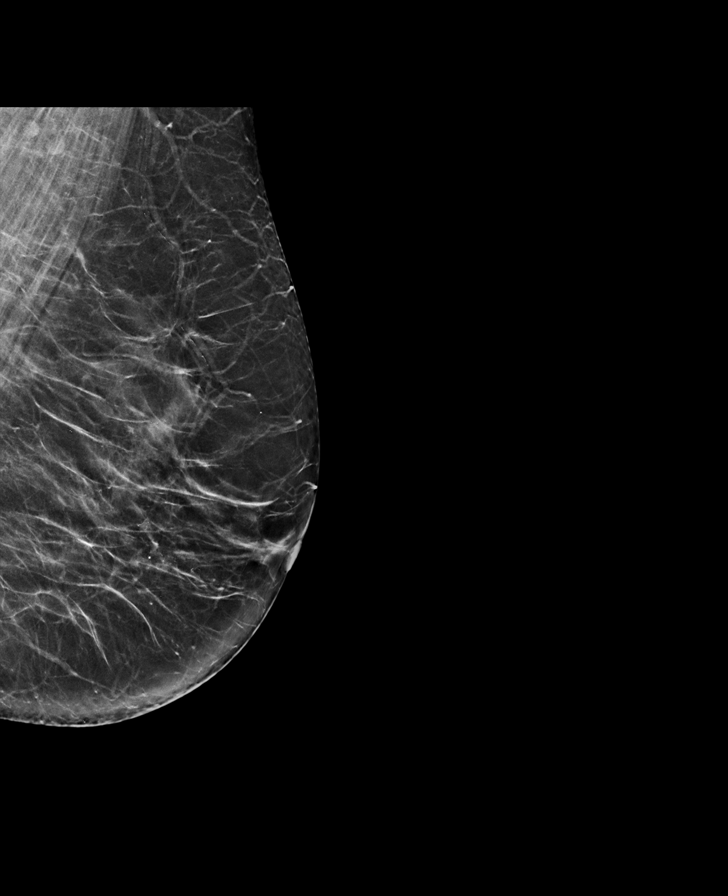

[R CC synth-2D]
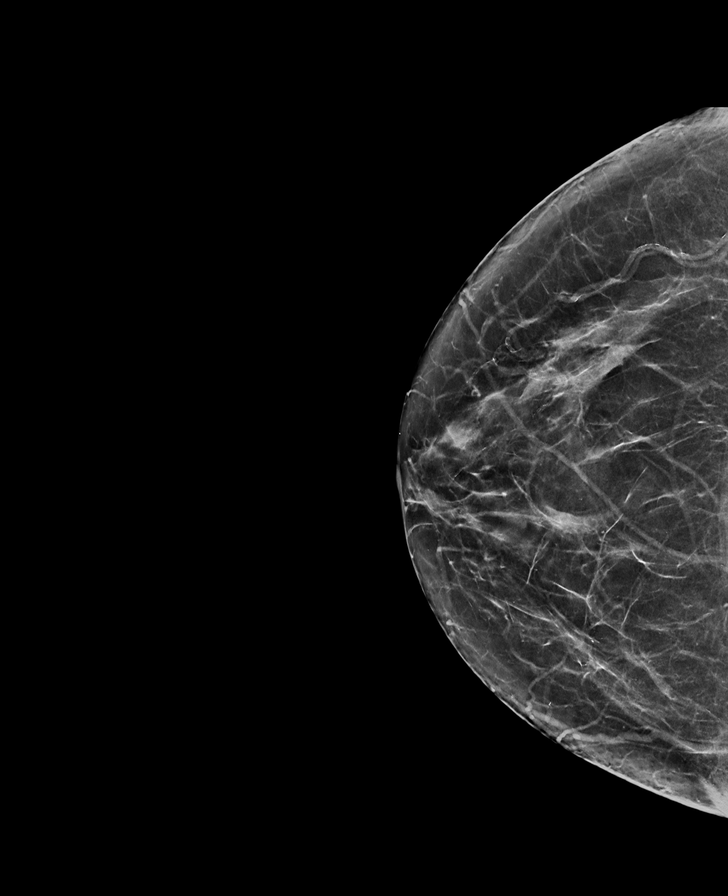

[R MLO]
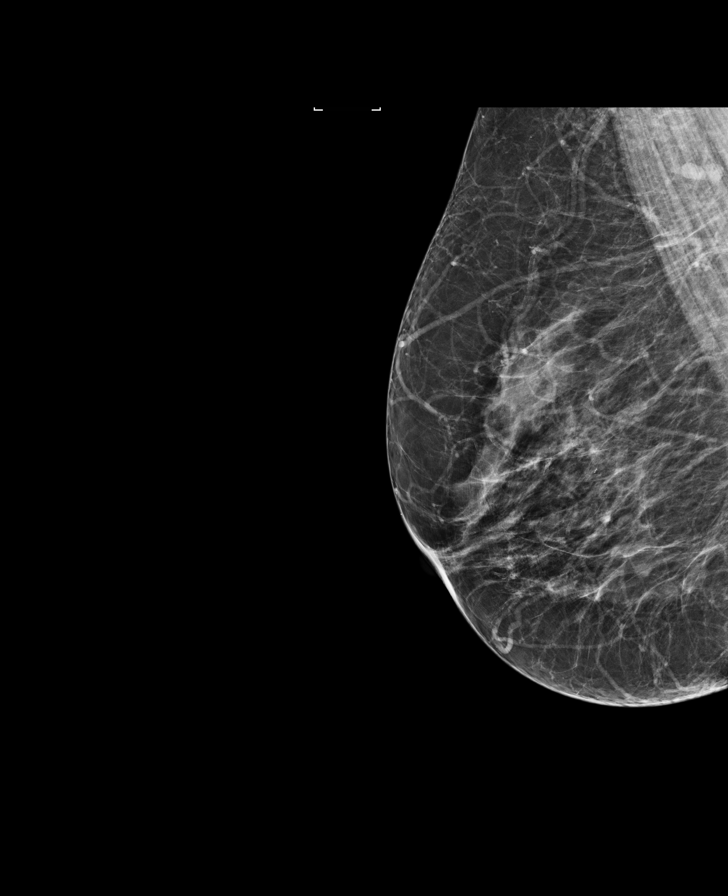

[R CC]
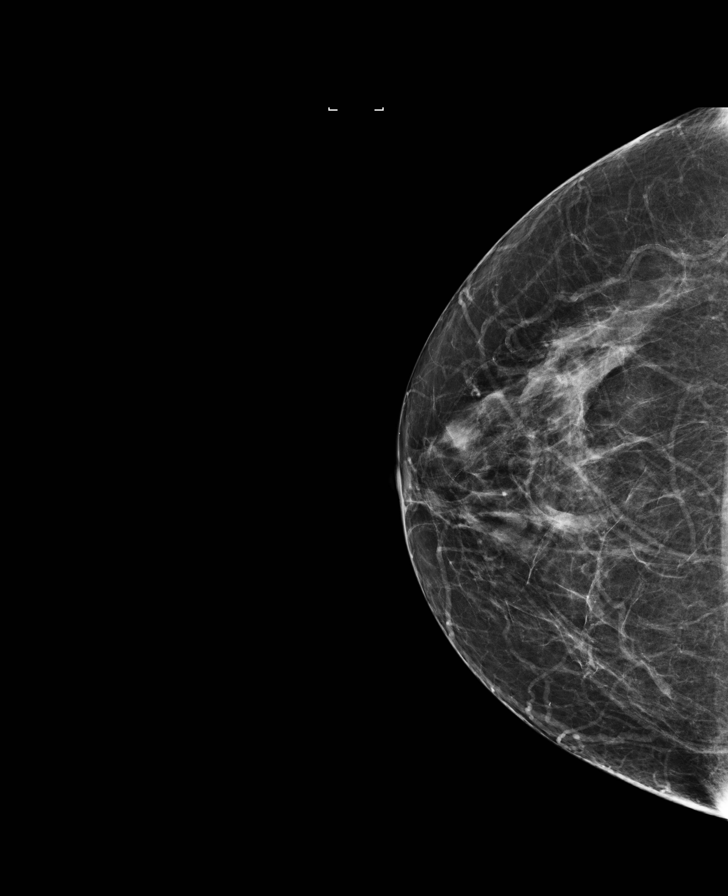

[L CC synth-2D]
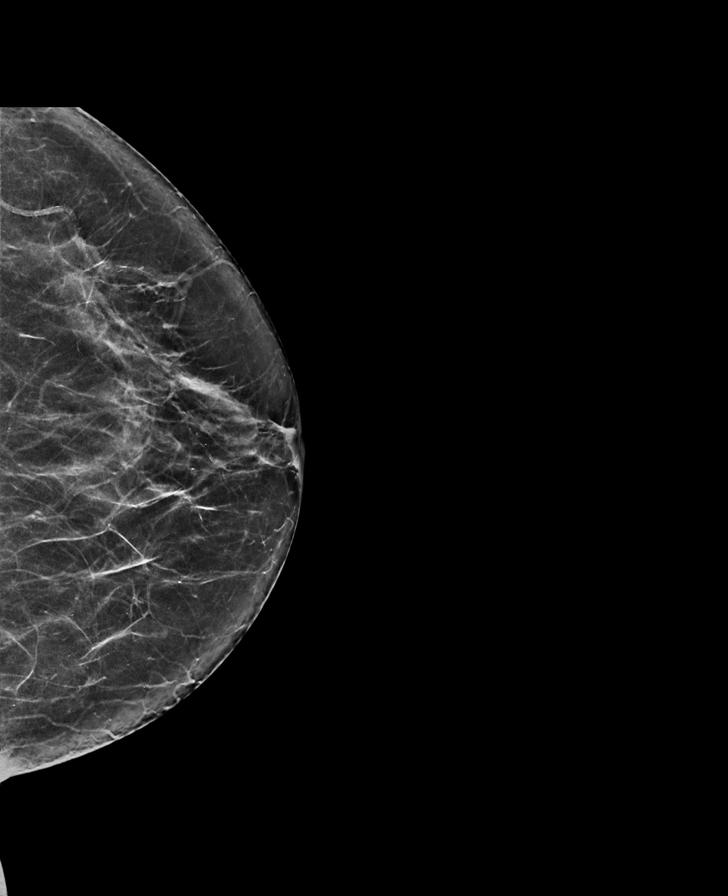

[8 of 28 positions shown; findings below may reference images not displayed]

ACR Breast Density Category b: There are scattered areas of
fibroglandular density.
FINDINGS: There are no findings suspicious for malignancy. Images were
processed with CAD.
IMPRESSION: No mammographic evidence of malignancy. A result letter of this
screening mammogram will be mailed directly to the patient.

RECOMMENDATION:
Screening mammogram in one year. (Code:97-6-RS4)

BI-RADS CATEGORY  1: Negative.

## 2018-04-30 ENCOUNTER — Telehealth: Payer: Self-pay | Admitting: Rheumatology

## 2018-04-30 DIAGNOSIS — Z9225 Personal history of immunosupression therapy: Secondary | ICD-10-CM

## 2018-04-30 MED ORDER — ADALIMUMAB 40 MG/0.8ML ~~LOC~~ AJKT: 40.0000 mg | AUTO-INJECTOR | SUBCUTANEOUS | 0 refills | Status: DC

## 2018-04-30 NOTE — Telephone Encounter (Signed)
Contacted the patient and advised [employer] has not contacted our office. Advised patient I would contact the [employer] company to find out what information they are in need of. Contacted [employer] and they are needing a prescription refill. Verbal authorization given as fax was never sent to our office. Patient advised.   Last Visit: 03/13/18  Next Visit: 08/14/18  Labs: 02/13/18 stable Tb Gold: 05/07/17 neg

## 2018-04-30 NOTE — Telephone Encounter (Signed)
Patient called stating she received a call from [facility] who told her they were waiting for information from Dr. Corliss Skains before refilling her prescription of Humira.  Patient states she only has one injection remaining.  Patient requested a return call.

## 2018-05-18 ENCOUNTER — Other Ambulatory Visit: Payer: Self-pay

## 2018-05-18 DIAGNOSIS — Z9225 Personal history of immunosupression therapy: Secondary | ICD-10-CM

## 2018-05-18 DIAGNOSIS — Z79899 Other long term (current) drug therapy: Secondary | ICD-10-CM

## 2018-05-19 NOTE — Progress Notes (Signed)
stable °

## 2018-05-20 LAB — CBC WITH DIFFERENTIAL/PLATELET
Basophils Absolute: 50 cells/uL (ref 0–200)
Basophils Relative: 0.7 %
EOS ABS: 724 {cells}/uL — AB (ref 15–500)
Eosinophils Relative: 10.2 %
HCT: 39.8 % (ref 35.0–45.0)
HEMOGLOBIN: 13.2 g/dL (ref 11.7–15.5)
Lymphs Abs: 1903 cells/uL (ref 850–3900)
MCH: 29.1 pg (ref 27.0–33.0)
MCHC: 33.2 g/dL (ref 32.0–36.0)
MCV: 87.9 fL (ref 80.0–100.0)
MONOS PCT: 8.3 %
MPV: 10 fL (ref 7.5–12.5)
NEUTROS ABS: 3834 {cells}/uL (ref 1500–7800)
Neutrophils Relative %: 54 %
Platelets: 348 10*3/uL (ref 140–400)
RBC: 4.53 10*6/uL (ref 3.80–5.10)
RDW: 12.1 % (ref 11.0–15.0)
TOTAL LYMPHOCYTE: 26.8 %
WBC mixed population: 589 cells/uL (ref 200–950)
WBC: 7.1 10*3/uL (ref 3.8–10.8)

## 2018-05-20 LAB — COMPLETE METABOLIC PANEL WITH GFR
AG RATIO: 1.3 (calc) (ref 1.0–2.5)
ALT: 11 U/L (ref 6–29)
AST: 16 U/L (ref 10–35)
Albumin: 3.8 g/dL (ref 3.6–5.1)
Alkaline phosphatase (APISO): 91 U/L (ref 33–130)
BILIRUBIN TOTAL: 0.5 mg/dL (ref 0.2–1.2)
BUN/Creatinine Ratio: 17 (calc) (ref 6–22)
BUN: 17 mg/dL (ref 7–25)
CHLORIDE: 102 mmol/L (ref 98–110)
CO2: 30 mmol/L (ref 20–32)
Calcium: 9.4 mg/dL (ref 8.6–10.4)
Creat: 1.03 mg/dL — ABNORMAL HIGH (ref 0.50–0.99)
GFR, EST AFRICAN AMERICAN: 65 mL/min/{1.73_m2} (ref >=60)
GFR, Est Non African American: 56 mL/min/{1.73_m2} — ABNORMAL LOW (ref >=60)
Globulin: 3 g/dL (calc) (ref 1.9–3.7)
Glucose, Bld: 100 mg/dL — ABNORMAL HIGH (ref 65–99)
POTASSIUM: 3.9 mmol/L (ref 3.5–5.3)
Sodium: 140 mmol/L (ref 135–146)
Total Protein: 6.8 g/dL (ref 6.1–8.1)

## 2018-05-20 LAB — QUANTIFERON-TB GOLD PLUS
NIL: 0.02 [IU]/mL
QUANTIFERON-TB GOLD PLUS: NEGATIVE
TB2-NIL: 0 IU/mL

## 2018-06-05 ENCOUNTER — Emergency Department (HOSPITAL_COMMUNITY): Payer: Medicare Other

## 2018-06-05 ENCOUNTER — Emergency Department (HOSPITAL_COMMUNITY)
Admission: EM | Admit: 2018-06-05 | Discharge: 2018-06-05 | Disposition: A | Discharge status: Home / Self Care | Payer: Medicare Other | Attending: Emergency Medicine | Admitting: Emergency Medicine

## 2018-06-05 ENCOUNTER — Ambulatory Visit (HOSPITAL_COMMUNITY)
Admission: EM | Admit: 2018-06-05 | Discharge: 2018-06-05 | Disposition: A | Discharge status: Home / Self Care | Payer: Medicare Other

## 2018-06-05 DIAGNOSIS — I10 Essential (primary) hypertension: Secondary | ICD-10-CM | POA: Insufficient documentation

## 2018-06-05 DIAGNOSIS — Z87891 Personal history of nicotine dependence: Secondary | ICD-10-CM | POA: Diagnosis not present

## 2018-06-05 DIAGNOSIS — R55 Syncope and collapse: Secondary | ICD-10-CM

## 2018-06-05 DIAGNOSIS — Z91040 Latex allergy status: Secondary | ICD-10-CM | POA: Diagnosis not present

## 2018-06-05 DIAGNOSIS — E785 Hyperlipidemia, unspecified: Secondary | ICD-10-CM | POA: Diagnosis not present

## 2018-06-05 DIAGNOSIS — Z79899 Other long term (current) drug therapy: Secondary | ICD-10-CM | POA: Diagnosis not present

## 2018-06-05 DIAGNOSIS — M25562 Pain in left knee: Secondary | ICD-10-CM | POA: Diagnosis not present

## 2018-06-05 NOTE — ED Notes (Signed)
ED Provider at bedside. 

## 2018-06-05 NOTE — ED Notes (Signed)
Patient transported to X-ray 

## 2018-06-05 NOTE — ED Provider Notes (Signed)
[facility] EMERGENCY DEPARTMENT Provider Note   CSN: [id number] Arrival date & time: 06/05/18  1108     History   Chief Complaint Chief Complaint  Patient presents with  . Knee Pain    HPI Diana [name] is a 67 y.o. female with past medical history of rheumatoid arthritis, osteoarthritis, hypertension, G6PD deficiency, presenting to the emergency department with acute onset of worsening chronic left knee pain that began earlier today.  She states she was making the bed and leaning into the bed frame.  She had immediate significant pain so she leaned forward to brace herself on the bed and passed out onto the carpet from the pain.  She did not hit her head.  She is not on blood thinners. No prodrome of chest pain, palpitations, headache. Reports it was sudden pain followed by syncope.   States she is currently at her baseline, however still has pain with to the left knee.  Pain is worse with palpation and movement.  Feels different than her normal arthritis pain.  Followed by [facility] orthopedics, per chart review last visit was in September with x-rays consistent with RA and OA.  No medications tried prior to arrival.  The history is provided by the patient and medical records.    Past Medical History:  Diagnosis Date  . Depression   . Hyperlipidemia   . Hypertension   . Positive PPD    works Engineer, maintenance ([employer])  . Rheumatoid arthritis(714.0)   . Sleep apnea    uses cpap-mod-severe    Patient Active Problem List   Diagnosis Date Noted  . Rheumatoid arthritis with positive rheumatoid factor  05/14/2016  . High risk medication use 05/14/2016  . Primary osteoarthritis of both hands 05/14/2016  . Primary osteoarthritis of both knees 05/14/2016  . G6PD deficiency 05/14/2016  . S/p nephrectomy 05/14/2016  . Essential hypertension 05/14/2016  . Depression 05/14/2016  . Sleep apnea 05/14/2016  . Former smoker 05/14/2016  . History of ankle fracture 05/14/2016  .  Acute medial meniscus tear of right knee 10/08/2011    Class: Acute  . Osteoarthritis of right knee 10/08/2011    Class: Chronic    Past Surgical History:  Procedure Laterality Date  . ANKLE ARTHRODESIS  2010   left  . BREAST LUMPECTOMY  1998   rt  . BREAST SURGERY     rt breast bx  . CERVICAL BIOPSY    . KIDNEY SURGERY    . NEPHRECTOMY  2007   right  . TUBAL LIGATION       OB History   None      Home Medications    Prior to Admission medications   Medication Sig Start Date End Date Taking? Authorizing Provider  Adalimumab (HUMIRA PEN) 40 MG/0.8ML PNKT Inject 40 mg into the skin every 14 (fourteen) days. 04/30/18   Pollyann Savoy, MD  Ascorbic Acid (VITAMIN C) 100 MG tablet Take by mouth.    [provider]  diclofenac sodium (VOLTAREN) 1 % GEL Apply 3 grams to 3 large joints, up to 3 times daily as needed. 03/13/18   Pollyann Savoy, MD  FLUoxetine (PROZAC) 10 MG capsule Take 10 mg by mouth daily.    [provider]  fluticasone (FLONASE) 50 MCG/ACT nasal spray Place into both nostrils as needed for allergies or rhinitis.    [provider]  Multiple Vitamins-Minerals (CENTRUM SILVER PO) Take 1 tablet by mouth daily.    [provider]  naproxen  sodium (ANAPROX) 220 MG tablet Take 440 mg by mouth 2 (two) times daily with a meal.    [provider]  Omega-3 Fatty Acids (FISH OIL) 1000 MG CAPS Take 1,000 mg by mouth.    [provider]  OVER THE COUNTER MEDICATION Take 1 tablet by mouth 2 (two) times daily. Nature's Own Hair, Skin, and Nails    [provider]  telmisartan (MICARDIS) 80 MG tablet Take 80 mg by mouth daily.    [provider]  telmisartan-hydrochlorothiazide (MICARDIS HCT) 80-25 MG tablet  01/05/18   [provider]    Family History No family history on file.  Social History Social History   Tobacco Use  . Smoking status: Former Smoker    Packs/day: 0.50    Years:  15.00    Pack years: 7.50    Types: Cigarettes    Last attempt to quit: 10/04/1995    Years since quitting: 22.6  . Smokeless tobacco: Never Used  Substance Use Topics  . Alcohol use: No  . Drug use: No     Allergies   Sulfa antibiotics and Latex   Review of Systems Review of Systems  Eyes: Negative for visual disturbance.  Musculoskeletal: Positive for arthralgias.  Neurological: Positive for syncope. Negative for light-headedness and headaches.  Hematological: Does not bruise/bleed easily.  All other systems reviewed and are negative.    Physical Exam Updated Vital Signs BP (!) 167/74   Pulse 66   Temp 98.5 F (36.9 C) (Oral)   Resp 16   Ht 5\' 2"  (1.575 m)   Wt 69.4 kg   SpO2 99%   BMI 27.98 kg/m   Physical Exam  Constitutional: She appears well-developed and well-nourished. No distress.  HENT:  Head: Normocephalic and atraumatic.  Eyes: Pupils are equal, round, and reactive to light. Conjunctivae and EOM are normal.  Neck: Normal range of motion. Neck supple.  Cardiovascular: Normal rate and regular rhythm.  Pulmonary/Chest: Effort normal and breath sounds normal. No respiratory distress.  Abdominal: Soft. Bowel sounds are normal. There is no tenderness.  Musculoskeletal:  Diffuse tenderness to the anterior aspect of the left knee as well as posterior aspect.  Pain with flexion past 45 degrees.  No swelling, erythema, or warmth to the joint.  No obvious deformity.  Neurological: She is alert. She displays normal reflexes. No cranial nerve deficit or sensory deficit. She exhibits normal muscle tone. Coordination normal.  Skin: Skin is warm.  Psychiatric: She has a normal mood and affect. Her behavior is normal.  Nursing note and vitals reviewed.    ED Treatments / Results  Labs (all labs ordered are listed, but only abnormal results are displayed) Labs Reviewed - No data to display  EKG None  Radiology Dg Knee Complete 4 Views Left  Result Date:  06/05/2018 CLINICAL DATA:  Left anterior knee pain since this morning after leaning into the bar of her bed. EXAM: LEFT KNEE - COMPLETE 4+ VIEW COMPARISON:  None. FINDINGS: Minimal medial and lateral spur formation. Mild patellofemoral spur formation. Moderate-sized anterior patellar enthesophytes. No fracture, dislocation or effusion. IMPRESSION: Degenerative changes, as described above. No acute abnormality. Electronically Signed   By: 14/11/2017 M.D.   On: 06/05/2018 14:36    Procedures Procedures (including critical care time)  Medications Ordered in ED Medications - No data to display   Initial Impression / Assessment and Plan / ED Course  I have reviewed the triage vital signs and the nursing notes.  Pertinent labs & imaging results that were available during my care of the patient were reviewed by me and considered in my medical decision making (see chart for details).     Patient with history of rheumatoid arthritis and osteoarthritis to bilateral knees, presenting with acute onset of severe pain to the left knee that occurred while making the bed.  The pain caused her to have a syncopal episode without head trauma.  Not on anticoagulation.  Feels at her baseline.  The pain clearly preceded the syncopal episode.  Exam is reassuring. Knee  is not warm, erythematous, or swollen and there is pain with flexion past 45 degrees and diffuse tenderness. No indication for septic arthritis or gout. X-ray with chronic degenerative changes. Knee immobilizer applied, cannot rule out meniscal injury. Pt has a walker at home.  Recommend symptomatic management and follow up with her ortho specialist. Pt is agreeable to plan and safe for discharge.  Patient discussed with and seen by Dr. Adriana Simas who guided workup and agrees with care plan.  Discussed results, findings, treatment and follow up. Patient advised of return precautions. Patient verbalized understanding and agreed with plan.  Final Clinical  Impressions(s) / ED Diagnoses   Final diagnoses:  Acute pain of left knee  Vasovagal syncope    ED Discharge Orders    None       Numa Heatwole, [place] N, PA-C 06/05/18 1500    Donnetta Hutching, MD 06/06/18 1801

## 2018-06-05 NOTE — ED Triage Notes (Signed)
Pt in c/o L knee pain that is chronic worsening in L knee, reports not being able to straigthen L leg today and passed out from pain, pt denies hitting head, landed on carpet floor, does not take blood thinners, A&Ox4

## 2018-06-05 NOTE — Discharge Instructions (Signed)
Please read instructions below. Apply ice to your knee for 20 minutes at a time. Apply you voltaren gel as prescribed. You can take tylenol every 4 hours as needed for pain. Schedule an appointment with your orthopedic specialist in 1 week. Return to the ER for new or concerning symptoms.

## 2018-06-15 ENCOUNTER — Ambulatory Visit (INDEPENDENT_AMBULATORY_CARE_PROVIDER_SITE_OTHER): Payer: Medicare Other | Admitting: Orthopaedic Surgery

## 2018-06-15 ENCOUNTER — Telehealth (INDEPENDENT_AMBULATORY_CARE_PROVIDER_SITE_OTHER): Payer: Self-pay | Admitting: *Deleted

## 2018-06-15 ENCOUNTER — Encounter (INDEPENDENT_AMBULATORY_CARE_PROVIDER_SITE_OTHER): Payer: Self-pay | Admitting: Orthopaedic Surgery

## 2018-06-15 VITALS — BP 111/67 | HR 77 | Ht 62.0 in | Wt 153.0 lb

## 2018-06-15 DIAGNOSIS — G8929 Other chronic pain: Secondary | ICD-10-CM | POA: Diagnosis not present

## 2018-06-15 DIAGNOSIS — M25561 Pain in right knee: Secondary | ICD-10-CM | POA: Diagnosis not present

## 2018-06-15 DIAGNOSIS — M25562 Pain in left knee: Secondary | ICD-10-CM

## 2018-06-15 NOTE — Telephone Encounter (Signed)
Please apply for visco for bil knees for Diana [name]. Thank you.

## 2018-06-15 NOTE — Progress Notes (Signed)
Office Visit Note   Patient: Diana [name]           Date of Birth: Dec 29, 1950           MRN: [id number] Visit Date: 06/15/2018              Requested by: Renford Dills, MD [address] [place], [place] [address] PCP: Renford Dills, MD   Assessment & Plan: Visit Diagnoses:  1. Chronic pain of both knees     Plan: History of rheumatoid arthritis with arthritic changes to both knees.  Recently had acute onset of left knee pain prompting emergency room visit.  Placed in knee immobilizer with negative x-rays.  Presently much better.  Long discussion regarding knee pain consistent with her arthritis.  Diana [name] would like to proceed with Visco supplementation in the right knee.  Will pre-CERT Follow-Up Instructions: Return pre cert visco.   Orders:  No orders of the defined types were placed in this encounter.  No orders of the defined types were placed in this encounter.     Procedures: No procedures performed   Clinical Data: No additional findings.   Subjective: Chief Complaint  Patient presents with  . Right Knee - Pain  . Knee Pain    sharp in left knee  about ago week, feel like it when numb , using ice and heat pain ,rest has help  Diana [name] has a history of rheumatoid arthritis and bilateral knee pain.  She is being followed by Dr.Deveshwar with Humira.  Approximately 10 days ago she had acute onset of left knee pain while working at home.  Seen in the emergency room with negative films.  Placed in a knee immobilizer relates that she is feeling much better.  Had a lot of questions concerning her arthritis and what she may expect over time.  She had inquired about Visco supplementation.. Will experienced occasional effusion both knees presently having more trouble on the right.  Prior films were performed in September revealing moderate medial compartment narrowing and severe patellofemoral narrowing the left.  Films of the right knee demonstrated severe  medial compartment narrowing without chondrocalcinosis.  Also had severe patellofemoral narrowing  HPI  Review of Systems  Cardiovascular: Positive for leg swelling.  Musculoskeletal: Positive for joint swelling.  Neurological: Positive for numbness.     Objective: Vital Signs: BP 111/67   Pulse 77   Ht 5\' 2"  (1.575 m)   Wt 153 lb (69.4 kg)   BMI 27.98 kg/m   Physical Exam Constitutional:      Appearance: She is well-developed.  Eyes:     Pupils: Pupils are equal, round, and reactive to light.  Pulmonary:     Effort: Pulmonary effort is normal.  Skin:    General: Skin is warm and dry.  Neurological:     Mental Status: She is alert and oriented to person, place, and time.  Psychiatric:        Behavior: Behavior normal.     Ortho Exam awake alert and oriented x3.  Comfortable sitting.  Examination of left knee there was very minimal effusion.  Normal alignment.  No opening with varus valgus stress.  Full extension about 105 degrees of flexion.  No distal edema.  Neurologically intact.  Predominant medial joint pain right knee with small effusion.  Some patellar crepitation.  Full extension but 105 degrees of flexion without instability.  No distal edema. Specialty Comments:  No specialty comments available.  Imaging:  No results found.   PMFS History: Patient Active Problem List   Diagnosis Date Noted  . Rheumatoid arthritis with positive rheumatoid factor  05/14/2016  . High risk medication use 05/14/2016  . Primary osteoarthritis of both hands 05/14/2016  . Primary osteoarthritis of both knees 05/14/2016  . G6PD deficiency 05/14/2016  . S/p nephrectomy 05/14/2016  . Essential hypertension 05/14/2016  . Depression 05/14/2016  . Sleep apnea 05/14/2016  . Former smoker 05/14/2016  . History of ankle fracture 05/14/2016  . Acute medial meniscus tear of right knee 10/08/2011    Class: Acute  . Osteoarthritis of right knee 10/08/2011    Class: Chronic   Past  Medical History:  Diagnosis Date  . Depression   . Hyperlipidemia   . Hypertension   . Positive PPD    works Engineer, maintenance (IT)  . Rheumatoid arthritis(714.0)   . Sleep apnea    uses cpap-mod-severe    History reviewed. No pertinent family history.  Past Surgical History:  Procedure Laterality Date  . ANKLE ARTHRODESIS  2010   left  . BREAST LUMPECTOMY  1998   rt  . BREAST SURGERY     rt breast bx  . CERVICAL BIOPSY    . KIDNEY SURGERY    . NEPHRECTOMY  2007   right  . TUBAL LIGATION     Social History   Occupational History  . Not on file  Tobacco Use  . Smoking status: Former Smoker    Packs/day: 0.50    Years: 15.00    Pack years: 7.50    Types: Cigarettes    Last attempt to quit: 10/04/1995    Years since quitting: 22.7  . Smokeless tobacco: Never Used  Substance and Sexual Activity  . Alcohol use: No  . Drug use: No  . Sexual activity: Not on file

## 2018-06-18 NOTE — Telephone Encounter (Signed)
Noted  

## 2018-07-16 ENCOUNTER — Telehealth (INDEPENDENT_AMBULATORY_CARE_PROVIDER_SITE_OTHER): Payer: Self-pay

## 2018-07-16 NOTE — Telephone Encounter (Signed)
Submitted VOB for Synvisc series, bilateral knee. 

## 2018-07-17 ENCOUNTER — Other Ambulatory Visit: Payer: Self-pay | Admitting: *Deleted

## 2018-07-17 ENCOUNTER — Telehealth (INDEPENDENT_AMBULATORY_CARE_PROVIDER_SITE_OTHER): Payer: Self-pay

## 2018-07-17 MED ORDER — ADALIMUMAB 40 MG/0.8ML ~~LOC~~ AJKT: 40.0000 mg | AUTO-INJECTOR | SUBCUTANEOUS | 0 refills | Status: DC

## 2018-07-17 NOTE — Telephone Encounter (Signed)
Please schedule patient and appointment with Dr. Corliss Skains or Ladona Ridgel for gel injection.  Thank you.  Patient is approved for Synvisc series, bilateral knee. Buy & Bill Covered at 80% of the allowed amount. Patient will be responsible for 20% OOP. May have a Co-pay of $35.00? No PA required

## 2018-07-17 NOTE — Telephone Encounter (Signed)
Refill request received via fax  Last Visit: 03/13/18  Next Visit: 08/14/18  Labs: 05/18/18 stable TB Gold: 05/18/18 Neg   Okay to refill per Dr. Corliss Skainseveshwar

## 2018-07-20 ENCOUNTER — Telehealth: Payer: Self-pay | Admitting: Rheumatology

## 2018-07-20 NOTE — Telephone Encounter (Signed)
Patient has been scheduled for 07/22/18 at 9:40 am.

## 2018-07-20 NOTE — Telephone Encounter (Signed)
Dr. Corliss Skains would like to schedule an appointment with this patient prior to her leaving to [place].  We can obtain updated labs at the office visit.

## 2018-07-20 NOTE — Progress Notes (Signed)
Office Visit Note  Patient: Diana [name]             Date of Birth: 1950/08/01           MRN: [id number]             PCP: Seward Carol, MD Referring: Seward Carol, MD Visit Date: 07/22/2018 Occupation: @GUAROCC @  Subjective:  Bilateral knee joint pain   History of Present Illness: Diana [name] is a 68 y.o. female with history of seropositive rheumatoid arthritis and osteoarthritis.  She tried spacing Humira to 21 days for her last injection.  Her last injection was on Tuesday.  She denies any recent RA flares. She has not missed any doses recently.  She reports about 1 month ago she felt as though she had the flu but she was not evaluated by PCP at that time.  She states her symptoms resolved pretty quickly.  She states she did receive the annual influenza vaccine.  She reports she continues to have pain in both knee joints. She states she has been using voltaren gel topically once daily.  She states she avoids taking OTC products for pain relief.  She states her knee joints stay swollen.  She states the pain is most severe in the right knee joint.  She states she was notified yesterday at there insurance covers visco gel injections, but she has not scheduled appointments for the injections yet.  She denies any other joint pain or joint swelling at this time.     Activities of Daily Living:  Patient reports morning stiffness for several hours.   Patient Reports nocturnal pain.  Difficulty dressing/grooming: Denies Difficulty climbing stairs: Denies Difficulty getting out of chair: Denies Difficulty using hands for taps, buttons, cutlery, and/or writing: Reports  Review of Systems  Constitutional: Negative for fatigue.  HENT: Positive for mouth sores. Negative for trouble swallowing, trouble swallowing, mouth dryness and nose dryness.   Eyes: Negative for pain, redness, itching, visual disturbance and dryness.  Respiratory: Negative for cough, hemoptysis, shortness of breath,  wheezing and difficulty breathing.   Cardiovascular: Negative for chest pain, palpitations, hypertension and swelling in legs/feet.  Gastrointestinal: Negative for abdominal pain, blood in stool, constipation and diarrhea.  Endocrine: Negative for increased urination.  Genitourinary: Negative for painful urination, nocturia and pelvic pain.  Musculoskeletal: Positive for arthralgias, joint pain, joint swelling and morning stiffness. Negative for myalgias, muscle weakness, muscle tenderness and myalgias.  Skin: Negative for color change, pallor, rash, hair loss, nodules/bumps, redness, skin tightness, ulcers and sensitivity to sunlight.  Allergic/Immunologic: Negative for susceptible to infections.  Neurological: Negative for dizziness, light-headedness, numbness, headaches, memory loss and weakness.  Hematological: Negative for swollen glands.  Psychiatric/Behavioral: Negative for depressed mood, confusion and sleep disturbance. The patient is not nervous/anxious.     PMFS History:  Patient Active Problem List   Diagnosis Date Noted  . Rheumatoid arthritis with positive rheumatoid factor  05/14/2016  . High risk medication use 05/14/2016  . Primary osteoarthritis of both hands 05/14/2016  . Primary osteoarthritis of both knees 05/14/2016  . G6PD deficiency 05/14/2016  . S/p nephrectomy 05/14/2016  . Essential hypertension 05/14/2016  . Depression 05/14/2016  . Sleep apnea 05/14/2016  . Former smoker 05/14/2016  . History of ankle fracture 05/14/2016  . Acute medial meniscus tear of right knee 10/08/2011    Class: Acute  . Osteoarthritis of right knee 10/08/2011    Class: Chronic    Past Medical History:  Diagnosis Date  .  Depression   . Hyperlipidemia   . Hypertension   . 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 Rheumatoid arthritis(714.0)   . Sleep apnea    uses cpap-mod-severe    History reviewed. No pertinent family history. Past Surgical History:  Procedure Laterality  Date  . ANKLE ARTHRODESIS  2010   left  . BREAST LUMPECTOMY  1998   rt  . BREAST SURGERY     rt breast bx  . CERVICAL BIOPSY    . KIDNEY SURGERY    . NEPHRECTOMY  2007   right  . TUBAL LIGATION     Social History   Social History Narrative  . Not on file    There is no immunization history on file for this patient.   Objective: Vital Signs: BP 134/79 (BP Location: Left Arm, Patient Position: Sitting, Cuff Size: Normal)   Pulse 79   Resp 14   Ht 5' 1"  (1.549 m)   Wt 151 lb (68.5 kg)   BMI 28.53 kg/m    Physical Exam Vitals signs and nursing note reviewed.  Constitutional:      Appearance: She is well-developed.  HENT:     Head: Normocephalic and atraumatic.  Eyes:     Conjunctiva/sclera: Conjunctivae normal.  Neck:     Musculoskeletal: Normal range of motion.  Cardiovascular:     Rate and Rhythm: Normal rate and regular rhythm.     Heart sounds: Normal heart sounds.  Pulmonary:     Effort: Pulmonary effort is normal.     Breath sounds: Normal breath sounds.  Abdominal:     General: Bowel sounds are normal.     Palpations: Abdomen is soft.  Lymphadenopathy:     Cervical: No cervical adenopathy.  Skin:    General: Skin is warm and dry.     Capillary Refill: Capillary refill takes less than 2 seconds.  Neurological:     Mental Status: She is alert and oriented to person, place, and time.  Psychiatric:        Behavior: Behavior normal.      Musculoskeletal Exam: C-spine, thoracic spine, and lumbar spine good ROM.  No midline spinal tenderness.  No SI joint tenderness.  Shoulder joints, elbow joints, wrist joints, MCPs, PIPs, and DIPs good ROM with no synovitis.  PIP and DIP synovial thickening consistent with osteoarthritis.  Bilateral 1st MCP joint synovial thickening.  Hip joints good ROM with no discomfort.  No tenderness over trochanteric bursa bilaterally.  Right knee joint limited extension.  Left knee good ROM.  No warmth or effusion of knee joints.   Bilateral knee joint crepitus.  No tenderness or swelling of ankle joints.  No tenderness over trochanteric bursa bilaterally.   CDAI Exam: CDAI Score: 0.8  Patient Global Assessment: 4 (mm); Provider Global Assessment: 4 (mm) Swollen: 0 ; Tender: 0  Joint Exam   Not documented   There is currently no information documented on the homunculus. Go to the Rheumatology activity and complete the homunculus joint exam.  Investigation: No additional findings.  Imaging: No results found.  Recent Labs: Lab Results  Component Value Date   WBC 7.1 05/18/2018   HGB 13.2 05/18/2018   PLT 348 05/18/2018   NA 140 05/18/2018   K 3.9 05/18/2018   CL 102 05/18/2018   CO2 30 05/18/2018   GLUCOSE 100 (H) 05/18/2018   BUN 17 05/18/2018   CREATININE 1.03 (H) 05/18/2018   BILITOT 0.5 05/18/2018   ALKPHOS 88 11/22/2016  AST 16 05/18/2018   ALT 11 05/18/2018   PROT 6.8 05/18/2018   ALBUMIN 3.5 (L) 11/22/2016   CALCIUM 9.4 05/18/2018   GFRAA 65 05/18/2018   QFTBGOLD NEGATIVE 05/07/2017   QFTBGOLDPLUS NEGATIVE 05/18/2018    Speciality Comments: No specialty comments available.  Procedures:  No procedures performed Allergies: Sulfa antibiotics and Latex   Assessment / Plan:     Visit Diagnoses: Rheumatoid arthritis with positive rheumatoid factor  - +RF, -ANA, +CCP, elevated ESR, erosive disease: She has no synovitis on exam.  She has not had any recent rheumatoid arthritis flares.  She was previously injecting Humira 40 mg subcutaneously every other week but space the dosing to 21 days for her last injection.  Her most recent injection was on 07/21/2018.  She did not develop any increased joint pain or swelling when spacing the dose of Humira. She continues to have chronic pain in bilateral knee joints.  No warmth or effusion was noted.  She will be proceeding with Visco gel injections for both knee joints once she returns to the [place].  She was provided 2 samples of Humira that she will take  with her in her vacation.  She was advised to notify us if she develops increased joint pain or joint swelling.  She will follow-up in the office when she returns from [place].   High risk medication use - Humira 40 mg sq every other week.  CBC and CMP were drawn on 05/18/2018.  TB gold was negative on 05/18/2018.  We will obtain lab work today since she will be leaving the country until June 2020.  She received the annual influenza vaccination.  Primary osteoarthritis of both hands: She has PIP and DIP synovial thickening consistent with osteoarthritis of bilateral hands.  She has bilateral CMC joint synovial thickening.  No synovitis was noted.  She has complete fist formation bilaterally.  Joint protection and muscle strengthening were discussed.  Primary osteoarthritis of both knees - chondromalacia patella: Chronic pain.  She is also extension of the right knee joint on exam.  She has been using Voltaren gel topically once daily.  She received a phone call yesterday about her insurance approved Visco gel injections.  She has not yet scheduled these appointments.  Other medical conditions are listed as follows:  G6PD deficiency  History of hypertension  History of depression  History of sleep apnea  History of nephrectomy - right  History of ankle fracture  Ex-smoker   Orders: Orders Placed This Encounter  Procedures  . COMPLETE METABOLIC PANEL WITH GFR  . CBC with Differential/Platelet   No orders of the defined types were placed in this encounter.    Follow-Up Instructions: Return for Rheumatoid arthritis, Osteoarthritis.   Ofilia Neas, PA-C  Note - This record has been created using Dragon software.  Chart creation errors have been sought, but may not always  have been located. Such creation errors do not reflect on  the standard of medical care.

## 2018-07-20 NOTE — Telephone Encounter (Signed)
Patient states she is travelling to [place]. Patient states she is leaving to go on 07/25/18 and will not be returning until 12/05/18. Prescription was sent to [facility] on 07/17/18. Patient will contact them to see if they can deliver before.   Last Visit: 03/13/18  Next Visit: 08/14/18  Labs: 05/18/18 stable TB Gold: 05/18/18 Neg   Patient's labs will be due while she is gone. Should we have patient get labs before she goes and the as soon as she returns?

## 2018-07-20 NOTE — Telephone Encounter (Signed)
Patient called stating she is leaving [place] on Saturday 07/25/18 and not returning until 12/05/18.  Patient is requesting 4 samples of her Humira so she can "stretch her medication out until she returns to [facility]."  Patient is requesting a return call.

## 2018-07-21 NOTE — Telephone Encounter (Signed)
Patient is leaving for [place] on 07/25/18 and will return on 12/05/18.  Patient will schedule Synvisc injections when she returns in June.

## 2018-07-22 ENCOUNTER — Ambulatory Visit: Payer: Medicare Other | Admitting: Physician Assistant

## 2018-07-22 ENCOUNTER — Encounter: Payer: Self-pay | Admitting: Physician Assistant

## 2018-07-22 VITALS — BP 134/79 | HR 79 | Resp 14 | Ht 61.0 in | Wt 151.0 lb

## 2018-07-22 DIAGNOSIS — Z8679 Personal history of other diseases of the circulatory system: Secondary | ICD-10-CM

## 2018-07-22 DIAGNOSIS — Z905 Acquired absence of kidney: Secondary | ICD-10-CM

## 2018-07-22 DIAGNOSIS — M19041 Primary osteoarthritis, right hand: Secondary | ICD-10-CM | POA: Diagnosis not present

## 2018-07-22 DIAGNOSIS — Z8669 Personal history of other diseases of the nervous system and sense organs: Secondary | ICD-10-CM

## 2018-07-22 DIAGNOSIS — M19042 Primary osteoarthritis, left hand: Secondary | ICD-10-CM

## 2018-07-22 DIAGNOSIS — M170 Bilateral primary osteoarthritis of knee: Secondary | ICD-10-CM

## 2018-07-22 DIAGNOSIS — Z8781 Personal history of (healed) traumatic fracture: Secondary | ICD-10-CM

## 2018-07-22 DIAGNOSIS — Z79899 Other long term (current) drug therapy: Secondary | ICD-10-CM

## 2018-07-22 DIAGNOSIS — Z87891 Personal history of nicotine dependence: Secondary | ICD-10-CM

## 2018-07-22 DIAGNOSIS — M0579 Rheumatoid arthritis with rheumatoid factor of multiple sites without organ or systems involvement: Secondary | ICD-10-CM | POA: Diagnosis not present

## 2018-07-22 DIAGNOSIS — D75A Glucose-6-phosphate dehydrogenase (G6PD) deficiency without anemia: Secondary | ICD-10-CM

## 2018-07-22 DIAGNOSIS — Z8659 Personal history of other mental and behavioral disorders: Secondary | ICD-10-CM

## 2018-07-22 NOTE — Progress Notes (Signed)
Medication Samples have been provided to the patient.  Drug name: Humira      Strength: 40 mg/ 0.4 mL      Qty: 3  LOT: [id number]  Exp.Date: 10/2019  Dosing instructions: Inject one pen into skin once every 14 days.  The patient has been instructed regarding the correct time, dose, and frequency of taking this medication, including desired effects and most common side effects.   Diana [name] 4:16 PM 07/22/2018

## 2018-07-22 NOTE — Patient Instructions (Signed)
Standing Labs We placed an order today for your standing lab work.    Please come back and get your standing labs in February and every 3 months   We have open lab Monday through Friday from 8:30-11:30 AM and 1:30-4:00 PM  at the office of Dr. Shaili Deveshwar.   You may experience shorter wait times on Monday and Friday afternoons. The office is located at [address], [address], [place], [place] [address] No appointment is necessary.   Labs are drawn by [facility].  You may receive a bill from [facility] for your lab work.  If you wish to have your labs drawn at another location, please call the office 24 hours in advance to send orders.  If you have any questions regarding directions or hours of operation,  please call [phone].   Just as a reminder please drink plenty of water prior to coming for your lab work. Thanks!   

## 2018-07-23 ENCOUNTER — Telehealth: Payer: Self-pay | Admitting: Rheumatology

## 2018-07-23 LAB — COMPLETE METABOLIC PANEL WITH GFR
AG Ratio: 1.1 (calc) (ref 1.0–2.5)
ALT: 12 U/L (ref 6–29)
AST: 18 U/L (ref 10–35)
Albumin: 3.8 g/dL (ref 3.6–5.1)
Alkaline phosphatase (APISO): 83 U/L (ref 33–130)
BILIRUBIN TOTAL: 0.5 mg/dL (ref 0.2–1.2)
BUN: 16 mg/dL (ref 7–25)
CO2: 30 mmol/L (ref 20–32)
Calcium: 9.9 mg/dL (ref 8.6–10.4)
Chloride: 102 mmol/L (ref 98–110)
Creat: 0.91 mg/dL (ref 0.50–0.99)
GFR, Est African American: 75 mL/min/{1.73_m2} (ref >=60)
GFR, Est Non African American: 65 mL/min/{1.73_m2} (ref >=60)
Globulin: 3.4 g/dL (calc) (ref 1.9–3.7)
Glucose, Bld: 84 mg/dL (ref 65–99)
Potassium: 4.1 mmol/L (ref 3.5–5.3)
SODIUM: 141 mmol/L (ref 135–146)
Total Protein: 7.2 g/dL (ref 6.1–8.1)

## 2018-07-23 LAB — CBC WITH DIFFERENTIAL/PLATELET
Absolute Monocytes: 469 cells/uL (ref 200–950)
Basophils Absolute: 59 cells/uL (ref 0–200)
Basophils Relative: 0.9 %
Eosinophils Absolute: 568 cells/uL — ABNORMAL HIGH (ref 15–500)
Eosinophils Relative: 8.6 %
HCT: 40.2 % (ref 35.0–45.0)
Hemoglobin: 13.4 g/dL (ref 11.7–15.5)
Lymphs Abs: 1630 cells/uL (ref 850–3900)
MCH: 29.5 pg (ref 27.0–33.0)
MCHC: 33.3 g/dL (ref 32.0–36.0)
MCV: 88.4 fL (ref 80.0–100.0)
MPV: 10.4 fL (ref 7.5–12.5)
Monocytes Relative: 7.1 %
NEUTROS PCT: 58.7 %
Neutro Abs: 3874 cells/uL (ref 1500–7800)
Platelets: 336 10*3/uL (ref 140–400)
RBC: 4.55 10*6/uL (ref 3.80–5.10)
RDW: 11.7 % (ref 11.0–15.0)
Total Lymphocyte: 24.7 %
WBC: 6.6 10*3/uL (ref 3.8–10.8)

## 2018-07-23 NOTE — Telephone Encounter (Signed)
Patient called stating she was returning your call.   °

## 2018-07-23 NOTE — Telephone Encounter (Signed)
Patient advised CMP WNL. Absolute eosinophils borderline elevated. Rest of CBC WNL.

## 2018-07-23 NOTE — Progress Notes (Signed)
CMP WNL. Absolute eosinophils borderline elevated. Rest of CBC WNL.

## 2018-08-14 ENCOUNTER — Ambulatory Visit: Payer: Medicare Other | Admitting: Rheumatology

## 2018-11-25 NOTE — Progress Notes (Signed)
Office Visit Note  Patient: Diana [name]             Date of Birth: 1951/02/07           MRN: [id number]             PCP: Renford Dills, MD Referring: Renford Dills, MD Visit Date: 12/09/2018 Occupation: @GUAROCC @  Subjective:  Right knee joint pain     History of Present Illness: Diana [name] is a 68 y.o. female with history of seropositive rheumatoid arthritis and osteoarthritis.  She is on Humira 40 mg sq every 14 days.  She has not missed any doses.  She presents today with right knee joint pain and swelling. She states the pain has been worse for the past 2 weeks. She denies any other joint pain or joint swelling.  She came back from [place] on Sunday.    Activities of Daily Living:  Patient reports morning stiffness for 20 minutes.   Patient Denies nocturnal pain.  Difficulty dressing/grooming: Denies Difficulty climbing stairs: Reports Difficulty getting out of chair: Denies Difficulty using hands for taps, buttons, cutlery, and/or writing: Denies  Review of Systems  Constitutional: Negative for fatigue.  HENT: Negative for mouth sores, mouth dryness and nose dryness.   Eyes: Negative for pain, visual disturbance and dryness.  Respiratory: Negative for cough, hemoptysis, shortness of breath and difficulty breathing.   Cardiovascular: Positive for swelling in legs/feet. Negative for chest pain, palpitations and hypertension.  Gastrointestinal: Negative for blood in stool, constipation and diarrhea.  Endocrine: Negative for increased urination.  Genitourinary: Negative for difficulty urinating and painful urination.  Musculoskeletal: Positive for arthralgias, joint pain, joint swelling, muscle weakness and morning stiffness. Negative for myalgias, muscle tenderness and myalgias.  Skin: Negative for color change, pallor, rash, hair loss, nodules/bumps, skin tightness, ulcers and sensitivity to sunlight.  Allergic/Immunologic: Negative for susceptible to infections.   Neurological: Negative for dizziness, numbness, headaches and weakness.  Hematological: Negative for bruising/bleeding tendency and swollen glands.  Psychiatric/Behavioral: Negative for depressed mood and sleep disturbance. The patient is not nervous/anxious.     PMFS History:  Patient Active Problem List   Diagnosis Date Noted  . Rheumatoid arthritis with positive rheumatoid factor  05/14/2016  . High risk medication use 05/14/2016  . Primary osteoarthritis of both hands 05/14/2016  . Primary osteoarthritis of both knees 05/14/2016  . G6PD deficiency 05/14/2016  . S/p nephrectomy 05/14/2016  . Essential hypertension 05/14/2016  . Depression 05/14/2016  . Sleep apnea 05/14/2016  . Former smoker 05/14/2016  . History of ankle fracture 05/14/2016  . Acute medial meniscus tear of right knee 10/08/2011    Class: Acute  . Osteoarthritis of right knee 10/08/2011    Class: Chronic    Past Medical History:  Diagnosis Date  . Depression   . Hyperlipidemia   . Hypertension   . Positive PPD    works Engineer, maintenance ([employer])  . Rheumatoid arthritis(714.0)   . Sleep apnea    uses cpap-mod-severe    History reviewed. No pertinent family history. Past Surgical History:  Procedure Laterality Date  . ANKLE ARTHRODESIS  2010   left  . BREAST LUMPECTOMY  1998   rt  . BREAST SURGERY     rt breast bx  . CERVICAL BIOPSY    . KIDNEY SURGERY    . NEPHRECTOMY  2007   right  . TUBAL LIGATION     Social History   Social History Narrative  . Not on file  Immunization History  Administered Date(s) Administered  . Influenza, High Dose Seasonal PF 04/10/2018  . Pneumococcal Polysaccharide-23 04/10/2018     Objective: Vital Signs: BP 124/66 (BP Location: Left Arm, Patient Position: Sitting, Cuff Size: Normal)   Pulse 70   Resp 16   Ht 5' 1.5" (1.562 m)   Wt 149 lb 9.6 oz (67.9 kg)   BMI 27.81 kg/m    Physical Exam Vitals signs and nursing note reviewed.  Constitutional:      Appearance:  She is well-developed.  HENT:     Head: Normocephalic and atraumatic.  Eyes:     Conjunctiva/sclera: Conjunctivae normal.  Neck:     Musculoskeletal: Normal range of motion.  Cardiovascular:     Rate and Rhythm: Normal rate and regular rhythm.     Heart sounds: Normal heart sounds.  Pulmonary:     Effort: Pulmonary effort is normal.     Breath sounds: Normal breath sounds.  Abdominal:     General: Bowel sounds are normal.     Palpations: Abdomen is soft.  Lymphadenopathy:     Cervical: No cervical adenopathy.  Skin:    General: Skin is warm and dry.     Capillary Refill: Capillary refill takes less than 2 seconds.  Neurological:     Mental Status: She is alert and oriented to person, place, and time.  Psychiatric:        Behavior: Behavior normal.      Musculoskeletal Exam: C-spine, thoracic spine, and lumbar spine good ROM.  No midline spinal tenderness.  No SI joint tenderness.  Shoulder joints, elbow joints, wrist joints, MCPs, PIPs, and DIPs good ROM with no synovitis.  Synovial thickening of bilateral 1st MCP joints.  Hip joints, knee joints, ankle joints, MTPs, PIPs, and DIPs good ROM with no synovitis. Right 2nd MTP joint synovial thickening.  Limited extension and warmth of right knee joint.  No knee joint effusion noted.  No tenderness or swelling of ankle joints.      CDAI Exam: CDAI Score: Not documented Patient Global Assessment: Not documented; Provider Global Assessment: Not documented Swollen: Not documented; Tender: Not documented Joint Exam   Not documented   There is currently no information documented on the homunculus. Go to the Rheumatology activity and complete the homunculus joint exam.  Investigation: No additional findings.  Imaging: No results found.  Recent Labs: Lab Results  Component Value Date   WBC 6.6 07/22/2018   HGB 13.4 07/22/2018   PLT 336 07/22/2018   NA 141 07/22/2018   K 4.1 07/22/2018   CL 102 07/22/2018   CO2 30  07/22/2018   GLUCOSE 84 07/22/2018   BUN 16 07/22/2018   CREATININE 0.91 07/22/2018   BILITOT 0.5 07/22/2018   ALKPHOS 88 11/22/2016   AST 18 07/22/2018   ALT 12 07/22/2018   PROT 7.2 07/22/2018   ALBUMIN 3.5 (L) 11/22/2016   CALCIUM 9.9 07/22/2018   GFRAA 75 07/22/2018   QFTBGOLD NEGATIVE 05/07/2017   QFTBGOLDPLUS NEGATIVE 05/18/2018    Speciality Comments: No specialty comments available.  Procedures:  Large Joint Inj: R knee on 12/09/2018 10:15 AM Indications: pain Details: 27 G 1.5 in needle, medial approach  Arthrogram: No  Medications: 1.5 mL lidocaine 1 %; 40 mg triamcinolone acetonide 40 MG/ML Aspirate: 0 mL Outcome: tolerated well, no immediate complications Procedure, treatment alternatives, risks and benefits explained, specific risks discussed. Consent was given by the patient. Immediately prior to procedure a time out was called to verify the  correct patient, procedure, equipment, support staff and site/side marked as required. Patient was prepped and draped in the usual sterile fashion.     Allergies: Sulfa antibiotics and Latex   Assessment / Plan:     Visit Diagnoses: Rheumatoid arthritis with positive rheumatoid factor: She presents today with right knee joint pain and warmth.  She has limited extension of the right knee on exam.  She has been having increased pain and swelling of the right knee joint for the past 2 weeks.  She has no other joint pain or joint swelling at this time.  She denied any other rheumatoid arthritis flares recently.  She is clinically doing well on Humira 40 mg subcutaneous injections every 14 days.  She has not missed any doses recently.  She will continue on this current treatment regimen.  She does not need any refills at this time.  She was advised to notify us if she develops increased joint pain or joint swelling.  She will follow-up in the office in 5 months.  High risk medication use - Humira 40 mg sq injection every 14 days. She  has not missed any doses recently.  She has not had any recent infections.  CBC and CMP will be drawn today to monitor for drug toxicity.  She will return in September and every 3 months for lab work.  TB gold updated today.  She requested a lipid panel to be checked. - Plan: CBC with Differential/Platelet, COMPLETE METABOLIC PANEL WITH GFR, CBC with Differential/Platelet, COMPLETE METABOLIC PANEL WITH GFR, QuantiFERON-TB Gold Plus  Primary osteoarthritis of both hands:   No tenderness or synovitis noted.  Complete fist formation.  She has no discomfort at this time.  Joint protection and muscle strengthening were discussed.   Primary osteoarthritis of both knees: Right knee has limited extension and warmth.  No joint effusion.  She has good ROM of the left knee joint.  She has intermittent right knee joint pain.  She has been having increased right knee pain and swelling for the past 2 weeks.  She has been more active recently, and she returned from [place] on Sunday.  She requested a right knee cortisone injection.  She tolerated the procedure well.  Procedure note completed above.   Chronic pain of right knee: She presents today with increased right knee pain.  She has warmth but no effusion on exam.  She has limited extension.  She requested a right knee cortisone injection.  She tolerated the procedure well.  She would like to apply for Visco gel injections.  We will call to schedule these injections once approved by her insurance.  Other medical conditions are listed as follows:   G6PD deficiency  History of hypertension  History of depression  History of sleep apnea  History of nephrectomy  History of ankle fracture  Ex-smoker  Lipid screening -She requested a lipid panel to be checked today. Plan: Lipid panel   Orders: Orders Placed This Encounter  Procedures  . Large Joint Inj  . CBC with Differential/Platelet  . COMPLETE METABOLIC PANEL WITH GFR  . CBC with  Differential/Platelet  . COMPLETE METABOLIC PANEL WITH GFR  . QuantiFERON-TB Gold Plus  . Lipid panel   No orders of the defined types were placed in this encounter.   Face-to-face time spent with patient was 30 minutes. Greater than 50% of time was spent in counseling and coordination of care.  Follow-Up Instructions: Return in about 5 months (around 05/11/2019) for Rheumatoid arthritis.  Ofilia Neas, PA-C  Note - This record has been created using Dragon software.  Chart creation errors have been sought, but may not always  have been located. Such creation errors do not reflect on  the standard of medical care.

## 2018-12-09 ENCOUNTER — Encounter: Payer: Self-pay | Admitting: Physician Assistant

## 2018-12-09 ENCOUNTER — Other Ambulatory Visit: Payer: Self-pay

## 2018-12-09 ENCOUNTER — Ambulatory Visit (INDEPENDENT_AMBULATORY_CARE_PROVIDER_SITE_OTHER): Payer: Medicare Other | Admitting: Physician Assistant

## 2018-12-09 VITALS — BP 124/66 | HR 70 | Resp 16 | Ht 61.5 in | Wt 149.6 lb

## 2018-12-09 DIAGNOSIS — Z8659 Personal history of other mental and behavioral disorders: Secondary | ICD-10-CM

## 2018-12-09 DIAGNOSIS — Z8679 Personal history of other diseases of the circulatory system: Secondary | ICD-10-CM

## 2018-12-09 DIAGNOSIS — Z79899 Other long term (current) drug therapy: Secondary | ICD-10-CM | POA: Diagnosis not present

## 2018-12-09 DIAGNOSIS — Z905 Acquired absence of kidney: Secondary | ICD-10-CM

## 2018-12-09 DIAGNOSIS — M170 Bilateral primary osteoarthritis of knee: Secondary | ICD-10-CM

## 2018-12-09 DIAGNOSIS — M0579 Rheumatoid arthritis with rheumatoid factor of multiple sites without organ or systems involvement: Secondary | ICD-10-CM | POA: Diagnosis not present

## 2018-12-09 DIAGNOSIS — M25561 Pain in right knee: Secondary | ICD-10-CM

## 2018-12-09 DIAGNOSIS — Z8781 Personal history of (healed) traumatic fracture: Secondary | ICD-10-CM

## 2018-12-09 DIAGNOSIS — Z87891 Personal history of nicotine dependence: Secondary | ICD-10-CM

## 2018-12-09 DIAGNOSIS — Z13220 Encounter for screening for lipoid disorders: Secondary | ICD-10-CM

## 2018-12-09 DIAGNOSIS — G8929 Other chronic pain: Secondary | ICD-10-CM

## 2018-12-09 DIAGNOSIS — M19042 Primary osteoarthritis, left hand: Secondary | ICD-10-CM

## 2018-12-09 DIAGNOSIS — D75A Glucose-6-phosphate dehydrogenase (G6PD) deficiency without anemia: Secondary | ICD-10-CM

## 2018-12-09 DIAGNOSIS — Z8669 Personal history of other diseases of the nervous system and sense organs: Secondary | ICD-10-CM

## 2018-12-09 DIAGNOSIS — M19041 Primary osteoarthritis, right hand: Secondary | ICD-10-CM

## 2018-12-09 MED ORDER — TRIAMCINOLONE ACETONIDE 40 MG/ML IJ SUSP
40.0000 mg | INTRAMUSCULAR | Status: AC | PRN
  Administered 2018-12-09: 40 mg via INTRA_ARTICULAR

## 2018-12-09 MED ORDER — LIDOCAINE HCL 1 % IJ SOLN
1.5000 mL | INTRAMUSCULAR | Status: AC | PRN
  Administered 2018-12-09: 1.5 mL

## 2018-12-09 NOTE — Patient Instructions (Signed)
Standing Labs We placed an order today for your standing lab work.    Please come back and get your standing labs in September and every 3 months  We have open lab daily Monday through Thursday from 8:30-12:30 PM and 1:30-4:30 PM and Friday from 8:30-12:30 PM and 1:30 -4:00 PM at the office of Dr. Shaili Deveshwar.   You may experience shorter wait times on Monday and Friday afternoons. The office is located at [address], [address], [place], [place] [address] No appointment is necessary.   Labs are drawn by [facility].  You may receive a bill from [facility] for your lab work.  If you wish to have your labs drawn at another location, please call the office 24 hours in advance to send orders.  If you have any questions regarding directions or hours of operation,  please call [phone].   Just as a reminder please drink plenty of water prior to coming for your lab work. Thanks!  

## 2018-12-10 ENCOUNTER — Telehealth: Payer: Self-pay | Admitting: *Deleted

## 2018-12-10 NOTE — Telephone Encounter (Signed)
Please apply for visco right knee for Diana [name]. Thank you.

## 2018-12-10 NOTE — Progress Notes (Signed)
Absolute eosinophils elevated. Rest of CBC WNL.  CMP WNL.  HDL is low and LDL is elevated.  Please notify patient and forward to PCP.

## 2018-12-11 ENCOUNTER — Telehealth: Payer: Self-pay

## 2018-12-11 LAB — CBC WITH DIFFERENTIAL/PLATELET
Absolute Monocytes: 460 cells/uL (ref 200–950)
Basophils Absolute: 53 cells/uL (ref 0–200)
Basophils Relative: 0.9 %
Eosinophils Absolute: 820 cells/uL — ABNORMAL HIGH (ref 15–500)
Eosinophils Relative: 13.9 %
HCT: 40.8 % (ref 35.0–45.0)
Hemoglobin: 13.2 g/dL (ref 11.7–15.5)
Lymphs Abs: 1805 cells/uL (ref 850–3900)
MCH: 29 pg (ref 27.0–33.0)
MCHC: 32.4 g/dL (ref 32.0–36.0)
MCV: 89.7 fL (ref 80.0–100.0)
MPV: 10.3 fL (ref 7.5–12.5)
Monocytes Relative: 7.8 %
Neutro Abs: 2761 cells/uL (ref 1500–7800)
Neutrophils Relative %: 46.8 %
Platelets: 309 10*3/uL (ref 140–400)
RBC: 4.55 10*6/uL (ref 3.80–5.10)
RDW: 12.1 % (ref 11.0–15.0)
Total Lymphocyte: 30.6 %
WBC: 5.9 10*3/uL (ref 3.8–10.8)

## 2018-12-11 LAB — COMPLETE METABOLIC PANEL WITH GFR
AG Ratio: 1.2 (calc) (ref 1.0–2.5)
ALT: 12 U/L (ref 6–29)
AST: 20 U/L (ref 10–35)
Albumin: 3.7 g/dL (ref 3.6–5.1)
Alkaline phosphatase (APISO): 81 U/L (ref 37–153)
BUN: 17 mg/dL (ref 7–25)
CO2: 30 mmol/L (ref 20–32)
Calcium: 9.6 mg/dL (ref 8.6–10.4)
Chloride: 103 mmol/L (ref 98–110)
Creat: 0.88 mg/dL (ref 0.50–0.99)
GFR, Est African American: 78 mL/min/{1.73_m2} (ref >=60)
GFR, Est Non African American: 67 mL/min/{1.73_m2} (ref >=60)
Globulin: 3.2 g/dL (calc) (ref 1.9–3.7)
Glucose, Bld: 78 mg/dL (ref 65–99)
Potassium: 3.9 mmol/L (ref 3.5–5.3)
Sodium: 141 mmol/L (ref 135–146)
Total Bilirubin: 0.6 mg/dL (ref 0.2–1.2)
Total Protein: 6.9 g/dL (ref 6.1–8.1)

## 2018-12-11 LAB — LIPID PANEL
Cholesterol: 192 mg/dL (ref <200)
HDL: 42 mg/dL — ABNORMAL LOW (ref >=50)
LDL Cholesterol (Calc): 125 mg/dL (calc) — ABNORMAL HIGH
Non-HDL Cholesterol (Calc): 150 mg/dL (calc) — ABNORMAL HIGH (ref <130)
Total CHOL/HDL Ratio: 4.6 (calc) (ref <5.0)
Triglycerides: 133 mg/dL (ref <150)

## 2018-12-11 LAB — QUANTIFERON-TB GOLD PLUS
Mitogen-NIL: 8.27 IU/mL
NIL: 0.01 IU/mL
QuantiFERON-TB Gold Plus: NEGATIVE
TB1-NIL: 0 IU/mL
TB2-NIL: 0 IU/mL

## 2018-12-11 NOTE — Telephone Encounter (Signed)
Noted  

## 2018-12-11 NOTE — Progress Notes (Signed)
TB gold negative

## 2018-12-11 NOTE — Telephone Encounter (Signed)
Resubmitted VOB for Synvisc series, right knee.  Patient has not received a gel injection.

## 2018-12-14 ENCOUNTER — Telehealth: Payer: Self-pay | Admitting: Rheumatology

## 2018-12-14 ENCOUNTER — Telehealth: Payer: Self-pay

## 2018-12-14 NOTE — Telephone Encounter (Signed)
Patient called stating she has done some research on her knee and doesn't think the gel injections will be effective.  Patient states her knee is painful and has been losing balance and is ready to discuss surgery.  Patient requested a return call from [facility] before scheduling an appointment with Dr. Durward Fortes.

## 2018-12-14 NOTE — Telephone Encounter (Signed)
I spoke with the patient.  She has noticed improvement since having a right knee joint cortisone injection on 12/09/18.  She states the inflammation has decreased but she continues to have some discomfort.  Her left knee joint has also started to cause increased discomfort due to compensating.  She would like to proceed with scheduling an appointment with Dr. Durward Fortes to discuss bilateral total knee replacements in the future.  Please call the patient to schedule an appointment with Dr. Durward Fortes.

## 2018-12-14 NOTE — Telephone Encounter (Signed)
The front desk will call patient to schedule with Dr. Durward Fortes.

## 2018-12-14 NOTE — Telephone Encounter (Signed)
Please schedule patient an appointment with Dr. Estanislado Pandy or Lovena Le for gel injection.  Thank You.  Approved for Synvisc series, right knee. Gary Patient will be responsible for 20% OOP. Co-pay of $35.00 required No PA required

## 2018-12-14 NOTE — Telephone Encounter (Signed)
Patient called stating she spoke with Lovena Le about scheduling an appointment with Dr. Durward Fortes to discuss surgery.  Patient doesn't think the gel injections are going to be effective and has decided not to get them at this time.

## 2018-12-15 ENCOUNTER — Encounter: Payer: Self-pay | Admitting: Orthopaedic Surgery

## 2018-12-15 ENCOUNTER — Ambulatory Visit: Payer: Self-pay

## 2018-12-15 ENCOUNTER — Ambulatory Visit (INDEPENDENT_AMBULATORY_CARE_PROVIDER_SITE_OTHER): Payer: Medicare Other | Admitting: Orthopaedic Surgery

## 2018-12-15 ENCOUNTER — Other Ambulatory Visit: Payer: Self-pay

## 2018-12-15 VITALS — BP 144/67 | HR 72 | Ht 61.5 in | Wt 149.0 lb

## 2018-12-15 DIAGNOSIS — M1711 Unilateral primary osteoarthritis, right knee: Secondary | ICD-10-CM

## 2018-12-15 DIAGNOSIS — D75A Glucose-6-phosphate dehydrogenase (G6PD) deficiency without anemia: Secondary | ICD-10-CM | POA: Diagnosis not present

## 2018-12-15 DIAGNOSIS — M170 Bilateral primary osteoarthritis of knee: Secondary | ICD-10-CM

## 2018-12-15 NOTE — Progress Notes (Signed)
Office Visit Note   Patient: Diana [name]           Date of Birth: Feb 06, 1951           MRN: [id number] Visit Date: 12/15/2018              Requested by: Renford Dills, MD [address] [place],  [place] [address] PCP: Renford Dills, MD   Assessment & Plan: Visit Diagnoses:  1. Bilateral primary osteoarthritis of knee   2. Primary osteoarthritis of right knee   3. G6PD deficiency   4. Primary osteoarthritis of both knees     Plan: End-stage osteoarthritis right knee Diana [name] would like to proceed with a total knee replacement.  I spent over 30 minutes discussing the above procedure with her.  I discussed the incision, potential complications.  Hospitalization of 24 hours, need for physical therapy, use of a walker.  She will need clearance from Dr. Nehemiah Settle.  She had a cortisone injection about a week ago and will have to wait at least 6 to 8 weeks before we proceed.  Follow-Up Instructions: Return We will schedule right total knee replacement.   Orders:  Orders Placed This Encounter  Procedures  . XR KNEE 3 VIEW RIGHT   No orders of the defined types were placed in this encounter.     Procedures: No procedures performed   Clinical Data: No additional findings.   Subjective: Chief Complaint  Patient presents with  . Left Knee - Pain  . Right Knee - Pain  Patient presents today with recurrent bilateral knee pain. She was last here in December of 2019. The right knee hurts more. She wants to talk about knee replacement today. She has cortisone injections in the past with Dr.Deveswhar, and does not want to continue doing those. Her last x-rays were taken in September of 2019.  Diana [name] has only 1 kidney and is concerned about taking anti-inflammatory medicines.  She is reached a point where her right knee really interferes with her activities of daily living as well as sleep compromise  HPI  Review of Systems   Objective: Vital Signs: BP (!)  144/67   Pulse 72   Ht 5' 1.5" (1.562 m)   Wt 149 lb (67.6 kg)   BMI 27.70 kg/m   Physical Exam Constitutional:      Appearance: She is well-developed.  Eyes:     Pupils: Pupils are equal, round, and reactive to light.  Pulmonary:     Effort: Pulmonary effort is normal.  Skin:    General: Skin is warm and dry.  Neurological:     Mental Status: She is alert and oriented to person, place, and time.  Psychiatric:        Behavior: Behavior normal.     Ortho Exam awake alert and oriented x3.  Comfortable sitting.  Has a mild limp referable to her right knee.  Possibly very small effusion.  Increased varus.  Lacked a few degrees to full extension right knee and flexed over 105 degrees without instability.  Some patellar crepitation but mostly medial joint pain.  Specialty Comments:  No specialty comments available.  Imaging: Xr Knee 3 View Right  Result Date: 12/15/2018 Films of the right knee were obtained in 3 projections standing.  There is significant decrease in the medial joint space with near bone-on-bone in about 3 to 4 degrees of varus.  There is subchondral sclerosis and small subchondral cyst medially and small  peripheral osteophytes.  Some patellofemoral arthritis.  Not much change from films performed 9 months ago.    PMFS History: Patient Active Problem List   Diagnosis Date Noted  . Rheumatoid arthritis with positive rheumatoid factor  05/14/2016  . High risk medication use 05/14/2016  . Primary osteoarthritis of both hands 05/14/2016  . G6PD deficiency 05/14/2016  . S/p nephrectomy 05/14/2016  . Essential hypertension 05/14/2016  . Depression 05/14/2016  . Sleep apnea 05/14/2016  . Former smoker 05/14/2016  . History of ankle fracture 05/14/2016  . Acute medial meniscus tear of right knee 10/08/2011    Class: Acute  . Osteoarthritis of right knee 10/08/2011    Class: Chronic   Past Medical History:  Diagnosis Date  . Depression   . Hyperlipidemia   .  Hypertension   . 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 Rheumatoid arthritis(714.0)   . Sleep apnea    uses cpap-mod-severe    History reviewed. No pertinent family history.  Past Surgical History:  Procedure Laterality Date  . ANKLE ARTHRODESIS  2010   left  . BREAST LUMPECTOMY  1998   rt  . BREAST SURGERY     rt breast bx  . CERVICAL BIOPSY    . KIDNEY SURGERY    . NEPHRECTOMY  2007   right  . TUBAL LIGATION     Social History   Occupational History  . Not on file  Tobacco Use  . Smoking status: Former Smoker    Packs/day: 0.50    Years: 15.00    Pack years: 7.50    Types: Cigarettes    Quit date: 10/04/1995    Years since quitting: 23.2  . Smokeless tobacco: Never Used  Substance and Sexual Activity  . Alcohol use: No  . Drug use: No  . Sexual activity: Not on file

## 2019-01-11 ENCOUNTER — Telehealth: Payer: Self-pay | Admitting: Orthopaedic Surgery

## 2019-01-11 NOTE — Telephone Encounter (Signed)
LMOM

## 2019-01-11 NOTE — Telephone Encounter (Signed)
Patient left a voicemail stating she has questions regarding her tentative surgery scheduled for August.  Patient requested a return call.

## 2019-01-12 NOTE — Telephone Encounter (Signed)
I called patient. Did you receive the clearance from Dr. Lina Sar office? Dr. Lina Sar office said they faxed it. Can you please call the patient to schedule her surgery in August? Thank you.

## 2019-01-14 ENCOUNTER — Other Ambulatory Visit: Payer: Self-pay

## 2019-01-21 ENCOUNTER — Ambulatory Visit (INDEPENDENT_AMBULATORY_CARE_PROVIDER_SITE_OTHER): Payer: Medicare Other | Admitting: Orthopedic Surgery

## 2019-01-21 ENCOUNTER — Other Ambulatory Visit: Payer: Self-pay

## 2019-01-21 ENCOUNTER — Encounter: Payer: Self-pay | Admitting: Orthopedic Surgery

## 2019-01-21 VITALS — BP 139/69 | HR 81 | Temp 98.2°F | Resp 16 | Ht 61.0 in | Wt 153.0 lb

## 2019-01-21 DIAGNOSIS — M1711 Unilateral primary osteoarthritis, right knee: Secondary | ICD-10-CM

## 2019-01-21 NOTE — H&P (Signed)
TOTAL KNEE ADMISSION H&P  Patient is being admitted for right total knee arthroplasty.  Subjective:   Chief Complaint:right knee pain.  HPI: [name], 68 y.o. female, has a history of pain and functional disability in the right knee due to arthritis and has failed non-surgical conservative treatments for greater than 12 weeks to includeNSAID's and/or analgesics, corticosteriod injections, flexibility and strengthening excercises, weight reduction as appropriate and activity modification.  Onset of symptoms was gradual, starting 10 years ago with gradually worsening course since that time. The patient noted no past surgery on the right knee(s).  Patient currently rates pain in the right knee(s) at 8 out of 10 with activity. Patient has worsening of pain with activity and weight bearing, crepitus and joint swelling.  Patient has evidence of subchondral cysts, subchondral sclerosis, periarticular osteophytes, joint subluxation and joint space narrowing by imaging studies. There is no active infection.  Patient Active Problem List   Diagnosis Date Noted  . Rheumatoid arthritis with positive rheumatoid factor  05/14/2016  . High risk medication use 05/14/2016  . Primary osteoarthritis of both hands 05/14/2016  . G6PD deficiency 05/14/2016  . S/p nephrectomy 05/14/2016  . Essential hypertension 05/14/2016  . Depression 05/14/2016  . Sleep apnea 05/14/2016  . Former smoker 05/14/2016  . History of ankle fracture 05/14/2016  . Acute medial meniscus tear of right knee 10/08/2011    Class: Acute  . Osteoarthritis of right knee 10/08/2011    Class: Chronic   Past Medical History:  Diagnosis Date  . Depression   . Hyperlipidemia   . Hypertension   . 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 Rheumatoid arthritis(714.0)   . Sleep apnea    uses cpap-mod-severe    Past Surgical History:  Procedure Laterality Date  . ANKLE ARTHRODESIS  2010   left  . BREAST LUMPECTOMY  1998   rt  . BREAST  SURGERY     rt breast bx  . CERVICAL BIOPSY    . KIDNEY SURGERY    . NEPHRECTOMY  2007   right  . TUBAL LIGATION      No current facility-administered medications for this encounter.    Current Outpatient Medications  Medication Sig Dispense Refill Last Dose  . Adalimumab (HUMIRA PEN) 40 MG/0.8ML PNKT Inject 40 mg into the skin every 14 (fourteen) days. 3 each 0 Taking  . Ascorbic Acid (VITAMIN C PO) Take by mouth.   Not Taking  . Ascorbic Acid (VITAMIN C) 100 MG tablet Take by mouth.   Taking  . diclofenac sodium (VOLTAREN) 1 % GEL Apply 3 grams to 3 large joints, up to 3 times daily as needed. 3 Tube 3 Taking  . FLUoxetine (PROZAC) 10 MG capsule Take 10 mg by mouth daily.   Taking  . fluticasone (FLONASE) 50 MCG/ACT nasal spray Place into both nostrils as needed for allergies or rhinitis.   Taking  . Multiple Vitamins-Minerals (CENTRUM SILVER PO) Take 1 tablet by mouth daily.   Taking  . Omega-3 Fatty Acids (FISH OIL) 1000 MG CAPS Take 1,000 mg by mouth.   Taking  . telmisartan-hydrochlorothiazide (MICARDIS HCT) 80-25 MG tablet    Taking  . VITAMIN D PO Take by mouth.   Taking   Allergies  Allergen Reactions  . Sulfa Antibiotics   . Latex Rash    Social History   Tobacco Use  . Smoking status: Former Smoker    Packs/day: 0.50    Years: 15.00  Pack years: 7.50    Types: Cigarettes    Quit date: 10/04/1995    Years since quitting: 23.3  . Smokeless tobacco: Never Used  Substance Use Topics  . Alcohol use: No    No family history on file.   Review of Systems  Constitutional: Negative for fatigue.  HENT: Negative for trouble swallowing.   Eyes: Negative for pain.  Respiratory: Negative for shortness of breath.   Cardiovascular: Negative for leg swelling.  Gastrointestinal: Negative for constipation.  Endocrine: Negative for cold intolerance.  Genitourinary: Negative for difficulty urinating.  Musculoskeletal: Positive for joint swelling.  Skin: Negative for rash.   Allergic/Immunologic: Negative for food allergies.  Neurological: Negative for weakness.  Hematological: Does not bruise/bleed easily.  Psychiatric/Behavioral: Negative for sleep disturbance.   Objective:  Physical Exam  Constitutional: She is oriented to person, place, and time. She appears well-developed and well-nourished.  HENT:  Head: Normocephalic.  Eyes: Pupils are equal, round, and reactive to light. Conjunctivae and EOM are normal.  Neck: Neck supple. No thyromegaly present.  Cardiovascular: Normal rate, regular rhythm, normal heart sounds and intact distal pulses.  No murmur heard. Respiratory: Effort normal and breath sounds normal. She has no wheezes.  GI: Soft. Bowel sounds are normal. There is no abdominal tenderness.  Neurological: She is alert and oriented to person, place, and time.  Skin: Skin is warm and dry.  Psychiatric: She has a normal mood and affect. Her behavior is normal. Judgment and thought content normal.  Musculoskeletal:  ROM 3-105 degrees. Mild crepitus with ROM.  Mild effusion  Vital signs in last 24 hours: Temp:  [98.2 F (36.8 C)] 98.2 F (36.8 C) (07/23 0817) Pulse Rate:  [81] 81 (07/23 0817) Resp:  [16] 16 (07/23 0817) BP: (139)/(69) 139/69 (07/23 0817) Weight:  [69.4 kg] 69.4 kg (07/23 0817)  Estimated body mass index is 28.91 kg/m as calculated from the following:   Height as of 01/21/19: 5\' 1"  (1.549 m).   Weight as of 01/21/19: 69.4 kg.   Imaging Review Plain radiographs demonstrate moderate degenerative joint disease of the right knee(s). The overall alignment ismild varus. The bone quality appears to be good for age and reported activity level.  Assessment/Plan:  End stage arthritis, right knee   The patient history, physical examination, clinical judgment of the provider and imaging studies are consistent with end stage degenerative joint disease of the right knee(s) and total knee arthroplasty is deemed medically necessary. The  treatment options including medical management, injection therapy arthroscopy and arthroplasty were discussed at length. The risks and benefits of total knee arthroplasty were presented and reviewed. The risks due to aseptic loosening, infection, stiffness, patella tracking problems, thromboembolic complications and other imponderables were discussed. The patient acknowledged the explanation, agreed to proceed with the plan and consent was signed. Patient is being admitted for inpatient treatment for surgery, pain control, PT, OT, prophylactic antibiotics, VTE prophylaxis, progressive ambulation and ADL's and discharge planning. The patient is planning to be discharged home with home health services     Patient's anticipated LOS is less than 2 midnights, meeting these requirements: - Younger than 18 - Lives within 1 hour of care - Has a competent adult at home to recover with post-op recover - NO history of  - Chronic pain requiring opiods  - Diabetes  - Coronary Artery Disease  - Heart failure  - Heart attack  - Stroke  - DVT/VTE  - Cardiac arrhythmia  - Respiratory Failure/COPD  - Renal  failure  - Anemia  - Advanced Liver disease  Oris DroneBrian D. Daphine Deutscheretrarca, PA-C [facility] [phone]  01/21/2019 9:14 AM

## 2019-01-21 NOTE — Progress Notes (Signed)
Office Visit Note   Patient: Diana [name]           Date of Birth: 05-22-1951           MRN: [id number] Visit Date: 01/21/2019              Requested by: Seward Carol, MD [address] [place],   [id number] PCP: Seward Carol, MD   Chief Complaint:right knee pain.  HPI: Diana [name], 68 y.o. female, has a history of pain and functional disability in the right knee due to arthritis and has failed non-surgical conservative treatments for greater than 12 weeks to includeNSAID's and/or analgesics, corticosteriod injections, flexibility and strengthening excercises, weight reduction as appropriate and activity modification.  Onset of symptoms was gradual, starting 10 years ago with gradually worsening course since that time. The patient noted no past surgery on the right knee(s).  Patient currently rates pain in the right knee(s) at 8 out of 10 with activity. Patient has worsening of pain with activity and weight bearing, crepitus and joint swelling.  Patient has evidence of subchondral cysts, subchondral sclerosis, periarticular osteophytes, joint subluxation and joint space narrowing by imaging studies. There is no active infection.  Patient Active Problem List   Diagnosis Date Noted  . Rheumatoid arthritis with positive rheumatoid factor  05/14/2016  . High risk medication use 05/14/2016  . Primary osteoarthritis of both hands 05/14/2016  . G6PD deficiency 05/14/2016  . S/p nephrectomy 05/14/2016  . Essential hypertension 05/14/2016  . Depression 05/14/2016  . Sleep apnea 05/14/2016  . Former smoker 05/14/2016  . History of ankle fracture 05/14/2016  . Acute medial meniscus tear of right knee 10/08/2011    Class: Acute  . Osteoarthritis of right knee 10/08/2011    Class: Chronic   Past Medical History:  Diagnosis Date  . Depression   . Hyperlipidemia   . Hypertension   . 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 Rheumatoid arthritis(714.0)   . Sleep apnea     uses cpap-mod-severe    Past Surgical History:  Procedure Laterality Date  . ANKLE ARTHRODESIS  2010   left  . BREAST LUMPECTOMY  1998   rt  . BREAST SURGERY     rt breast bx  . CERVICAL BIOPSY    . KIDNEY SURGERY    . NEPHRECTOMY  2007   right  . TUBAL LIGATION      No current facility-administered medications for this encounter.    Current Outpatient Medications  Medication Sig Dispense Refill Last Dose  . Adalimumab (HUMIRA PEN) 40 MG/0.8ML PNKT Inject 40 mg into the skin every 14 (fourteen) days. 3 each 0 Taking  . Ascorbic Acid (VITAMIN C PO) Take by mouth.   Not Taking  . Ascorbic Acid (VITAMIN C) 100 MG tablet Take by mouth.   Taking  . diclofenac sodium (VOLTAREN) 1 % GEL Apply 3 grams to 3 large joints, up to 3 times daily as needed. 3 Tube 3 Taking  . FLUoxetine (PROZAC) 10 MG capsule Take 10 mg by mouth daily.   Taking  . fluticasone (FLONASE) 50 MCG/ACT nasal spray Place into both nostrils as needed for allergies or rhinitis.   Taking  . Multiple Vitamins-Minerals (CENTRUM SILVER PO) Take 1 tablet by mouth daily.   Taking  . Omega-3 Fatty Acids (FISH OIL) 1000 MG CAPS Take 1,000 mg by mouth.   Taking  . telmisartan-hydrochlorothiazide (MICARDIS HCT) 80-25 MG tablet  Taking  . VITAMIN D PO Take by mouth.   Taking   Allergies  Allergen Reactions  . Sulfa Antibiotics   . Latex Rash    Social History   Tobacco Use  . Smoking status: Former Smoker    Packs/day: 0.50    Years: 15.00    Pack years: 7.50    Types: Cigarettes    Quit date: 10/04/1995    Years since quitting: 23.3  . Smokeless tobacco: Never Used  Substance Use Topics  . Alcohol use: No    No family history on file.   Review of Systems  Constitutional: Negative for fatigue.  HENT: Negative for trouble swallowing.   Eyes: Negative for pain.  Respiratory: Negative for shortness of breath.   Cardiovascular: Negative for leg swelling.  Gastrointestinal: Negative for constipation.  Endocrine:  Negative for cold intolerance.  Genitourinary: Negative for difficulty urinating.  Musculoskeletal: Positive for joint swelling.  Skin: Negative for rash.  Allergic/Immunologic: Negative for food allergies.  Neurological: Negative for weakness.  Hematological: Does not bruise/bleed easily.  Psychiatric/Behavioral: Negative for sleep disturbance.   Objective:  Physical Exam  Constitutional: She is oriented to person, place, and time. She appears well-developed and well-nourished.  HENT:  Head: Normocephalic.  Eyes: Pupils are equal, round, and reactive to light. Conjunctivae and EOM are normal.  Neck: Neck supple. No thyromegaly present.  Cardiovascular: Normal rate, regular rhythm, normal heart sounds and intact distal pulses.  No murmur heard. Respiratory: Effort normal and breath sounds normal. She has no wheezes.  GI: Soft. Bowel sounds are normal. There is no abdominal tenderness.  Neurological: She is alert and oriented to person, place, and time.  Skin: Skin is warm and dry.  Psychiatric: She has a normal mood and affect. Her behavior is normal. Judgment and thought content normal.  Musculoskeletal:  ROM 3-105 degrees. Mild crepitus with ROM.  Mild effusion  Vital signs in last 24 hours: Temp:  [98.2 F (36.8 C)] 98.2 F (36.8 C) (07/23 0817) Pulse Rate:  [81] 81 (07/23 0817) Resp:  [16] 16 (07/23 0817) BP: (139)/(69) 139/69 (07/23 0817) Weight:  [69.4 kg] 69.4 kg (07/23 0817)  Estimated body mass index is 28.91 kg/m as calculated from the following:   Height as of 01/21/19: 5\' 1"  (1.549 m).   Weight as of 01/21/19: 69.4 kg.   Imaging Review Plain radiographs demonstrate moderate degenerative joint disease of the right knee(s). The overall alignment ismild varus. The bone quality appears to be good for age and reported activity level.  Assessment/Plan:  End stage arthritis, right knee   The patient history, physical examination, clinical judgment of the provider  and imaging studies are consistent with end stage degenerative joint disease of the right knee(s) and total knee arthroplasty is deemed medically necessary. The treatment options including medical management, injection therapy arthroscopy and arthroplasty were discussed at length. The risks and benefits of total knee arthroplasty were presented and reviewed. The risks due to aseptic loosening, infection, stiffness, patella tracking problems, thromboembolic complications and other imponderables were discussed. The patient acknowledged the explanation, agreed to proceed with the plan and consent was signed. Patient is being admitted for inpatient treatment for surgery, pain control, PT, OT, prophylactic antibiotics, VTE prophylaxis, progressive ambulation and ADL's and discharge planning. The patient is planning to be discharged home with home health services     Patient's anticipated LOS is less than 2 midnights, meeting these requirements: - Younger than 76 - Lives within 1 hour of care -  Has a competent adult at home to recover with post-op recover - NO history of  - Chronic pain requiring opiods  - Diabetes  - Coronary Artery Disease  - Heart failure  - Heart attack  - Stroke  - DVT/VTE  - Cardiac arrhythmia  - Respiratory Failure/COPD  - Renal failure  - Anemia  - Advanced Liver disease  Oris Drone. Daphine Deutscher [phone]  01/21/2019 9:14 AM

## 2019-01-28 NOTE — Patient Instructions (Addendum)
YOU HAVE COMPLETED YOUR COVID-19 TEST. PLEASE CONTINUE THE QUARANTINE INSTRUCTIONS AS OUTLINED IN YOUR HANDOUT.                Diana [name]     Your procedure is scheduled on: 02-02-19    [facility]    Report to Admitting at 7:25 AM    1 VISITOR IS ALLOWED TO WAIT IN WAITING ROOM  ONLY DAY OF YOUR SURGERY.     Call this number if you have problems the morning of surgery [phone]     Remember: After Midnight. NOTHING BY MOUTH EXCEPT CLEAR LIQUIDS . PLEASE FINISH ENSURE DRINK AT 6:55 AM____________.     CLEAR LIQUID DIET   Foods Allowed                                                                     Foods Excluded  Coffee and tea, regular and decaf                             liquids that you cannot  Plain Jell-O any favor except red or purple                                           see through such as: Fruit ices (not with fruit pulp)                                     milk, soups, orange juice  Iced Popsicles                                    All solid food Carbonated beverages, regular and diet                                    Cranberry, grape and apple juices Sports drinks like Gatorade Lightly seasoned clear broth or consume(fat free) Sugar, honey syrup  Sample Menu Breakfast                                Lunch                                     Supper Cranberry juice                    Beef broth                            Chicken broth Jell-O  Grape juice                           Apple juice Coffee or tea                        Jell-O                                      Popsicle                                                Coffee or tea                        Coffee or tea  _____________________________________________________________________     Take these medicines the morning of surgery with A SIP OF WATER: Fluoxetine (Prozac)  BRUSH YOUR TEETH MORNING OF SURGERY AND  RINSE YOUR MOUTH OUT, NO CHEWING GUM CANDY OR MINTS.   DO NOT TAKE ANY DIABETIC MEDICATIONS DAY OF YOUR SURGERY                               You may not have any metal on your body including hair pins and              piercings  Do not wear jewelry, make-up, lotions, powders or perfumes, deodorant             Do not wear nail polish.  Do not shave  48 hours prior to surgery.              Men may shave face and neck.   Do not bring valuables to the hospital. [place].  Contacts, dentures or bridgework may not be worn into surgery.  Leave suitcase in the car. After surgery it may be brought to your room.     Patients discharged the day of surgery will not be allowed to drive home. IF YOU ARE HAVING SURGERY AND GOING HOME THE SAME DAY, YOU MUST HAVE AN ADULT TO DRIVE YOU HOME AND BE WITH YOU FOR 24 HOURS. YOU MAY GO HOME BY TAXI OR UBER OR ORTHERWISE, BUT AN ADULT MUST ACCOMPANY YOU HOME AND STAY WITH YOU FOR 24 HOURS.  Name and phone number of your driver:  Special Instructions: N/A              Please read over the following fact sheets you were given: _____________________________________________________________________             [facility] - Preparing for Surgery Before surgery, you can play an important role.  Because skin is not sterile, your skin needs to be as free of germs as possible.  You can reduce the number of germs on your skin by washing with CHG (chlorahexidine gluconate) soap before surgery.  CHG is an antiseptic cleaner which kills germs and bonds with the skin to continue killing germs even after washing. Please DO NOT use if you have an allergy to CHG or antibacterial soaps.  If your skin becomes reddened/irritated  stop using the CHG and inform your nurse when you arrive at Short Stay. Do not shave (including legs and underarms) for at least 48 hours prior to the first CHG shower.  You may shave your  face/neck. Please follow these instructions carefully:  1.  Shower with CHG Soap the night before surgery and the  morning of Surgery.  2.  If you choose to wash your hair, wash your hair first as usual with your  normal  shampoo.  3.  After you shampoo, rinse your hair and body thoroughly to remove the  shampoo.                           4.  Use CHG as you would any other liquid soap.  You can apply chg directly  to the skin and wash                       Gently with a scrungie or clean washcloth.  5.  Apply the CHG Soap to your body ONLY FROM THE NECK DOWN.   Do not use on face/ open                           Wound or open sores. Avoid contact with eyes, ears mouth and genitals (private parts).                       Wash face,  Genitals (private parts) with your normal soap.             6.  Wash thoroughly, paying special attention to the area where your surgery  will be performed.  7.  Thoroughly rinse your body with warm water from the neck down.  8.  DO NOT shower/wash with your normal soap after using and rinsing off  the CHG Soap.                9.  Pat yourself dry with a clean towel.            10.  Wear clean pajamas.            11.  Place clean sheets on your bed the night of your first shower and do not  sleep with pets. Day of Surgery : Do not apply any lotions/deodorants the morning of surgery.  Please wear clean clothes to the hospital/surgery center.  FAILURE TO FOLLOW THESE INSTRUCTIONS MAY RESULT IN THE CANCELLATION OF YOUR SURGERY PATIENT SIGNATURE_________________________________  NURSE SIGNATURE__________________________________  ________________________________________________________________________   Rogelia MireIncentive Spirometer  An incentive spirometer is a tool that can help keep your lungs clear and active. This tool measures how well you are filling your lungs with each breath. Taking long deep breaths may help reverse or decrease the chance of developing breathing  (pulmonary) problems (especially infection) following:  A long period of time when you are unable to move or be active. BEFORE THE PROCEDURE   If the spirometer includes an indicator to show your best effort, your nurse or respiratory therapist will set it to a desired goal.  If possible, sit up straight or lean slightly forward. Try not to slouch.  Hold the incentive spirometer in an upright position. INSTRUCTIONS FOR USE  1. Sit on the edge of your bed if possible, or sit up as far as you can in bed or on a chair. 2. Hold the  incentive spirometer in an upright position. 3. Breathe out normally. 4. Place the mouthpiece in your mouth and seal your lips tightly around it. 5. Breathe in slowly and as deeply as possible, raising the piston or the ball toward the top of the column. 6. Hold your breath for 3-5 seconds or for as long as possible. Allow the piston or ball to fall to the bottom of the column. 7. Remove the mouthpiece from your mouth and breathe out normally. 8. Rest for a few seconds and repeat Steps 1 through 7 at least 10 times every 1-2 hours when you are awake. Take your time and take a few normal breaths between deep breaths. 9. The spirometer may include an indicator to show your best effort. Use the indicator as a goal to work toward during each repetition. 10. After each set of 10 deep breaths, practice coughing to be sure your lungs are clear. If you have an incision (the cut made at the time of surgery), support your incision when coughing by placing a pillow or rolled up towels firmly against it. Once you are able to get out of bed, walk around indoors and cough well. You may stop using the incentive spirometer when instructed by your caregiver.  RISKS AND COMPLICATIONS  Take your time so you do not get dizzy or light-headed.  If you are in pain, you may need to take or ask for pain medication before doing incentive spirometry. It is harder to take a deep breath if you  are having pain. AFTER USE  Rest and breathe slowly and easily.  It can be helpful to keep track of a log of your progress. Your caregiver can provide you with a simple table to help with this. If you are using the spirometer at home, follow these instructions: SEEK MEDICAL CARE IF:   You are having difficultly using the spirometer.  You have trouble using the spirometer as often as instructed.  Your pain medication is not giving enough relief while using the spirometer.  You develop fever of 100.5 F (38.1 C) or higher. SEEK IMMEDIATE MEDICAL CARE IF:   You cough up bloody sputum that had not been present before.  You develop fever of 102 F (38.9 C) or greater.  You develop worsening pain at or near the incision site. MAKE SURE YOU:   Understand these instructions.  Will watch your condition.  Will get help right away if you are not doing well or get worse. Document Released: 10/28/2006 Document Revised: 09/09/2011 Document Reviewed: 12/29/2006 Exit[employer] Patient Information 2014 ExitCare, [place].   ________________________________________________________________________  WHAT IS A BLOOD TRANSFUSION? Blood Transfusion Information  A transfusion is the replacement of blood or some of its parts. Blood is made up of multiple cells which provide different functions.  Red blood cells carry oxygen and are used for blood loss replacement.  White blood cells fight against infection.  Platelets control bleeding.  Plasma helps clot blood.  Other blood products are available for specialized needs, such as hemophilia or other clotting disorders. BEFORE THE TRANSFUSION  Who gives blood for transfusions?   Healthy volunteers who are fully evaluated to make sure their blood is safe. This is blood bank blood. Transfusion therapy is the safest it has ever been in the practice of medicine. Before blood is taken from a donor, a complete history is taken to make sure that person has  no history of diseases nor engages in risky social behavior (examples are intravenous drug use  or sexual activity with multiple partners). The donor's travel history is screened to minimize risk of transmitting infections, such as malaria. The donated blood is tested for signs of infectious diseases, such as HIV and hepatitis. The blood is then tested to be sure it is compatible with you in order to minimize the chance of a transfusion reaction. If you or a relative donates blood, this is often done in anticipation of surgery and is not appropriate for emergency situations. It takes many days to process the donated blood. RISKS AND COMPLICATIONS Although transfusion therapy is very safe and saves many lives, the main dangers of transfusion include:   Getting an infectious disease.  Developing a transfusion reaction. This is an allergic reaction to something in the blood you were given. Every precaution is taken to prevent this. The decision to have a blood transfusion has been considered carefully by your caregiver before blood is given. Blood is not given unless the benefits outweigh the risks. AFTER THE TRANSFUSION  Right after receiving a blood transfusion, you will usually feel much better and more energetic. This is especially true if your red blood cells have gotten low (anemic). The transfusion raises the level of the red blood cells which carry oxygen, and this usually causes an energy increase.  The nurse administering the transfusion will monitor you carefully for complications. HOME CARE INSTRUCTIONS  No special instructions are needed after a transfusion. You may find your energy is better. Speak with your caregiver about any limitations on activity for underlying diseases you may have. SEEK MEDICAL CARE IF:   Your condition is not improving after your transfusion.  You develop redness or irritation at the intravenous (IV) site. SEEK IMMEDIATE MEDICAL CARE IF:  Any of the following  symptoms occur over the next 12 hours:  Shaking chills.  You have a temperature by mouth above 102 F (38.9 C), not controlled by medicine.  Chest, back, or muscle pain.  People around you feel you are not acting correctly or are confused.  Shortness of breath or difficulty breathing.  Dizziness and fainting.  You get a rash or develop hives.  You have a decrease in urine output.  Your urine turns a dark color or changes to pink, red, or brown. Any of the following symptoms occur over the next 10 days:  You have a temperature by mouth above 102 F (38.9 C), not controlled by medicine.  Shortness of breath.  Weakness after normal activity.  The white part of the eye turns yellow (jaundice).  You have a decrease in the amount of urine or are urinating less often.  Your urine turns a dark color or changes to pink, red, or brown. Document Released: 06/14/2000 Document Revised: 09/09/2011 Document Reviewed: 02/01/2008 [facility] Patient Information 2014 [place], [place].  _______________________________________________________________________

## 2019-01-29 ENCOUNTER — Other Ambulatory Visit (HOSPITAL_COMMUNITY)
Admission: RE | Admit: 2019-01-29 | Discharge: 2019-01-29 | Disposition: A | Discharge status: Home / Self Care | Payer: Medicare Other | Source: Ambulatory Visit | Attending: Orthopaedic Surgery | Admitting: Orthopaedic Surgery

## 2019-01-29 DIAGNOSIS — Z01812 Encounter for preprocedural laboratory examination: Secondary | ICD-10-CM | POA: Diagnosis present

## 2019-01-29 DIAGNOSIS — Z20828 Contact with and (suspected) exposure to other viral communicable diseases: Secondary | ICD-10-CM | POA: Diagnosis not present

## 2019-01-29 LAB — SARS CORONAVIRUS 2 (TAT 6-24 HRS): SARS Coronavirus 2: NEGATIVE

## 2019-02-01 ENCOUNTER — Other Ambulatory Visit: Payer: Self-pay

## 2019-02-01 ENCOUNTER — Ambulatory Visit (HOSPITAL_COMMUNITY)
Admission: RE | Admit: 2019-02-01 | Discharge: 2019-02-01 | Disposition: A | Discharge status: Home / Self Care | Payer: Medicare Other | Source: Ambulatory Visit | Attending: Orthopedic Surgery | Admitting: Orthopedic Surgery

## 2019-02-01 ENCOUNTER — Encounter (HOSPITAL_COMMUNITY): Payer: Self-pay

## 2019-02-01 ENCOUNTER — Encounter (HOSPITAL_COMMUNITY)
Admission: RE | Admit: 2019-02-01 | Discharge: 2019-02-01 | Disposition: A | Discharge status: Home / Self Care | Payer: Medicare Other | Source: Ambulatory Visit | Attending: Orthopaedic Surgery | Admitting: Orthopaedic Surgery

## 2019-02-01 DIAGNOSIS — Z87891 Personal history of nicotine dependence: Secondary | ICD-10-CM | POA: Diagnosis not present

## 2019-02-01 DIAGNOSIS — M069 Rheumatoid arthritis, unspecified: Secondary | ICD-10-CM | POA: Insufficient documentation

## 2019-02-01 DIAGNOSIS — I10 Essential (primary) hypertension: Secondary | ICD-10-CM | POA: Diagnosis not present

## 2019-02-01 DIAGNOSIS — F329 Major depressive disorder, single episode, unspecified: Secondary | ICD-10-CM | POA: Diagnosis not present

## 2019-02-01 DIAGNOSIS — G4730 Sleep apnea, unspecified: Secondary | ICD-10-CM | POA: Diagnosis not present

## 2019-02-01 DIAGNOSIS — M1711 Unilateral primary osteoarthritis, right knee: Secondary | ICD-10-CM | POA: Insufficient documentation

## 2019-02-01 DIAGNOSIS — Z79899 Other long term (current) drug therapy: Secondary | ICD-10-CM | POA: Insufficient documentation

## 2019-02-01 DIAGNOSIS — Z01818 Encounter for other preprocedural examination: Secondary | ICD-10-CM | POA: Diagnosis not present

## 2019-02-01 HISTORY — DX: Disorder of kidney and ureter, unspecified: N28.9

## 2019-02-01 LAB — CBC WITH DIFFERENTIAL/PLATELET
Abs Immature Granulocytes: 0.02 10*3/uL (ref 0.00–0.07)
Basophils Absolute: 0.1 10*3/uL (ref 0.0–0.1)
Basophils Relative: 1 %
Eosinophils Absolute: 0.8 10*3/uL — ABNORMAL HIGH (ref 0.0–0.5)
Eosinophils Relative: 12 %
HCT: 43.4 % (ref 36.0–46.0)
Hemoglobin: 13.8 g/dL (ref 12.0–15.0)
Immature Granulocytes: 0 %
Lymphocytes Relative: 25 %
Lymphs Abs: 1.7 10*3/uL (ref 0.7–4.0)
MCH: 29.2 pg (ref 26.0–34.0)
MCHC: 31.8 g/dL (ref 30.0–36.0)
MCV: 91.9 fL (ref 80.0–100.0)
Monocytes Absolute: 0.6 10*3/uL (ref 0.1–1.0)
Monocytes Relative: 8 %
Neutro Abs: 3.7 10*3/uL (ref 1.7–7.7)
Neutrophils Relative %: 54 %
Platelets: 305 10*3/uL (ref 150–400)
RBC: 4.72 MIL/uL (ref 3.87–5.11)
RDW: 12.3 % (ref 11.5–15.5)
WBC: 6.8 10*3/uL (ref 4.0–10.5)
nRBC: 0 % (ref 0.0–0.2)

## 2019-02-01 LAB — COMPREHENSIVE METABOLIC PANEL
ALT: 18 U/L (ref 0–44)
AST: 23 U/L (ref 15–41)
Albumin: 4 g/dL (ref 3.5–5.0)
Alkaline Phosphatase: 80 U/L (ref 38–126)
Anion gap: 8 (ref 5–15)
BUN: 22 mg/dL (ref 8–23)
CO2: 32 mmol/L (ref 22–32)
Calcium: 9.9 mg/dL (ref 8.9–10.3)
Chloride: 104 mmol/L (ref 98–111)
Creatinine, Ser: 0.89 mg/dL (ref 0.44–1.00)
GFR calc Af Amer: >60 mL/min (ref >60)
GFR calc non Af Amer: >60 mL/min (ref >60)
Glucose, Bld: 92 mg/dL (ref 70–99)
Potassium: 4.2 mmol/L (ref 3.5–5.1)
Sodium: 144 mmol/L (ref 135–145)
Total Bilirubin: 0.8 mg/dL (ref 0.3–1.2)
Total Protein: 8.1 g/dL (ref 6.5–8.1)

## 2019-02-01 LAB — APTT: aPTT: 29 seconds (ref 24–36)

## 2019-02-01 LAB — URINALYSIS, ROUTINE W REFLEX MICROSCOPIC
Bacteria, UA: NONE SEEN
Bilirubin Urine: NEGATIVE
Glucose, UA: NEGATIVE mg/dL
Hgb urine dipstick: NEGATIVE
Ketones, ur: NEGATIVE mg/dL
Leukocytes,Ua: NEGATIVE
Nitrite: NEGATIVE
Protein, ur: 30 mg/dL — AB
Specific Gravity, Urine: 1.021 (ref 1.005–1.030)
pH: 8 (ref 5.0–8.0)

## 2019-02-01 LAB — SURGICAL PCR SCREEN
MRSA, PCR: NEGATIVE
Staphylococcus aureus: POSITIVE — AB

## 2019-02-01 LAB — PROTIME-INR
INR: 0.9 (ref 0.8–1.2)
Prothrombin Time: 12.1 seconds (ref 11.4–15.2)

## 2019-02-01 LAB — NO BLOOD PRODUCTS

## 2019-02-01 MED ORDER — TRANEXAMIC ACID 1000 MG/10ML IV SOLN
2000.0000 mg | INTRAVENOUS | Status: DC
  Filled 2019-02-01: qty 20

## 2019-02-01 NOTE — Progress Notes (Signed)
BLOOD PRODUCT REFUSAL FORM FAXED TO BLOOD BANK AND SURGEON (DR.PETER WHITFIELD) OFFICE. FAX RECEIPT PLACED ON PATIENT CHART.

## 2019-02-02 ENCOUNTER — Encounter (HOSPITAL_COMMUNITY)
Admission: RE | Disposition: A | Discharge status: Home / Self Care | Payer: Self-pay | Source: Ambulatory Visit | Attending: Orthopaedic Surgery

## 2019-02-02 ENCOUNTER — Ambulatory Visit (HOSPITAL_COMMUNITY): Payer: Medicare Other | Admitting: Registered Nurse

## 2019-02-02 ENCOUNTER — Encounter (HOSPITAL_COMMUNITY): Payer: Self-pay | Admitting: Emergency Medicine

## 2019-02-02 ENCOUNTER — Other Ambulatory Visit: Payer: Self-pay

## 2019-02-02 ENCOUNTER — Ambulatory Visit (HOSPITAL_COMMUNITY): Payer: Medicare Other | Admitting: Physician Assistant

## 2019-02-02 ENCOUNTER — Observation Stay (HOSPITAL_COMMUNITY)
Admission: RE | Admit: 2019-02-02 | Discharge: 2019-02-03 | Disposition: A | Discharge status: Home / Self Care | Payer: Medicare Other | Source: Ambulatory Visit | Attending: Orthopaedic Surgery | Admitting: Orthopaedic Surgery

## 2019-02-02 DIAGNOSIS — Z87891 Personal history of nicotine dependence: Secondary | ICD-10-CM | POA: Diagnosis not present

## 2019-02-02 DIAGNOSIS — M1711 Unilateral primary osteoarthritis, right knee: Principal | ICD-10-CM | POA: Insufficient documentation

## 2019-02-02 DIAGNOSIS — M25761 Osteophyte, right knee: Secondary | ICD-10-CM | POA: Diagnosis not present

## 2019-02-02 DIAGNOSIS — Z79899 Other long term (current) drug therapy: Secondary | ICD-10-CM | POA: Insufficient documentation

## 2019-02-02 DIAGNOSIS — E785 Hyperlipidemia, unspecified: Secondary | ICD-10-CM | POA: Insufficient documentation

## 2019-02-02 DIAGNOSIS — I10 Essential (primary) hypertension: Secondary | ICD-10-CM | POA: Diagnosis not present

## 2019-02-02 DIAGNOSIS — Z96651 Presence of right artificial knee joint: Secondary | ICD-10-CM

## 2019-02-02 DIAGNOSIS — Z905 Acquired absence of kidney: Secondary | ICD-10-CM | POA: Insufficient documentation

## 2019-02-02 DIAGNOSIS — F329 Major depressive disorder, single episode, unspecified: Secondary | ICD-10-CM | POA: Insufficient documentation

## 2019-02-02 DIAGNOSIS — G4730 Sleep apnea, unspecified: Secondary | ICD-10-CM | POA: Diagnosis not present

## 2019-02-02 DIAGNOSIS — M25561 Pain in right knee: Secondary | ICD-10-CM | POA: Diagnosis present

## 2019-02-02 HISTORY — PX: TOTAL KNEE ARTHROPLASTY: SHX125

## 2019-02-02 LAB — URINE CULTURE: Culture: NO GROWTH

## 2019-02-02 SURGERY — ARTHROPLASTY, KNEE, TOTAL
Anesthesia: Spinal | Laterality: Right

## 2019-02-02 MED ORDER — CEFAZOLIN SODIUM-DEXTROSE 2-4 GM/100ML-% IV SOLN
2.0000 g | Freq: Four times a day (QID) | INTRAVENOUS | Status: AC
  Administered 2019-02-02 (×2): 2 g via INTRAVENOUS
  Filled 2019-02-02 (×2): qty 100

## 2019-02-02 MED ORDER — SODIUM CHLORIDE 0.9 % IV SOLN
75.0000 mL/h | INTRAVENOUS | Status: DC
  Administered 2019-02-02 – 2019-02-03 (×2): 75 mL/h via INTRAVENOUS

## 2019-02-02 MED ORDER — ASPIRIN 81 MG PO CHEW
81.0000 mg | CHEWABLE_TABLET | Freq: Two times a day (BID) | ORAL | Status: DC
  Administered 2019-02-02 – 2019-02-03 (×2): 81 mg via ORAL
  Filled 2019-02-02 (×2): qty 1

## 2019-02-02 MED ORDER — LIDOCAINE 2% (20 MG/ML) 5 ML SYRINGE
INTRAMUSCULAR | Status: AC
  Filled 2019-02-02: qty 5

## 2019-02-02 MED ORDER — FENTANYL CITRATE (PF) 250 MCG/5ML IJ SOLN
INTRAMUSCULAR | Status: AC
  Filled 2019-02-02: qty 5

## 2019-02-02 MED ORDER — MUPIROCIN 2 % EX OINT
TOPICAL_OINTMENT | Freq: Two times a day (BID) | CUTANEOUS | Status: DC
  Filled 2019-02-02: qty 22

## 2019-02-02 MED ORDER — ONDANSETRON HCL 4 MG/2ML IJ SOLN: 4.0000 mg | Freq: Four times a day (QID) | INTRAMUSCULAR | Status: DC | PRN

## 2019-02-02 MED ORDER — ALUM & MAG HYDROXIDE-SIMETH 200-200-20 MG/5ML PO SUSP: 30.0000 mL | ORAL | Status: DC | PRN

## 2019-02-02 MED ORDER — ONDANSETRON HCL 4 MG/2ML IJ SOLN
INTRAMUSCULAR | Status: DC | PRN
  Administered 2019-02-02: 4 mg via INTRAVENOUS

## 2019-02-02 MED ORDER — LACTATED RINGERS IV SOLN
INTRAVENOUS | Status: DC
  Administered 2019-02-02 (×3): via INTRAVENOUS

## 2019-02-02 MED ORDER — MIDAZOLAM HCL 2 MG/2ML IJ SOLN
1.0000 mg | INTRAMUSCULAR | Status: DC
  Administered 2019-02-02: 2 mg via INTRAVENOUS
  Filled 2019-02-02: qty 2

## 2019-02-02 MED ORDER — DOCUSATE SODIUM 100 MG PO CAPS
100.0000 mg | ORAL_CAPSULE | Freq: Two times a day (BID) | ORAL | Status: DC
  Administered 2019-02-03: 100 mg via ORAL
  Filled 2019-02-02 (×2): qty 1

## 2019-02-02 MED ORDER — SODIUM CHLORIDE 0.9 % IV SOLN
INTRAVENOUS | Status: DC | PRN
  Administered 2019-02-02: 25 ug/min via INTRAVENOUS

## 2019-02-02 MED ORDER — SODIUM CHLORIDE 0.9 % IV SOLN: INTRAVENOUS | Status: DC

## 2019-02-02 MED ORDER — PHENYLEPHRINE HCL (PRESSORS) 10 MG/ML IV SOLN
INTRAVENOUS | Status: AC
  Filled 2019-02-02: qty 1

## 2019-02-02 MED ORDER — DEXAMETHASONE SODIUM PHOSPHATE 10 MG/ML IJ SOLN
INTRAMUSCULAR | Status: DC | PRN
  Administered 2019-02-02: 10 mg via INTRAVENOUS

## 2019-02-02 MED ORDER — MUPIROCIN 2 % EX OINT
TOPICAL_OINTMENT | Freq: Two times a day (BID) | CUTANEOUS | Status: DC
  Administered 2019-02-02: 1 via NASAL
  Administered 2019-02-02 – 2019-02-03 (×2): via NASAL
  Filled 2019-02-02: qty 22

## 2019-02-02 MED ORDER — POVIDONE-IODINE 10 % EX SWAB: 2.0000 "application " | Freq: Once | CUTANEOUS | Status: DC

## 2019-02-02 MED ORDER — EPHEDRINE 5 MG/ML INJ
INTRAVENOUS | Status: AC
  Filled 2019-02-02: qty 10

## 2019-02-02 MED ORDER — PROMETHAZINE HCL 25 MG/ML IJ SOLN: 6.2500 mg | INTRAMUSCULAR | Status: DC | PRN

## 2019-02-02 MED ORDER — ACETAMINOPHEN 325 MG PO TABS: 325.0000 mg | ORAL_TABLET | Freq: Four times a day (QID) | ORAL | Status: DC | PRN

## 2019-02-02 MED ORDER — FENTANYL CITRATE (PF) 100 MCG/2ML IJ SOLN
INTRAMUSCULAR | Status: DC | PRN
  Administered 2019-02-02 (×3): 50 ug via INTRAVENOUS
  Administered 2019-02-02: 25 ug via INTRAVENOUS
  Administered 2019-02-02 (×2): 50 ug via INTRAVENOUS
  Administered 2019-02-02: 25 ug via INTRAVENOUS

## 2019-02-02 MED ORDER — EPHEDRINE SULFATE-NACL 50-0.9 MG/10ML-% IV SOSY
PREFILLED_SYRINGE | INTRAVENOUS | Status: DC | PRN
  Administered 2019-02-02: 10 mg via INTRAVENOUS

## 2019-02-02 MED ORDER — DIPHENHYDRAMINE HCL 12.5 MG/5ML PO ELIX: 12.5000 mg | ORAL_SOLUTION | ORAL | Status: DC | PRN

## 2019-02-02 MED ORDER — BISACODYL 10 MG RE SUPP: 10.0000 mg | Freq: Every day | RECTAL | Status: DC | PRN

## 2019-02-02 MED ORDER — MAGNESIUM HYDROXIDE 400 MG/5ML PO SUSP: 30.0000 mL | Freq: Every day | ORAL | Status: DC | PRN

## 2019-02-02 MED ORDER — FLUTICASONE PROPIONATE 50 MCG/ACT NA SUSP: 2.0000 | Freq: Every day | NASAL | Status: DC | PRN

## 2019-02-02 MED ORDER — CHLORHEXIDINE GLUCONATE 4 % EX LIQD: 60.0000 mL | Freq: Once | CUTANEOUS | Status: DC

## 2019-02-02 MED ORDER — MIDAZOLAM HCL 2 MG/2ML IJ SOLN
INTRAMUSCULAR | Status: AC
  Filled 2019-02-02: qty 2

## 2019-02-02 MED ORDER — HYDROCODONE-ACETAMINOPHEN 5-325 MG PO TABS
1.0000 | ORAL_TABLET | ORAL | Status: DC | PRN
  Administered 2019-02-02 – 2019-02-03 (×2): 1 via ORAL
  Filled 2019-02-02 (×2): qty 1

## 2019-02-02 MED ORDER — IRBESARTAN 150 MG PO TABS
300.0000 mg | ORAL_TABLET | Freq: Every day | ORAL | Status: DC
  Administered 2019-02-03: 300 mg via ORAL
  Filled 2019-02-02: qty 2

## 2019-02-02 MED ORDER — PHENOL 1.4 % MT LIQD: 1.0000 | OROMUCOSAL | Status: DC | PRN

## 2019-02-02 MED ORDER — VITAMIN C 500 MG PO TABS
500.0000 mg | ORAL_TABLET | Freq: Every day | ORAL | Status: DC
  Administered 2019-02-03: 500 mg via ORAL
  Filled 2019-02-02: qty 1

## 2019-02-02 MED ORDER — KETOROLAC TROMETHAMINE 30 MG/ML IJ SOLN: 30.0000 mg | Freq: Once | INTRAMUSCULAR | Status: DC | PRN

## 2019-02-02 MED ORDER — PROPOFOL 10 MG/ML IV BOLUS
INTRAVENOUS | Status: DC | PRN
  Administered 2019-02-02: 150 mg via INTRAVENOUS

## 2019-02-02 MED ORDER — CEFAZOLIN SODIUM-DEXTROSE 2-4 GM/100ML-% IV SOLN
2.0000 g | INTRAVENOUS | Status: AC
  Administered 2019-02-02: 2 g via INTRAVENOUS
  Filled 2019-02-02: qty 100

## 2019-02-02 MED ORDER — MEPERIDINE HCL 50 MG/ML IJ SOLN: 6.2500 mg | INTRAMUSCULAR | Status: DC | PRN

## 2019-02-02 MED ORDER — ACETAMINOPHEN 10 MG/ML IV SOLN
1000.0000 mg | Freq: Once | INTRAVENOUS | Status: AC
  Administered 2019-02-02: 11:00:00 1000 mg via INTRAVENOUS
  Filled 2019-02-02: qty 100

## 2019-02-02 MED ORDER — KETOROLAC TROMETHAMINE 15 MG/ML IJ SOLN
7.5000 mg | Freq: Four times a day (QID) | INTRAMUSCULAR | Status: DC
  Administered 2019-02-02 – 2019-02-03 (×3): 7.5 mg via INTRAVENOUS
  Filled 2019-02-02 (×4): qty 1

## 2019-02-02 MED ORDER — HYDROMORPHONE HCL 1 MG/ML IJ SOLN: 0.2500 mg | INTRAMUSCULAR | Status: DC | PRN

## 2019-02-02 MED ORDER — METHOCARBAMOL 500 MG PO TABS
500.0000 mg | ORAL_TABLET | Freq: Four times a day (QID) | ORAL | Status: DC | PRN
  Administered 2019-02-02: 500 mg via ORAL
  Filled 2019-02-02: qty 1

## 2019-02-02 MED ORDER — ADULT MULTIVITAMIN W/MINERALS CH
1.0000 | ORAL_TABLET | Freq: Every day | ORAL | Status: DC
  Administered 2019-02-03: 09:00:00 1 via ORAL
  Filled 2019-02-02: qty 1

## 2019-02-02 MED ORDER — METOCLOPRAMIDE HCL 5 MG PO TABS: 5.0000 mg | ORAL_TABLET | Freq: Three times a day (TID) | ORAL | Status: DC | PRN

## 2019-02-02 MED ORDER — ACETAMINOPHEN 10 MG/ML IV SOLN
1000.0000 mg | Freq: Four times a day (QID) | INTRAVENOUS | Status: DC
  Administered 2019-02-02 – 2019-02-03 (×3): 1000 mg via INTRAVENOUS
  Filled 2019-02-02 (×4): qty 100

## 2019-02-02 MED ORDER — FENTANYL CITRATE (PF) 100 MCG/2ML IJ SOLN
INTRAMUSCULAR | Status: AC
  Filled 2019-02-02: qty 2

## 2019-02-02 MED ORDER — FENTANYL CITRATE (PF) 100 MCG/2ML IJ SOLN
50.0000 ug | INTRAMUSCULAR | Status: DC
  Administered 2019-02-02: 100 ug via INTRAVENOUS
  Filled 2019-02-02: qty 2

## 2019-02-02 MED ORDER — ONDANSETRON HCL 4 MG/2ML IJ SOLN
INTRAMUSCULAR | Status: AC
  Filled 2019-02-02: qty 2

## 2019-02-02 MED ORDER — PROPOFOL 10 MG/ML IV BOLUS
INTRAVENOUS | Status: AC
  Filled 2019-02-02: qty 60

## 2019-02-02 MED ORDER — TRANEXAMIC ACID 1000 MG/10ML IV SOLN
INTRAVENOUS | Status: DC | PRN
  Administered 2019-02-02: 12:00:00 2000 mg via TOPICAL

## 2019-02-02 MED ORDER — FLUOXETINE HCL 10 MG PO CAPS
10.0000 mg | ORAL_CAPSULE | Freq: Every day | ORAL | Status: DC
  Administered 2019-02-03: 10 mg via ORAL
  Filled 2019-02-02: qty 1

## 2019-02-02 MED ORDER — BUPIVACAINE HCL 0.5 % IJ SOLN
INTRAMUSCULAR | Status: DC | PRN
  Administered 2019-02-02: 30 mL

## 2019-02-02 MED ORDER — MAGNESIUM CITRATE PO SOLN: 1.0000 | Freq: Once | ORAL | Status: DC | PRN

## 2019-02-02 MED ORDER — METOCLOPRAMIDE HCL 5 MG/ML IJ SOLN: 5.0000 mg | Freq: Three times a day (TID) | INTRAMUSCULAR | Status: DC | PRN

## 2019-02-02 MED ORDER — MENTHOL 3 MG MT LOZG: 1.0000 | LOZENGE | OROMUCOSAL | Status: DC | PRN

## 2019-02-02 MED ORDER — MORPHINE SULFATE (PF) 2 MG/ML IV SOLN: 0.5000 mg | INTRAVENOUS | Status: DC | PRN

## 2019-02-02 MED ORDER — TRANEXAMIC ACID-NACL 1000-0.7 MG/100ML-% IV SOLN
INTRAVENOUS | Status: AC
  Filled 2019-02-02: qty 100

## 2019-02-02 MED ORDER — DEXAMETHASONE SODIUM PHOSPHATE 10 MG/ML IJ SOLN
INTRAMUSCULAR | Status: AC
  Filled 2019-02-02: qty 1

## 2019-02-02 MED ORDER — HYDROCHLOROTHIAZIDE 25 MG PO TABS
25.0000 mg | ORAL_TABLET | Freq: Every day | ORAL | Status: DC
  Administered 2019-02-03: 25 mg via ORAL
  Filled 2019-02-02: qty 1

## 2019-02-02 MED ORDER — SODIUM CHLORIDE 0.9 % IR SOLN
Status: DC | PRN
  Administered 2019-02-02 (×2): 1000 mL

## 2019-02-02 MED ORDER — BUPIVACAINE HCL (PF) 0.5 % IJ SOLN
INTRAMUSCULAR | Status: AC
  Filled 2019-02-02: qty 30

## 2019-02-02 MED ORDER — MIDAZOLAM HCL 5 MG/5ML IJ SOLN
INTRAMUSCULAR | Status: DC | PRN
  Administered 2019-02-02 (×2): 1 mg via INTRAVENOUS

## 2019-02-02 MED ORDER — METHOCARBAMOL 500 MG IVPB - SIMPLE MED
500.0000 mg | Freq: Four times a day (QID) | INTRAVENOUS | Status: DC | PRN
  Filled 2019-02-02: qty 50

## 2019-02-02 MED ORDER — ONDANSETRON HCL 4 MG PO TABS: 4.0000 mg | ORAL_TABLET | Freq: Four times a day (QID) | ORAL | Status: DC | PRN

## 2019-02-02 MED ORDER — TELMISARTAN-HCTZ 80-25 MG PO TABS: 1.0000 | ORAL_TABLET | Freq: Every day | ORAL | Status: DC

## 2019-02-02 MED ORDER — TRANEXAMIC ACID-NACL 1000-0.7 MG/100ML-% IV SOLN
1000.0000 mg | INTRAVENOUS | Status: AC
  Administered 2019-02-02: 1000 mg via INTRAVENOUS

## 2019-02-02 SURGICAL SUPPLY — 57 items
BAG DECANTER FOR FLEXI CONT (MISCELLANEOUS) ×3 IMPLANT
BAG SPEC THK2 15X12 ZIP CLS (MISCELLANEOUS) ×1
BAG ZIPLOCK 12X15 (MISCELLANEOUS) ×3 IMPLANT
BLADE SAGITTAL 25.0X1.19X90 (BLADE) ×2 IMPLANT
BLADE SAGITTAL 25.0X1.19X90MM (BLADE) ×1
BNDG GAUZE ELAST 4 BULKY (GAUZE/BANDAGES/DRESSINGS) ×3 IMPLANT
BOWL SMART MIX CTS (DISPOSABLE) ×3 IMPLANT
CEMENT HV SMART SET (Cement) ×6 IMPLANT
CEMENT TIBIA MBT SIZE 2.5 (Knees) IMPLANT
CHLORAPREP W/TINT 26 (MISCELLANEOUS) ×4 IMPLANT
COMP FEM CEM MED RT LCS (Orthopedic Implant) ×3 IMPLANT
COMPONENT FEM CEM MED RT LCS (Orthopedic Implant) IMPLANT
COVER SURGICAL LIGHT HANDLE (MISCELLANEOUS) ×3 IMPLANT
COVER WAND RF STERILE (DRAPES) IMPLANT
CUFF TOURN SGL QUICK 34 (TOURNIQUET CUFF) ×2
CUFF TRNQT CYL 34X4.125X (TOURNIQUET CUFF) ×2 IMPLANT
DECANTER SPIKE VIAL GLASS SM (MISCELLANEOUS) ×3 IMPLANT
DRAPE IMP U-DRAPE 54X76 (DRAPES) ×3 IMPLANT
DRAPE SHEET LG 3/4 BI-LAMINATE (DRAPES) ×6 IMPLANT
DRSG ADAPTIC 3X8 NADH LF (GAUZE/BANDAGES/DRESSINGS) ×3 IMPLANT
DRSG PAD ABDOMINAL 8X10 ST (GAUZE/BANDAGES/DRESSINGS) ×3 IMPLANT
DURAPREP 26ML APPLICATOR (WOUND CARE) ×2 IMPLANT
ELECT REM PT RETURN 15FT ADLT (MISCELLANEOUS) ×3 IMPLANT
GAUZE SPONGE 4X4 12PLY STRL (GAUZE/BANDAGES/DRESSINGS) ×3 IMPLANT
GLOVE BIOGEL PI IND STRL 8 (GLOVE) ×1 IMPLANT
GLOVE BIOGEL PI IND STRL 8.5 (GLOVE) ×1 IMPLANT
GLOVE BIOGEL PI INDICATOR 8 (GLOVE) ×6
GLOVE BIOGEL PI INDICATOR 8.5 (GLOVE) ×6
GLOVE ECLIPSE 8.0 STRL XLNG CF (GLOVE) ×2 IMPLANT
GLOVE ECLIPSE 8.5 STRL (GLOVE) ×2 IMPLANT
GOWN STRL REUS W/ TWL LRG LVL3 (GOWN DISPOSABLE) ×1 IMPLANT
GOWN STRL REUS W/TWL 2XL LVL3 (GOWN DISPOSABLE) ×3 IMPLANT
GOWN STRL REUS W/TWL LRG LVL3 (GOWN DISPOSABLE) ×2
HANDPIECE INTERPULSE COAX TIP (DISPOSABLE) ×3
HOLDER FOLEY CATH W/STRAP (MISCELLANEOUS) ×2 IMPLANT
INSERT LCS COMP STD 10.0MM MED (Knees) ×2 IMPLANT
KIT TURNOVER KIT A (KITS) IMPLANT
MANIFOLD NEPTUNE II (INSTRUMENTS) ×3 IMPLANT
NS IRRIG 1000ML POUR BTL (IV SOLUTION) ×3 IMPLANT
PACK TOTAL KNEE CUSTOM (KITS) ×3 IMPLANT
PADDING CAST COTTON 6X4 STRL (CAST SUPPLIES) ×6 IMPLANT
PATELLA LCS 3PEG MED (Knees) ×2 IMPLANT
PIN STEINMAN FIXATION KNEE (PIN) ×2 IMPLANT
PROTECTOR NERVE ULNAR (MISCELLANEOUS) ×3 IMPLANT
SET HNDPC FAN SPRY TIP SCT (DISPOSABLE) ×1 IMPLANT
STAPLER VISISTAT 35W (STAPLE) ×3 IMPLANT
SUT BONE WAX W31G (SUTURE) ×3 IMPLANT
SUT ETHIBOND NAB CT1 #1 30IN (SUTURE) ×6 IMPLANT
SUT MNCRL AB 3-0 PS2 18 (SUTURE) ×3 IMPLANT
SUT VIC AB 2-0 PS2 27 (SUTURE) ×3 IMPLANT
TIBIA MBT CEMENT SIZE 2.5 (Knees) ×3 IMPLANT
TRAY FOL W/BAG SLVR 16FR STRL (SET/KITS/TRAYS/PACK) IMPLANT
TRAY FOLEY MTR SLVR 14FR STAT (SET/KITS/TRAYS/PACK) ×2 IMPLANT
TRAY FOLEY W/BAG SLVR 16FR LF (SET/KITS/TRAYS/PACK) ×2
UNDERPAD 30X30 (UNDERPADS AND DIAPERS) ×3 IMPLANT
WATER STERILE IRR 1000ML POUR (IV SOLUTION) ×6 IMPLANT
WRAP KNEE MAXI GEL POST OP (GAUZE/BANDAGES/DRESSINGS) ×3 IMPLANT

## 2019-02-02 NOTE — Anesthesia Preprocedure Evaluation (Addendum)
Anesthesia Evaluation  Patient identified by MRN, date of birth, ID band Patient awake    Reviewed: Allergy & Precautions, H&P , NPO status , Patient's Chart, lab work & pertinent test results  Airway Mallampati: I   Neck ROM: full    Dental no notable dental hx. (+) Teeth Intact   Pulmonary sleep apnea , former smoker,    Pulmonary exam normal breath sounds clear to auscultation       Cardiovascular hypertension, Pt. on medications Normal cardiovascular exam Rhythm:Regular Rate:Normal     Neuro/Psych PSYCHIATRIC DISORDERS Depression    GI/Hepatic negative GI ROS, Neg liver ROS,   Endo/Other    Renal/GU      Musculoskeletal  (+) Arthritis , Osteoarthritis and Rheumatoid disorders,    Abdominal Normal abdominal exam  (+)   Peds  Hematology negative hematology ROS (+)   Anesthesia Other Findings   Reproductive/Obstetrics                             Anesthesia Physical  Anesthesia Plan  ASA: II  Anesthesia Plan: General   Post-op Pain Management:  Regional for Post-op pain   Induction: Intravenous  PONV Risk Score and Plan: 2 and Ondansetron and Dexamethasone  Airway Management Planned: LMA  Additional Equipment: None  Intra-op Plan:   Post-operative Plan: Extubation in OR  Informed Consent: I have reviewed the patients History and Physical, chart, labs and discussed the procedure including the risks, benefits and alternatives for the proposed anesthesia with the patient or authorized representative who has indicated his/her understanding and acceptance.     Dental advisory given  Plan Discussed with: CRNA  Anesthesia Plan Comments:        Anesthesia Quick Evaluation

## 2019-02-02 NOTE — Evaluation (Signed)
Physical Therapy Evaluation Patient Details Name: Diana [name] MRN: [id number] DOB: April 10, 1951 Today's Date: 02/02/2019   History of Present Illness  R TKA  Clinical Impression  Pt is s/p TKA resulting in the deficits listed below (see PT Problem List). Pt ambulated 5' with RW, distance limited by R knee buckling. Initiated TKA HEP. Good progress expected.  Pt will benefit from skilled PT to increase their independence and safety with mobility to allow discharge to the venue listed below.      Follow Up Recommendations Follow surgeon's recommendation for DC plan and follow-up therapies    Equipment Recommendations  3in1 (PT);Rolling walker with 5" wheels    Recommendations for Other Services       Precautions / Restrictions Precautions Precautions: Knee Precaution Comments: reviewed no pillow under knee Restrictions Weight Bearing Restrictions: Yes RLE Weight Bearing: Partial weight bearing RLE Partial Weight Bearing Percentage or Pounds: 50%      Mobility  Bed Mobility Overal bed mobility: Needs Assistance Bed Mobility: Supine to Sit     Supine to sit: Min assist     General bed mobility comments: min A for RLE  Transfers Overall transfer level: Needs assistance Equipment used: Rolling walker (2 wheeled) Transfers: Sit to/from Stand Sit to Stand: Min assist         General transfer comment: min A to rise, VCs hand placement  Ambulation/Gait Ambulation/Gait assistance: Editor, commissioning (Feet): 5 Feet Assistive device: Rolling walker (2 wheeled) Gait Pattern/deviations: Step-to pattern;Decreased weight shift to right;Decreased stride length Gait velocity: decr   General Gait Details: RLE buckling with PWB, possibly due to adductor block not fully wearing off  Stairs            Wheelchair Mobility    Modified Rankin (Stroke Patients Only)       Balance Overall balance assessment: Needs assistance Sitting-balance support: Bilateral  upper extremity supported Sitting balance-Leahy Scale: Good     Standing balance support: Bilateral upper extremity supported Standing balance-Leahy Scale: Poor Standing balance comment: relies on BUE support                             Pertinent Vitals/Pain Pain Assessment: 0-10 Pain Score: 5  Pain Location: R knee with activity Pain Descriptors / Indicators: Sore Pain Intervention(s): Limited activity within patient's tolerance;Monitored during session;Premedicated before session;Ice applied    Home Living Family/patient expects to be discharged to:: Private residence Living Arrangements: Other relatives Available Help at Discharge: Family;Available 24 hours/day   Home Access: Stairs to enter Entrance Stairs-Rails: Doctor, general practice of Steps: 6 Home Layout: One level Home Equipment: None Additional Comments: lives with sister    Prior Function Level of Independence: Independent               Hand Dominance        Extremity/Trunk Assessment   Upper Extremity Assessment Upper Extremity Assessment: Overall WFL for tasks assessed    Lower Extremity Assessment Lower Extremity Assessment: RLE deficits/detail RLE Deficits / Details: SLR 2/5, knee ext 2/5, 5-50* AAROM R knee RLE Sensation: WNL RLE Coordination: decreased gross motor    Cervical / Trunk Assessment Cervical / Trunk Assessment: Normal  Communication   Communication: No difficulties  Cognition Arousal/Alertness: Awake/alert Behavior During Therapy: WFL for tasks assessed/performed Overall Cognitive Status: Within Functional Limits for tasks assessed  General Comments      Exercises Total Joint Exercises Ankle Circles/Pumps: AROM;Both;10 reps;Supine Long Arc Quad: AAROM;Right;5 reps;Seated Goniometric ROM: 5-50* AAROM R knee   Assessment/Plan    PT Assessment Patient needs continued PT services  PT  Problem List Decreased strength;Decreased range of motion;Decreased activity tolerance;Decreased balance;Decreased mobility;Pain;Decreased knowledge of use of DME       PT Treatment Interventions DME instruction;Gait training;Stair training;Functional mobility training;Therapeutic exercise;Therapeutic activities;Patient/family education    PT Goals (Current goals can be found in the Care Plan section)  Acute Rehab PT Goals Patient Stated Goal: to get other knee done then travel to [place] PT Goal Formulation: With patient Time For Goal Achievement: 02/09/19 Potential to Achieve Goals: Good    Frequency 7X/week   Barriers to discharge        Co-evaluation               AM-PAC PT "6 Clicks" Mobility  Outcome Measure Help needed turning from your back to your side while in a flat bed without using bedrails?: A Little Help needed moving from lying on your back to sitting on the side of a flat bed without using bedrails?: A Little Help needed moving to and from a bed to a chair (including a wheelchair)?: A Little Help needed standing up from a chair using your arms (e.g., wheelchair or bedside chair)?: A Little Help needed to walk in hospital room?: A Lot Help needed climbing 3-5 steps with a railing? : A Lot 6 Click Score: 16    End of Session Equipment Utilized During Treatment: Gait belt Activity Tolerance: Patient tolerated treatment well Patient left: in chair;with call bell/phone within reach Nurse Communication: Mobility status PT Visit Diagnosis: Muscle weakness (generalized) (M62.81);Difficulty in walking, not elsewhere classified (R26.2);Pain Pain - Right/Left: Right Pain - part of body: Knee    Time: 1856-3149 PT Time Calculation (min) (ACUTE ONLY): 26 min   Charges:   PT Evaluation $PT Eval Low Complexity: 1 Low PT Treatments $Therapeutic Activity: 8-22 mins        Blondell Reveal Kistler PT 02/02/2019  Acute Rehabilitation Services Pager  [phone] Office [phone]

## 2019-02-02 NOTE — Progress Notes (Signed)
Dr. Durward Fortes notified that pt is allergic to betadine/iodine and staph positive. Per Dr. Durward Fortes we will administer one dose of mupirocin in both nares prior to surgery.

## 2019-02-02 NOTE — Transfer of Care (Signed)
Immediate Anesthesia Transfer of Care Note  Patient: Diana [name]  Procedure(s) Performed: RIGHT TOTAL KNEE ARTHROPLASTY (Right )  Patient Location: PACU  Anesthesia Type:General  Level of Consciousness: awake, drowsy and patient cooperative  Airway & Oxygen Therapy: Patient Spontanous Breathing and Patient connected to face mask oxygen  Post-op Assessment: Report given to RN, Post -op Vital signs reviewed and stable and Patient moving all extremities X 4  Post vital signs: stable  Last Vitals:  Vitals Value Taken Time  BP 145/73 02/02/19 1304  Temp 37.3 C 02/02/19 1304  Pulse 76 02/02/19 1309  Resp 10 02/02/19 1309  SpO2 98 % 02/02/19 1309  Vitals shown include unvalidated device data.  Last Pain:  Vitals:   02/02/19 1304  TempSrc:   PainSc: Asleep      Patients Stated Pain Goal: 4 (14/48/18 5631)  Complications: No apparent anesthesia complications

## 2019-02-02 NOTE — Progress Notes (Signed)
Assisted Dr. Hatchett with right, ultrasound guided, adductor canal block. Side rails up, monitors on throughout procedure. See vital signs in flow sheet. Tolerated Procedure well.  

## 2019-02-02 NOTE — Op Note (Signed)
NAME: Diana [name], Diana A. MEDICAL RECORD LP:[id number] ACCOUNT [id number] DATE OF BIRTH:[birth year] FACILITY: [facility] LOCATION: [id number] PHYSICIAN:PETER Sharlotte Alamo, MD  OPERATIVE REPORT  DATE OF PROCEDURE:  02/02/2019  PREOPERATIVE DIAGNOSIS:  End-stage osteoarthritis, right knee.  POSTOPERATIVE DIAGNOSIS:  End-stage osteoarthritis, right knee.  PROCEDURE:  Right total knee replacement.  SURGEON:  [name], MD  ASSISTANT:  Biagio Borg, PA-C.  ANESTHESIA:  General laryngeal, adductor canal block and local Marcaine without epinephrine.  COMPLICATIONS:  None.  COMPONENTS:  LCS medium femoral component, a #2.5 rotating keeled tibial tray with a 10 mm polyethylene bridging bearing a metal backed 3 peg rotating patella.  Components were secured with polymethyl methacrylate.  DESCRIPTION OF PROCEDURE:  The patient was met in the holding area and identified the right knee as appropriate operative site and marked it accordingly.  Anesthesia performed an adductor canal block.  Any questions were answered.  The patient was then  taken to room 5.  She was placed under general laryngeal anesthesia without difficulty.  Nursing staff inserted a Foley catheter.  Urine was clear.  The right lower extremity was then placed in a thigh tourniquet.  The leg was prepped with chlorhexidine scrub, and ChloraPrep x2 from the tourniquet to the tips of the toes.  The patient had an ALLERGY TO BETADINE.  Sterile draping was performed.  Timeout was called.  Right lower extremity was then elevated and Esmarch exsanguinated with a proximal tourniquet at 325 mmHg.  A midline longitudinal incision was then made centered about the patella extending from the superior pouch to the tibial tubercle.  Via sharp dissection, incision was carried down to subcutaneous tissue.  The first layer of capsule was incised in the  midline.  A medial parapatellar incision was then made with the Bovie.  The joint was entered.   There was minimal effusion.  Patella was everted 180 degrees laterally and the knee flexed to 90 degrees.  There were moderately large osteophytes along the  medial and lateral femoral condyle and medial tibial plateau.  These were removed.  I sized a medium femoral component.  First bony cut was then made transversely in the proximal tibia with a 7 degree angle of declination using the external tibial guide.  I checked the appropriate alignment with the external guide after each bony cut on the tibia and the femur.  Subsequent  cuts were then made on the femur using the medium femoral jigs.  Flexion and extension gaps were symmetrical at 10 mm.  Lamina spreaders were inserted in the medial and lateral compartments that I could remove medial and lateral menisci, ACL and PCL and osteophytes from the posterior femoral condyle.  I used a 3/4  inch curved osteotome.  MCL and LCL remained intact throughout the procedure.  A 4 degree distal femoral valgus cut was made.  The finishing guide was then applied to the femur for tapering cuts in the center hole.  Retractors then placed about the tibia was advanced anteriorly.  I measured a 2.5 tibial tray.  This was pinned in place, alignment checked and the center hole was made followed by the keeled cut.  With the tibial jig in place, the 10 mm polyethylene  bridging bearing was applied followed by the standard trial femoral component.  The entire construct was reduced.  I thought we had full extension and flexion over 120 degrees without malrotation of the components.  No opening with varus or valgus  stress.  Patella was prepared by removing approximately  7 mm of bone leaving 13 mm of patella thickness.  Patellar jig was applied.  Three holes were then made.  Trial patella inserted and reduced.  Again, through a full range of motion, no instability.  Trial components were removed.  The joint was copiously irrigated with saline solution.  Final  components were then impacted with polymethyl methacrylate.  We initially applied the 2.5 tibial tray, the 10 mm polyethylene bridging bearing and the medium femoral component.  These were impacted and then the joint extended with compression  vertically.  Any extraneous methacrylate was removed with a Valora Corporal.  Patella was applied with the same methacrylate and a clamp.  At approximately 16 minutes, the methacrylate had matured during which time we injected the joint with 0.25% Marcaine without  epinephrine.  The joint was then inspected.  There was no further extraneous polymethylmethacrylate.  Tourniquet was deflated at 76 minutes.  We did use IV tranexamic acid and topical tranexamic acid as the patient is a Jehovah's Witness.  We had excellent control of  bleeding.  Deep capsule was then closed with a running #1 Ethibond superficial capsule with 0 Vicryl, subcutaneous with 3-0 Monocryl, skin closed with skin clips.  Sterile bulky dressing was applied followed by the patient's support stocking.  The patient was then awoken and returned to the postanesthesia recovery room in satisfactory condition.  TN/NUANCE  D:02/02/2019 T:02/02/2019 JOB:[id number]

## 2019-02-02 NOTE — Anesthesia Procedure Notes (Signed)
Procedure Name: LMA Insertion Date/Time: 02/02/2019 10:45 AM Performed by: Lissa Morales, CRNA Pre-anesthesia Checklist: Patient identified, Emergency Drugs available, Suction available and Patient being monitored Patient Re-evaluated:Patient Re-evaluated prior to induction Oxygen Delivery Method: Circle system utilized Induction Type: IV induction LMA: LMA with gastric port inserted LMA Size: 4.0 Tube type: Oral Number of attempts: 1 Airway Equipment and Method: Oral airway Placement Confirmation: positive ETCO2 and breath sounds checked- equal and bilateral Tube secured with: Tape Dental Injury: Teeth and Oropharynx as per pre-operative assessment

## 2019-02-02 NOTE — H&P (Signed)
The recent History & Physical has been reviewed. I have personally examined the patient today. There is no interval change to the documented History & Physical. The patient would like to proceed with the procedure.  Garald Balding 02/02/2019,  9:45 AM

## 2019-02-02 NOTE — Op Note (Signed)
PATIENT ID:      Diana [name]  MRN:     [id number] DOB/AGE:    Feb 27, 1951 / 68 y.o.       OPERATIVE REPORT    DATE OF PROCEDURE:  02/02/2019       PREOPERATIVE DIAGNOSIS: end stage  right knee osteoarthritis                                                       Estimated body mass index is 27.32 kg/m as calculated from the following:   Height as of this encounter: 5\' 2"  (1.575 m).   Weight as of this encounter: 67.8 kg.     POSTOPERATIVE DIAGNOSIS:   right knee osteoarthritis                                                                     Estimated body mass index is 27.32 kg/m as calculated from the following:   Height as of this encounter: 5\' 2"  (1.575 m).   Weight as of this encounter: 67.8 kg.     PROCEDURE:  Procedure(s): RIGHT TOTAL KNEE ARTHROPLASTY     SURGEON:  [name], MD    ASSISTANT:   Biagio Borg, PA-C   (Present and scrubbed throughout the case, critical for assistance with exposure, retraction, instrumentation, and closure.)          ANESTHESIA: local, regional and general     DRAINS: none :      TOURNIQUET TIME:  Total Tourniquet Time Documented: Thigh (Right) - 76 minutes Total: Thigh (Right) - 76 minutes     COMPLICATIONS:  None   CONDITION:  stable  PROCEDURE IN DETAIL: [place] 02/02/2019, 12:26 PM

## 2019-02-02 NOTE — Progress Notes (Signed)
Pt refused CPAP qhs.  Pt states that she hasn't worn her cpap in more than a year and even gave it away.  States that she sleeps elevated instead.  Pt encouraged to contact RT should she change her mind.

## 2019-02-02 NOTE — Anesthesia Postprocedure Evaluation (Signed)
Anesthesia Post Note  Patient: Diana [name]  Procedure(s) Performed: RIGHT TOTAL KNEE ARTHROPLASTY (Right )     Patient location during evaluation: PACU Anesthesia Type: General Level of consciousness: sedated Pain management: pain level controlled Vital Signs Assessment: post-procedure vital signs reviewed and stable Respiratory status: spontaneous breathing Cardiovascular status: stable Postop Assessment: no apparent nausea or vomiting Anesthetic complications: no    Last Vitals:  Vitals:   02/02/19 1315 02/02/19 1330  BP: 137/61 (!) 122/57  Pulse: 74 72  Resp: 10 (!) 9  Temp:    SpO2: 97% 96%    Last Pain:  Vitals:   02/02/19 1330  TempSrc:   PainSc: 0-No pain   Pain Goal: Patients Stated Pain Goal: 4 (02/02/19 0824)                 Huston Foley

## 2019-02-03 ENCOUNTER — Encounter (HOSPITAL_COMMUNITY): Payer: Self-pay | Admitting: Orthopaedic Surgery

## 2019-02-03 DIAGNOSIS — M1711 Unilateral primary osteoarthritis, right knee: Secondary | ICD-10-CM | POA: Diagnosis not present

## 2019-02-03 LAB — BASIC METABOLIC PANEL
Anion gap: 6 (ref 5–15)
BUN: 15 mg/dL (ref 8–23)
CO2: 27 mmol/L (ref 22–32)
Calcium: 8.5 mg/dL — ABNORMAL LOW (ref 8.9–10.3)
Chloride: 105 mmol/L (ref 98–111)
Creatinine, Ser: 0.85 mg/dL (ref 0.44–1.00)
GFR calc Af Amer: >60 mL/min (ref >60)
GFR calc non Af Amer: >60 mL/min (ref >60)
Glucose, Bld: 132 mg/dL — ABNORMAL HIGH (ref 70–99)
Potassium: 4.2 mmol/L (ref 3.5–5.1)
Sodium: 138 mmol/L (ref 135–145)

## 2019-02-03 LAB — CBC
HCT: 37.9 % (ref 36.0–46.0)
Hemoglobin: 12.1 g/dL (ref 12.0–15.0)
MCH: 30.1 pg (ref 26.0–34.0)
MCHC: 31.9 g/dL (ref 30.0–36.0)
MCV: 94.3 fL (ref 80.0–100.0)
Platelets: 251 10*3/uL (ref 150–400)
RBC: 4.02 MIL/uL (ref 3.87–5.11)
RDW: 12.1 % (ref 11.5–15.5)
WBC: 10.8 10*3/uL — ABNORMAL HIGH (ref 4.0–10.5)
nRBC: 0 % (ref 0.0–0.2)

## 2019-02-03 MED ORDER — HYDROCODONE-ACETAMINOPHEN 5-325 MG PO TABS: 1.0000 | ORAL_TABLET | ORAL | 0 refills | Status: DC | PRN

## 2019-02-03 MED ORDER — ASPIRIN 81 MG PO CHEW: 81.0000 mg | CHEWABLE_TABLET | Freq: Two times a day (BID) | ORAL | Status: DC

## 2019-02-03 MED ORDER — ROPIVACAINE HCL 7.5 MG/ML IJ SOLN
INTRAMUSCULAR | Status: DC | PRN
  Administered 2019-02-02 (×6): 5 mL via PERINEURAL

## 2019-02-03 MED ORDER — CLONIDINE HCL (ANALGESIA) 100 MCG/ML EP SOLN
EPIDURAL | Status: DC | PRN
  Administered 2019-02-02: 100 ug

## 2019-02-03 MED ORDER — METHOCARBAMOL 500 MG PO TABS: 500.0000 mg | ORAL_TABLET | Freq: Three times a day (TID) | ORAL | 0 refills | Status: DC | PRN

## 2019-02-03 NOTE — Discharge Summary (Signed)
Diana Fears, MD   Biagio Borg, PA-C [address], [place], [place]  [address]                             [phone]  PATIENT ID: Diana [name]        MRN:  [id number]          DOB/AGE: 1950/12/15 / 68 y.o.    DISCHARGE SUMMARY  ADMISSION DATE:    02/02/2019 DISCHARGE DATE:   02/03/2019   ADMISSION DIAGNOSIS: right knee osteoarthritis    DISCHARGE DIAGNOSIS:  right knee osteoarthritis    ADDITIONAL DIAGNOSIS: Active Problems:   S/P total knee arthroplasty, right  Past Medical History:  Diagnosis Date  . Depression   . Hyperlipidemia   . Hypertension   . Kidney disease    "my one kidney doesnt give me problems"   . 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 Rheumatoid arthritis(714.0)   . Sleep apnea    uses cpap-mod-severe; i used to but then i got tired of it     PROCEDURE: Procedure(s): RIGHT TOTAL KNEE ARTHROPLASTY  on 02/02/2019  CONSULTS: none    HISTORY: Diana [name], 68 y.o. female, has a history of pain and functional disability in the right knee due to arthritis and has failed non-surgical conservative treatments for greater than 12 weeks to includeNSAID's and/or analgesics, corticosteriod injections, flexibility and strengthening excercises, weight reduction as appropriate and activity modification.  Onset of symptoms was gradual, starting 10 years ago with gradually worsening course since that time. The patient noted no past surgery on the right knee(s).  Patient currently rates pain in the right knee(s) at 8 out of 10 with activity. Patient has worsening of pain with activity and weight bearing, crepitus and joint swelling.  Patient has evidence of subchondral cysts, subchondral sclerosis, periarticular osteophytes, joint subluxation and joint space narrowing by imaging studies. There is no active infection  HOSPITAL COURSE:  Diana [name] is a 68 y.o. admitted on 02/02/2019 and found to have a diagnosis of right knee osteoarthritis.  After appropriate  laboratory studies were obtained  they were taken to the operating room on 02/02/2019 and underwent  Procedure(s): RIGHT TOTAL KNEE ARTHROPLASTY  .   They were given perioperative antibiotics:  Anti-infectives (From admission, onward)   Start     Dose/Rate Route Frequency Ordered Stop   02/02/19 1630  ceFAZolin (ANCEF) IVPB 2g/100 mL premix     2 g 200 mL/hr over 30 Minutes Intravenous Every 6 hours 02/02/19 1614 02/02/19 2330   02/02/19 0815  ceFAZolin (ANCEF) IVPB 2g/100 mL premix     2 g 200 mL/hr over 30 Minutes Intravenous On call to O.R. 02/02/19 0805 02/02/19 1034    .  Tolerated the procedure well.  Placed with a foley intraoperatively.     Toradol was given post op.  POD #1, allowed out of bed to a chair.  PT for ambulation and exercise program.  Foley D/C'd in morning.  IV saline locked.  O2 discontionued.  The remainder of the hospital course was dedicated to ambulation and strengthening.   The patient was discharged on 1 Day Post-Op in  Stable condition.  Blood products given:none  DIAGNOSTIC STUDIES: Recent vital signs:  Patient Vitals for the past 24 hrs:  BP Temp Temp src Pulse Resp SpO2  02/03/19 0922 136/66 97.8 F (36.6 C) Oral 80 16 99 %  02/03/19 0426 Marland Kitchen)  132/59 (!) 97.5 F (36.4 C) Oral 70 16 99 %  02/02/19 2343 (!) 120/55 97.6 F (36.4 C) Oral 79 17 95 %  02/02/19 2000 130/61 (!) 97.5 F (36.4 C) Oral 79 16 99 %  02/02/19 1743 133/77 - - 83 16 98 %  02/02/19 1643 (!) 141/62 97.7 F (36.5 C) Axillary 74 12 99 %  02/02/19 1542 130/62 97.6 F (36.4 C) Oral 77 12 99 %  02/02/19 1500 (!) 121/53 (!) 97.5 F (36.4 C) - 76 11 100 %  02/02/19 1400 133/67 98.9 F (37.2 C) - 77 12 98 %  02/02/19 1345 (!) 119/59 - - 79 15 98 %  02/02/19 1330 (!) 122/57 - - 72 (!) 9 96 %  02/02/19 1315 137/61 - - 74 10 97 %  02/02/19 1304 (!) 145/73 99.1 F (37.3 C) - 81 13 98 %       Recent laboratory studies: Recent Labs    02/01/19 1047 02/03/19 0303  WBC 6.8 10.8*   HGB 13.8 12.1  HCT 43.4 37.9  PLT 305 251   Recent Labs    02/01/19 1047 02/03/19 0303  NA 144 138  K 4.2 4.2  CL 104 105  CO2 32 27  BUN 22 15  CREATININE 0.89 0.85  GLUCOSE 92 132*  CALCIUM 9.9 8.5*   Lab Results  Component Value Date   INR 0.9 02/01/2019   INR 1.02 06/27/2009     Recent Radiographic Studies :  Dg Chest 2 View  Result Date: 02/01/2019 CLINICAL DATA:  Preop total knee replacement EXAM: CHEST - 2 VIEW COMPARISON:  February 13, 2015. FINDINGS: The heart size and mediastinal contours are within normal limits. Both lungs are clear. Degenerative changes seen in the midthoracic spine. No acute osseous findings. IMPRESSION: No active cardiopulmonary disease. Electronically Signed   By: Jonna ClarkBindu  Avutu M.D.   On: 02/01/2019 15:52    DISCHARGE INSTRUCTIONS: Discharge Instructions    CPM   Complete by: As directed    Continuous passive motion machine (CPM):      Use the CPM from 0 to 60 for 6-8 hours per day.      You may increase by 5-10 degrees per day.  You may break it up into 2 or 3 sessions per day.      Use CPM for 3-4  weeks or until you are told to stop.   Call MD / Call 911   Complete by: As directed    If you experience chest pain or shortness of breath, CALL 911 and be transported to the hospital emergency room.  If you develope a fever above 101 F, pus (white drainage) or increased drainage or redness at the wound, or calf pain, call your surgeon's office.   Change dressing   Complete by: As directed    DO NOT CHANGE YOUR DRESSING   Constipation Prevention   Complete by: As directed    Drink plenty of fluids.  Prune juice may be helpful.  You may use a stool softener, such as Colace (over the counter) 100 mg twice a day.  Use MiraLax (over the counter) for constipation as needed.   Diet general   Complete by: As directed    Discharge instructions   Complete by: As directed    INSTRUCTIONS AFTER JOINT REPLACEMENT   Remove items at home which could  result in a fall. This includes throw rugs or furniture in walking pathways ICE to the affected joint every three  hours while awake for 30 minutes at a time, for at least the first 3-5 days, and then as needed for pain and swelling.  Continue to use ice for pain and swelling. You may notice swelling that will progress down to the foot and ankle.  This is normal after surgery.  Elevate your leg when you are not up walking on it.   Continue to use the breathing machine you got in the hospital (incentive spirometer) which will help keep your temperature down.  It is common for your temperature to cycle up and down following surgery, especially at night when you are not up moving around and exerting yourself.  The breathing machine keeps your lungs expanded and your temperature down.   DIET:  As you were doing prior to hospitalization, we recommend a well-balanced diet.  DRESSING / WOUND CARE / SHOWERING  Keep the surgical dressing until follow up.  The dressing is water proof, so you can shower without any extra covering.  IF THE DRESSING FALLS OFF or the wound gets wet inside, change the dressing with sterile gauze.  Please use good hand washing techniques before changing the dressing.  Do not use any lotions or creams on the incision until instructed by your surgeon.    ACTIVITY  Increase activity slowly as tolerated, but follow the weight bearing instructions below.   No driving for 6 weeks or until further direction given by your physician.  You cannot drive while taking narcotics.  No lifting or carrying greater than 10 lbs. until further directed by your surgeon. Avoid periods of inactivity such as sitting longer than an hour when not asleep. This helps prevent blood clots.  You may return to work once you are authorized by your doctor.     WEIGHT BEARING   Partial weight bearing with assist device as directed.  50%   EXERCISES  Results after joint replacement surgery are often greatly  improved when you follow the exercise, range of motion and muscle strengthening exercises prescribed by your doctor. Safety measures are also important to protect the joint from further injury. Any time any of these exercises cause you to have increased pain or swelling, decrease what you are doing until you are comfortable again and then slowly increase them. If you have problems or questions, call your caregiver or physical therapist for advice.   Rehabilitation is important following a joint replacement. After just a few days of immobilization, the muscles of the leg can become weakened and shrink (atrophy).  These exercises are designed to build up the tone and strength of the thigh and leg muscles and to improve motion. Often times heat used for twenty to thirty minutes before working out will loosen up your tissues and help with improving the range of motion but do not use heat for the first two weeks following surgery (sometimes heat can increase post-operative swelling).   These exercises can be done on a training (exercise) mat, on the floor, on a table or on a bed. Use whatever works the best and is most comfortable for you.    Use music or television while you are exercising so that the exercises are a pleasant break in your day. This will make your life better with the exercises acting as a break in your routine that you can look forward to.   Perform all exercises about fifteen times, three times per day or as directed.  You should exercise both the operative leg and the other leg  as well.   Exercises include:  Quad Sets - Tighten up the muscle on the front of the thigh (Quad) and hold for 5-10 seconds.   Straight Leg Raises - With your knee straight (if you were given a brace, keep it on), lift the leg to 60 degrees, hold for 3 seconds, and slowly lower the leg.  Perform this exercise against resistance later as your leg gets stronger.  Leg Slides: Lying on your back, slowly slide your foot  toward your buttocks, bending your knee up off the floor (only go as far as is comfortable). Then slowly slide your foot back down until your leg is flat on the floor again.  Angel Wings: Lying on your back spread your legs to the side as far apart as you can without causing discomfort.  Hamstring Strength:  Lying on your back, push your heel against the floor with your leg straight by tightening up the muscles of your buttocks.  Repeat, but this time bend your knee to a comfortable angle, and push your heel against the floor.  You may put a pillow under the heel to make it more comfortable if necessary.   A rehabilitation program following joint replacement surgery can speed recovery and prevent re-injury in the future due to weakened muscles. Contact your doctor or a physical therapist for more information on knee rehabilitation.    CONSTIPATION  Constipation is defined medically as fewer than three stools per week and severe constipation as less than one stool per week.  Even if you have a regular bowel pattern at home, your normal regimen is likely to be disrupted due to multiple reasons following surgery.  Combination of anesthesia, postoperative narcotics, change in appetite and fluid intake all can affect your bowels.   YOU MUST use at least one of the following options; they are listed in order of increasing strength to get the job done.  They are all available over the counter, and you may need to use some, POSSIBLY even all of these options:    Drink plenty of fluids (prune juice may be helpful) and high fiber foods Colace 100 mg by mouth twice a day  Senokot for constipation as directed and as needed Dulcolax (bisacodyl), take with full glass of water  Miralax (polyethylene glycol) once or twice a day as needed.  If you have tried all these things and are unable to have a bowel movement in the first 3-4 days after surgery call either your surgeon or your primary doctor.    If you  experience loose stools or diarrhea, hold the medications until you stool forms back up.  If your symptoms do not get better within 1 week or if they get worse, check with your doctor.  If you experience "the worst abdominal pain ever" or develop nausea or vomiting, please contact the office immediately for further recommendations for treatment.   ITCHING:  If you experience itching with your medications, try taking only a single pain pill, or even half a pain pill at a time.  You can also use Benadryl over the counter for itching or also to help with sleep.   TED HOSE STOCKINGS:  Use stockings on both legs until for at least 2 weeks or as directed by physician office. They may be removed at night for sleeping.  MEDICATIONS:  See your medication summary on the "After Visit Summary" that nursing will review with you.  You may have some home medications which will be placed  on hold until you complete the course of blood thinner medication.  It is important for you to complete the blood thinner medication as prescribed.  PRECAUTIONS:  If you experience chest pain or shortness of breath - call 911 immediately for transfer to the hospital emergency department.   If you develop a fever greater that 101 F, purulent drainage from wound, increased redness or drainage from wound, foul odor from the wound/dressing, or calf pain - CONTACT YOUR SURGEON.                                                   FOLLOW-UP APPOINTMENTS:  If you do not already have a post-op appointment, please call the office for an appointment to be seen by your surgeon.  Guidelines for how soon to be seen are listed in your "After Visit Summary", but are typically between 1-4 weeks after surgery.  OTHER INSTRUCTIONS:   Knee Replacement:  Do not place pillow under knee, focus on keeping the knee straight while resting. CPM instructions: 0-90 degrees, 2 hours in the morning, 2 hours in the afternoon, and 2 hours in the evening. Place foam  block, curve side up under heel at all times except when in CPM or when walking.  DO NOT modify, tear, cut, or change the foam block in any way.  MAKE SURE YOU:  Understand these instructions.  Get help right away if you are not doing well or get worse.    Thank you for letting us be a part of your medical care team.  It is a privilege we respect greatly.  We hope these instructions will help you stay on track for a fast and full recovery!   Do not put a pillow under the knee. Place it under the heel.   Complete by: As directed    Driving restrictions   Complete by: As directed    No driving for 6 weeks   Increase activity slowly as tolerated   Complete by: As directed    Lifting restrictions   Complete by: As directed    No lifting for 6 weeks   Partial weight bearing   Complete by: As directed    % Body Weight: 50%   Laterality: right   Extremity: Lower   Patient may shower   Complete by: As directed    You may shower over the brown dressing   TED hose   Complete by: As directed    Use stockings (TED hose) for 2-3 weeks on right leg.  You may remove them at night for sleeping.      DISCHARGE MEDICATIONS:   Allergies as of 02/03/2019      Reactions   Other    NO BLOOD PRODUCTS    Iodine    No shellfish allergy per patient    Latex Rash      Medication List    STOP taking these medications   Adalimumab 40 MG/0.8ML Pnkt Commonly known as: Humira Pen   diclofenac sodium 1 % Gel Commonly known as: VOLTAREN     TAKE these medications   aspirin 81 MG chewable tablet Chew 1 tablet (81 mg total) by mouth 2 (two) times daily.   CENTRUM SILVER PO Take 1 tablet by mouth daily.   Fish Oil 500 MG Caps Take 500 mg by mouth daily.  FLUoxetine 10 MG capsule Commonly known as: PROZAC Take 10 mg by mouth daily.   fluticasone 50 MCG/ACT nasal spray Commonly known as: FLONASE Place 2 sprays into both nostrils daily as needed for allergies or rhinitis.    HYDROcodone-acetaminophen 5-325 MG tablet Commonly known as: NORCO/VICODIN Take 1-2 tablets by mouth every 4 (four) hours as needed for moderate pain (pain score 4-6).   methocarbamol 500 MG tablet Commonly known as: ROBAXIN Take 1 tablet (500 mg total) by mouth every 8 (eight) hours as needed for muscle spasms.   telmisartan-hydrochlorothiazide 80-25 MG tablet Commonly known as: MICARDIS HCT Take 1 tablet by mouth daily.   vitamin C 500 MG tablet Commonly known as: ASCORBIC ACID Take 500 mg by mouth daily.   VITAMIN D PO Take 2,000 Units by mouth daily.            Durable Medical Equipment  (From admission, onward)         Start     Ordered   02/02/19 1615  DME Walker rolling  Once    Question:  Patient needs a walker to treat with the following condition  Answer:  S/P total knee arthroplasty, right   02/02/19 1614   02/02/19 1615  DME 3 n 1  Once     02/02/19 1614   02/02/19 1615  DME Bedside commode  Once    Question:  Patient needs a bedside commode to treat with the following condition  Answer:  S/P total knee arthroplasty, right   02/02/19 1614           Discharge Care Instructions  (From admission, onward)         Start     Ordered   02/03/19 0000  Partial weight bearing    Question Answer Comment  % Body Weight 50%   Laterality right   Extremity Lower      02/03/19 1203   02/03/19 0000  Change dressing    Comments: DO NOT CHANGE YOUR DRESSING   02/03/19 1203          FOLLOW UP VISIT:   Follow-up Information    Valeria BatmanWhitfield, Peter W, MD Follow up in 2 week(s).   Specialty: Orthopedic Surgery Contact information: [address] [place] [address] [phone]           DISPOSITION:   Home  CONDITION:  Stable   Oris DroneBrian D. Aleda Granaetrarca, PA-C [facility] [phone]  02/03/2019 12:05 PM

## 2019-02-03 NOTE — Progress Notes (Signed)
Physical Therapy Treatment Patient Details Name: Diana [name] MRN: [id number] DOB: May 11, 1951 Today's Date: 02/03/2019    History of Present Illness R TKA    PT Comments    POD # 1 am session Educated on [place].  Assisted with amb.  General Gait Details: 75% VC's on PWB and 25% VC's on safety with turns and proper walker to self distance       assisted to bathroom and amb in hallway.  Then returned to room to perform some TE's following HEP handout.  Instructed on proper tech, freq as well as use of ICE.   Pt will need another PT session to address stairs.   Follow Up Recommendations  Follow surgeon's recommendation for DC plan and follow-up therapies     Equipment Recommendations  3in1 (PT);Rolling walker with 5" wheels    Recommendations for Other Services       Precautions / Restrictions Precautions Precautions: Knee Precaution Comments: reviewed no pillow under knee Restrictions Weight Bearing Restrictions: Yes RLE Weight Bearing: Weight bearing as tolerated RLE Partial Weight Bearing Percentage or Pounds: 50%    Mobility  Bed Mobility               General bed mobility comments: OOB in recliner  Transfers Overall transfer level: Needs assistance Equipment used: Rolling walker (2 wheeled) Transfers: Sit to/from Stand Sit to Stand: Supervision         General transfer comment: 25% VC's on safety with turns  Ambulation/Gait Ambulation/Gait assistance: Supervision;Min guard Gait Distance (Feet): 22 Feet Assistive device: Rolling walker (2 wheeled) Gait Pattern/deviations: Step-to pattern Gait velocity: decreased   General Gait Details: 75% VC's on PWB and 25% VC's on safety with turns and proper walker to self distance       assisted to bathroom and amb in hallway   Stairs             Wheelchair Mobility    Modified Rankin (Stroke Patients Only)       Balance                                            Cognition  Arousal/Alertness: Awake/alert Behavior During Therapy: WFL for tasks assessed/performed Overall Cognitive Status: Within Functional Limits for tasks assessed                                        Exercises      General Comments        Pertinent Vitals/Pain Pain Assessment: Faces Faces Pain Scale: Hurts a little bit Pain Location: R knee with activity Pain Descriptors / Indicators: Sore Pain Intervention(s): Monitored during session;Repositioned;Ice applied    Home Living                      Prior Function            PT Goals (current goals can now be found in the care plan section) Progress towards PT goals: Progressing toward goals    Frequency    7X/week      PT Plan Current plan remains appropriate    Co-evaluation              AM-PAC PT "6 Clicks" Mobility   Outcome Measure  Help needed turning from  your back to your side while in a flat bed without using bedrails?: A Little Help needed moving from lying on your back to sitting on the side of a flat bed without using bedrails?: A Little Help needed moving to and from a bed to a chair (including a wheelchair)?: A Little Help needed standing up from a chair using your arms (e.g., wheelchair or bedside chair)?: A Little Help needed to walk in hospital room?: A Little Help needed climbing 3-5 steps with a railing? : A Little 6 Click Score: 18    End of Session Equipment Utilized During Treatment: Gait belt Activity Tolerance: Patient tolerated treatment well Patient left: in chair;with call bell/phone within reach Nurse Communication: Mobility status PT Visit Diagnosis: Muscle weakness (generalized) (M62.81);Difficulty in walking, not elsewhere classified (R26.2);Pain Pain - Right/Left: Right Pain - part of body: Knee     Time: 7353-2992 PT Time Calculation (min) (ACUTE ONLY): 25 min  Charges:  $Gait Training: 8-22 mins $Therapeutic Exercise: 8-22 mins                      Felecia Shelling  PTA Acute  Rehabilitation Services Pager      [phone] Office      [phone]

## 2019-02-03 NOTE — Anesthesia Procedure Notes (Signed)
Anesthesia Regional Block: Adductor canal block   Pre-Anesthetic Checklist: ,, timeout performed, Correct Patient, Correct Site, Correct Laterality, Correct Procedure, Correct Position, site marked, Risks and benefits discussed,  Surgical consent,  Pre-op evaluation,  At surgeon's request and post-op pain management  Laterality: Lower and Right  Prep: chloraprep       Needles:  Injection technique: Single-shot  Needle Type: Echogenic Stimulator Needle     Needle Length: 10cm  Needle Gauge: 21   Needle insertion depth: 3 cm   Additional Needles:   Procedures:,,,, ultrasound used (permanent image in chart),,,,  Narrative:  Start time: 02/02/2019 10:02 AM End time: 02/02/2019 10:12 AM Injection made incrementally with aspirations every 5 mL. Anesthesiologist: Lyn Hollingshead, MD

## 2019-02-03 NOTE — Plan of Care (Signed)
Patient is ready to be discharged.

## 2019-02-03 NOTE — Progress Notes (Signed)
Physical Therapy Treatment Patient Details Name: Diana [name] MRN: [id number] DOB: [birth year] Today's Date: 02/03/2019    History of Present Illness R TKA    PT Comments    POD # 1 pm session Assisted with amb and practiced stairs. General Gait Details: 75% VC's on PWB and 25% VC's on safety with turns and proper walker to self distance.  General stair comments: one rail/one crutch 75% VC's on proper sequencing and PWB  Addressed all mobility questions, discussed appropriate activity, educated on use of ICE.  Pt ready for D/C to home.    Follow Up Recommendations  Follow surgeon's recommendation for DC plan and follow-up therapies     Equipment Recommendations  3in1 (PT);Rolling walker with 5" wheels    Recommendations for Other Services       Precautions / Restrictions Precautions Precautions: Knee Precaution Comments: reviewed no pillow under knee Restrictions Weight Bearing Restrictions: Yes RLE Weight Bearing: Weight bearing as tolerated RLE Partial Weight Bearing Percentage or Pounds: 50%    Mobility  Bed Mobility               General bed mobility comments: OOB in recliner  Transfers Overall transfer level: Needs assistance Equipment used: Rolling walker (2 wheeled) Transfers: Sit to/from Stand Sit to Stand: Supervision         General transfer comment: 25% VC's on safety with turns  Ambulation/Gait Ambulation/Gait assistance: Supervision;Min guard Gait Distance (Feet): 22 Feet Assistive device: Rolling walker (2 wheeled) Gait Pattern/deviations: Step-to pattern Gait velocity: decreased   General Gait Details: 75% VC's on PWB and 25% VC's on safety with turns and proper walker to self distance       assisted to bathroom and amb in hallway   Stairs Stairs: Yes Stairs assistance: Min assist;Min guard Stair Management: One rail Right;With crutches Number of Stairs: 4 General stair comments: one rail/one crutch 75% VC's on proper sequencing and  PWB   Wheelchair Mobility    Modified Rankin (Stroke Patients Only)       Balance                                            Cognition Arousal/Alertness: Awake/alert Behavior During Therapy: WFL for tasks assessed/performed Overall Cognitive Status: Within Functional Limits for tasks assessed                                        Exercises      General Comments        Pertinent Vitals/Pain Pain Assessment: Faces Faces Pain Scale: Hurts a little bit Pain Location: R knee with activity Pain Descriptors / Indicators: Sore Pain Intervention(s): Monitored during session;Repositioned;Ice applied    Home Living                      Prior Function            PT Goals (current goals can now be found in the care plan section) Progress towards PT goals: Progressing toward goals    Frequency    7X/week      PT Plan Current plan remains appropriate    Co-evaluation              AM-PAC PT "6 Clicks" Mobility   Outcome Measure  Help needed turning from your back to your side while in a flat bed without using bedrails?: A Little Help needed moving from lying on your back to sitting on the side of a flat bed without using bedrails?: A Little Help needed moving to and from a bed to a chair (including a wheelchair)?: A Little Help needed standing up from a chair using your arms (e.g., wheelchair or bedside chair)?: A Little Help needed to walk in hospital room?: A Little Help needed climbing 3-5 steps with a railing? : A Little 6 Click Score: 18    End of Session Equipment Utilized During Treatment: Gait belt Activity Tolerance: Patient tolerated treatment well Patient left: in chair;with call bell/phone within reach Nurse Communication: (pt ready for D/C to home) PT Visit Diagnosis: Muscle weakness (generalized) (M62.81);Difficulty in walking, not elsewhere classified (R26.2);Pain Pain - Right/Left: Right Pain -  part of body: Knee     Time: 1345-1410 PT Time Calculation (min) (ACUTE ONLY): 25 min  Charges:  $Gait Training: 8-22 mins $Therapeutic Activity: 8-22 mins                     Rica Koyanagi  PTA Acute  Rehabilitation Services Pager      [phone] Office      [phone]

## 2019-02-03 NOTE — Op Note (Signed)
PATIENT ID: Diana [name]        MRN:  [id number]          DOB/AGE: [birth year] / 68 y.o.    Norlene Campbell, MD   Jacqualine Code, PA-C [address] [place], [place]  [address]                             [phone]   PROGRESS NOTE  Subjective:  negative for Chest Pain  negative for Shortness of Breath  negative for Nausea/Vomiting   negative for Calf Pain    Tolerating Diet: yes         Patient reports pain as mild.     Adductor canal block still in effect-comfortable  Objective: Vital signs in last 24 hours:    Patient Vitals for the past 24 hrs:  BP Temp Temp src Pulse Resp SpO2 Height Weight  02/03/19 0426 (!) 132/59 (!) 97.5 F (36.4 C) Oral 70 16 99 % - -  02/02/19 2343 (!) 120/55 97.6 F (36.4 C) Oral 79 17 95 % - -  02/02/19 2000 130/61 (!) 97.5 F (36.4 C) Oral 79 16 99 % - -  02/02/19 1743 133/77 - - 83 16 98 % - -  02/02/19 1643 (!) 141/62 97.7 F (36.5 C) Axillary 74 12 99 % - -  02/02/19 1542 130/62 97.6 F (36.4 C) Oral 77 12 99 % - -  02/02/19 1500 (!) 121/53 (!) 97.5 F (36.4 C) - 76 11 100 % - -  02/02/19 1400 133/67 98.9 F (37.2 C) - 77 12 98 % - -  02/02/19 1345 (!) 119/59 - - 79 15 98 % - -  02/02/19 1330 (!) 122/57 - - 72 (!) 9 96 % - -  02/02/19 1315 137/61 - - 74 10 97 % - -  02/02/19 1304 (!) 145/73 99.1 F (37.3 C) - 81 13 98 % - -  02/02/19 1016 (!) 104/51 - - (!) 56 20 100 % - -  02/02/19 1002 (!) 102/42 - - 60 14 99 % - -  02/02/19 0957 (!) 108/58 - - 60 16 96 % - -  02/02/19 0951 (!) 101/51 - - (!) 51 17 100 % - -  02/02/19 0942 (!) 101/57 - - (!) 57 13 100 % - -  02/02/19 0936 (!) 104/46 - - (!) 56 16 100 % - -  02/02/19 0931 (!) 97/48 - - (!) 56 15 100 % - -  02/02/19 0923 (!) 122/53 - - 61 11 96 % - -  02/02/19 0917 (!) 101/47 - - (!) 58 10 100 % - -  02/02/19 0912 (!) 116/48 - - 69 12 94 % - -  02/02/19 0907 138/67 - - 70 14 98 % - -  02/02/19 0824 - - - - - - 5\' 2"  (1.575 m) 67.8 kg  02/02/19 0803 (!) 146/62 98.1 F  (36.7 C) Oral 76 14 100 % - -      Intake/Output from previous day:   08/04 0701 - 08/05 0700 In: 3862.7 [P.O.:480; I.V.:2882.7] Out: 1350 [Urine:1250]   Intake/Output this shift:   08/05 0701 - 08/05 1900 In: -  Out: 300 [Urine:300]   Intake/Output      08/04 0701 - 08/05 0700 08/05 0701 - 08/06 0700   P.O. 480    I.V. (mL/kg) 2882.7 (42.5)    IV Piggyback 500    Total  Intake(mL/kg) 3862.7 (57)    Urine (mL/kg/hr) 1250 300 (8.5)   Blood 100    Total Output 1350 300   Net +2512.7 -300           LABORATORY DATA: Recent Labs    02/01/19 1047 02/03/19 0303  WBC 6.8 10.8*  HGB 13.8 12.1  HCT 43.4 37.9  PLT 305 251   Recent Labs    02/01/19 1047 02/03/19 0303  NA 144 138  K 4.2 4.2  CL 104 105  CO2 32 27  BUN 22 15  CREATININE 0.89 0.85  GLUCOSE 92 132*  CALCIUM 9.9 8.5*   Lab Results  Component Value Date   INR 0.9 02/01/2019   INR 1.02 06/27/2009    Recent Radiographic Studies :  Dg Chest 2 View  Result Date: 02/01/2019 CLINICAL DATA:  Preop total knee replacement EXAM: CHEST - 2 VIEW COMPARISON:  February 13, 2015. FINDINGS: The heart size and mediastinal contours are within normal limits. Both lungs are clear. Degenerative changes seen in the midthoracic spine. No acute osseous findings. IMPRESSION: No active cardiopulmonary disease. Electronically Signed   By: Prudencio Pair M.D.   On: 02/01/2019 15:52     Examination:  General appearance: alert, cooperative and no distress  Wound Exam: clean, dry, intact   Drainage:  None: wound tissue dry  Motor Exam: EHL, FHL, Anterior Tibial and Posterior Tibial Intact  Sensory Exam: Superficial Peroneal, Deep Peroneal and Tibial normal  Vascular Exam: Normal  Assessment:    1 Day Post-Op  Procedure(s) (LRB): RIGHT TOTAL KNEE ARTHROPLASTY (Right)  ADDITIONAL DIAGNOSIS:  Active Problems:   S/P total knee arthroplasty, right  No new problems   Plan: Physical Therapy as ordered Partial Weight Bearing  @ 50% (PWB)  DVT Prophylaxis:  Aspirin and TED hose  DISCHARGE PLAN: Home  DISCHARGE NEEDS: HHPT, CPM, Walker and 3-in-1 comode seat   Patient's anticipated LOS is less than 2 midnights, meeting these requirements: - Younger than 40 - Lives within 1 hour of care - Has a competent adult at home to recover with post-op recover - NO history of  - Chronic pain requiring opiods  - Diabetes  - Coronary Artery Disease  - Heart failure  - Heart attack  - Stroke  - DVT/VTE  - Cardiac arrhythmia  - Respiratory Failure/COPD  - Renal failure  - Anemia  - Advanced Liver disease Doing well-dressing changed and right knee wound clean and dry. No calf pain.will plan on discharge today if progresses in PT and no problems later in the morning          Biagio Borg, [place]  02/03/2019 7:32 AM

## 2019-02-03 NOTE — Addendum Note (Signed)
Addendum  created 02/03/19 0935 by Lyn Hollingshead, MD   Child order released for a procedure order, Clinical Note Signed, Intraprocedure Blocks edited, Intraprocedure Meds edited

## 2019-02-10 ENCOUNTER — Telehealth: Payer: Self-pay | Admitting: *Deleted

## 2019-02-10 NOTE — Telephone Encounter (Signed)
Sounds good

## 2019-02-10 NOTE — Telephone Encounter (Signed)
Diana [name] from [facility] called at advise. Patient had PT over the weekend. She will have PT 3 x weekly for 3 weeks. Jerry's phone # [phone] if needed. Will that be sufficient?

## 2019-02-17 ENCOUNTER — Ambulatory Visit (INDEPENDENT_AMBULATORY_CARE_PROVIDER_SITE_OTHER): Payer: Medicare Other

## 2019-02-17 ENCOUNTER — Ambulatory Visit (INDEPENDENT_AMBULATORY_CARE_PROVIDER_SITE_OTHER): Payer: Medicare Other | Admitting: Orthopaedic Surgery

## 2019-02-17 ENCOUNTER — Encounter: Payer: Self-pay | Admitting: Orthopaedic Surgery

## 2019-02-17 ENCOUNTER — Other Ambulatory Visit: Payer: Self-pay

## 2019-02-17 VITALS — Ht 62.0 in | Wt 149.0 lb

## 2019-02-17 DIAGNOSIS — M25561 Pain in right knee: Secondary | ICD-10-CM

## 2019-02-17 DIAGNOSIS — G8929 Other chronic pain: Secondary | ICD-10-CM | POA: Diagnosis not present

## 2019-02-17 DIAGNOSIS — Z96651 Presence of right artificial knee joint: Secondary | ICD-10-CM

## 2019-02-17 NOTE — Progress Notes (Signed)
Office Visit Note   Patient: Diana [name]           Date of Birth: 1951/01/05           MRN: [id number] Visit Date: 02/17/2019              Requested by: Renford Dills, MD [address] [place],  [place] [address] PCP: Renford Dills, MD   Assessment & Plan: Visit Diagnoses:  1. Chronic pain of right knee   2. History of total right knee replacement     Plan: 2 weeks status post primary right total knee replacement doing very well.  Staples removed and Steri-Strips applied.  May weight-bear to tolerance progressing to a single-point cane.  I would like her to receive home health physical therapy for an additional 2 weeks.  Office 2 weeks.  Handicap parking sticker application  Follow-Up Instructions: Return in about 2 weeks (around 03/03/2019).   Orders:  Orders Placed This Encounter  Procedures  . XR KNEE 3 VIEW RIGHT   No orders of the defined types were placed in this encounter.     Procedures: No procedures performed   Clinical Data: No additional findings.   Subjective: Chief Complaint  Patient presents with  . Right Knee - Routine Post Op    Right TKA DOS 02/02/2019  Patient presents today for a follow up on her right knee. She is now two weeks out from a right total knee arthroplasty. Her surgery was on 02/02/2019.  She is doing well. She is walking with a walker due to her left knee buckling. She is taking hydrocodone and methocarbamol. She is doing home therapy.  Very happy with the results no related problems  HPI  Review of Systems   Objective: Vital Signs: Ht 5\' 2"  (1.575 m)   Wt 149 lb (67.6 kg)   BMI 27.25 kg/m   Physical Exam  Ortho Exam right knee incision healing without problem.  Clips removed and Steri-Strips applied.  No instability.  Flexed just about 90 degrees.  No calf pain.  Neurologically intact with full extension  Specialty Comments:  No specialty comments available.  Imaging: No results found.   PMFS History:  Patient Active Problem List   Diagnosis Date Noted  . History of total right knee replacement 02/02/2019  . Rheumatoid arthritis with positive rheumatoid factor  05/14/2016  . High risk medication use 05/14/2016  . Primary osteoarthritis of both hands 05/14/2016  . G6PD deficiency 05/14/2016  . S/p nephrectomy 05/14/2016  . Essential hypertension 05/14/2016  . Depression 05/14/2016  . Sleep apnea 05/14/2016  . Former smoker 05/14/2016  . History of ankle fracture 05/14/2016  . Acute medial meniscus tear of right knee 10/08/2011    Class: Acute  . Osteoarthritis of right knee 10/08/2011    Class: Chronic   Past Medical History:  Diagnosis Date  . Depression   . Hyperlipidemia   . Hypertension   . Kidney disease    "my one kidney doesnt give me problems"   . Positive PPD    works 12/08/2011  . Rheumatoid arthritis(714.0)   . Sleep apnea    uses cpap-mod-severe; i used to but then i got tired of it     History reviewed. No pertinent family history.  Past Surgical History:  Procedure Laterality Date  . ANKLE ARTHRODESIS  2010   left  . BREAST LUMPECTOMY  1998   rt  . BREAST SURGERY  rt breast bx  . CERVICAL BIOPSY    . KIDNEY SURGERY    . NEPHRECTOMY  2007   right  . TOTAL KNEE ARTHROPLASTY Right 02/02/2019   Procedure: RIGHT TOTAL KNEE ARTHROPLASTY;  Surgeon: Garald Balding, MD;  Location: [facility];  Service: Orthopedics;  Laterality: Right;  . TUBAL LIGATION     Social History   Occupational History  . Not on file  Tobacco Use  . Smoking status: Former Smoker    Packs/day: 0.50    Years: 15.00    Pack years: 7.50    Types: Cigarettes    Quit date: 10/04/1995    Years since quitting: 23.3  . Smokeless tobacco: Never Used  Substance and Sexual Activity  . Alcohol use: No  . Drug use: No  . Sexual activity: Not on file

## 2019-02-19 ENCOUNTER — Telehealth: Payer: Self-pay | Admitting: Orthopaedic Surgery

## 2019-02-19 ENCOUNTER — Other Ambulatory Visit: Payer: Self-pay | Admitting: Orthopaedic Surgery

## 2019-02-19 MED ORDER — METHOCARBAMOL 500 MG PO TABS: 500.0000 mg | ORAL_TABLET | Freq: Three times a day (TID) | ORAL | 0 refills | Status: DC | PRN

## 2019-02-19 NOTE — Telephone Encounter (Signed)
Patient left a voicemail stating she just finished her muscle relaxer medication.  Patient is requesting a return call to let her know if Dr. Durward Fortes wants to her to continue taking the medication or if she should take Tylenol.

## 2019-02-19 NOTE — Telephone Encounter (Signed)
Spoke with patient and Dr.Whitfield. She has finished her Robaxin and would like more in case she needs them. I sent in 30 tablets to her pharmacy. She is going to contact her PCP about the dosage of Tylenol her PCP recommends due to having one kidney.

## 2019-02-23 ENCOUNTER — Telehealth: Payer: Self-pay | Admitting: Orthopaedic Surgery

## 2019-02-23 NOTE — Telephone Encounter (Signed)
Please advise 

## 2019-02-23 NOTE — Telephone Encounter (Signed)
Ok to start next week

## 2019-02-23 NOTE — Telephone Encounter (Signed)
Spoke with patient and relayed information below.

## 2019-02-23 NOTE — Telephone Encounter (Signed)
Patient called requesting a return call to let her know if she should resume her medications that she stopped prior to surgery.  Patient states she is due to take her Humira injection today.

## 2019-03-02 ENCOUNTER — Other Ambulatory Visit: Payer: Self-pay

## 2019-03-02 ENCOUNTER — Encounter: Payer: Self-pay | Admitting: Orthopaedic Surgery

## 2019-03-02 ENCOUNTER — Ambulatory Visit (INDEPENDENT_AMBULATORY_CARE_PROVIDER_SITE_OTHER): Payer: Medicare Other | Admitting: Orthopaedic Surgery

## 2019-03-02 VITALS — BP 142/63 | HR 77 | Ht 62.0 in | Wt 149.0 lb

## 2019-03-02 DIAGNOSIS — Z96651 Presence of right artificial knee joint: Secondary | ICD-10-CM

## 2019-03-02 DIAGNOSIS — M1711 Unilateral primary osteoarthritis, right knee: Secondary | ICD-10-CM

## 2019-03-02 MED ORDER — TRAMADOL HCL 50 MG PO TABS: ORAL_TABLET | ORAL | 0 refills | Status: DC

## 2019-03-02 NOTE — Progress Notes (Signed)
Office Visit Note   Patient: Diana [name]           Date of Birth: 13-Jun-1951           MRN: [id number] Visit Date: 03/02/2019              Requested by: Seward Carol, MD [address] [place],   [address] PCP: Seward Carol, MD   Assessment & Plan: Visit Diagnoses:  1. Primary osteoarthritis of right knee   2. History of total right knee replacement     Plan: 4 weeks status post right total knee replacement and doing well.  Uses a cane for ambulation.  No related fever or chills.  Working on exercises.  Will prescribe tramadol for pain and inform her of potential side effects as she is on Prozac.  Office 2 months.  Weightbearing as tolerated.  Follow-Up Instructions: Return in about 2 months (around 05/02/2019).   Orders:  No orders of the defined types were placed in this encounter.  Meds ordered this encounter  Medications  . traMADol (ULTRAM) 50 MG tablet    Sig: 1-2 PO BID prn pain    Dispense:  30 tablet    Refill:  0      Procedures: No procedures performed   Clinical Data: No additional findings.   Subjective: Chief Complaint  Patient presents with  . Right Knee - Routine Post Op    Right TKA DOS 02/02/2019  Patient presents today for follow up on her right knee. She had a right total knee arthroplasty on 02/02/2019. She is now 4 weeks out from surgery. Patient states that she is doing well. She ran out of hydrocodone this morning, and would like to discuss something not as strong. She finishes home therapy this week.   HPI  Review of Systems   Objective: Vital Signs: BP (!) 142/63   Pulse 77   Ht 5\' 2"  (1.575 m)   Wt 149 lb (67.6 kg)   BMI 27.25 kg/m   Physical Exam  Ortho Exam right knee incision healing without problem.  No redness or drainage.  No opening with varus valgus stress.  Full extension and flexes over 100 degrees.  No instability.  No distal edema.  No calf pain.  Neurologically intact  Specialty Comments:   No specialty comments available.  Imaging: No results found.   PMFS History: Patient Active Problem List   Diagnosis Date Noted  . History of total right knee replacement 02/02/2019  . Rheumatoid arthritis with positive rheumatoid factor  05/14/2016  . High risk medication use 05/14/2016  . Primary osteoarthritis of both hands 05/14/2016  . G6PD deficiency 05/14/2016  . S/p nephrectomy 05/14/2016  . Essential hypertension 05/14/2016  . Depression 05/14/2016  . Sleep apnea 05/14/2016  . Former smoker 05/14/2016  . History of ankle fracture 05/14/2016  . Acute medial meniscus tear of right knee 10/08/2011    Class: Acute  . Osteoarthritis of right knee 10/08/2011    Class: Chronic   Past Medical History:  Diagnosis Date  . Depression   . Hyperlipidemia   . Hypertension   . Kidney disease    "my one kidney doesnt give me problems"   . 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 Rheumatoid arthritis(714.0)   . Sleep apnea    uses cpap-mod-severe; i used to but then i got tired of it     History reviewed. No pertinent family history.  Past  Surgical History:  Procedure Laterality Date  . ANKLE ARTHRODESIS  2010   left  . BREAST LUMPECTOMY  1998   rt  . BREAST SURGERY     rt breast bx  . CERVICAL BIOPSY    . KIDNEY SURGERY    . NEPHRECTOMY  2007   right  . TOTAL KNEE ARTHROPLASTY Right 02/02/2019   Procedure: RIGHT TOTAL KNEE ARTHROPLASTY;  Surgeon: Valeria Batman, MD;  Location: [facility];  Service: Orthopedics;  Laterality: Right;  . TUBAL LIGATION     Social History   Occupational History  . Not on file  Tobacco Use  . Smoking status: Former Smoker    Packs/day: 0.50    Years: 15.00    Pack years: 7.50    Types: Cigarettes    Quit date: 10/04/1995    Years since quitting: 23.4  . Smokeless tobacco: Never Used  Substance and Sexual Activity  . Alcohol use: No  . Drug use: No  . Sexual activity: Not on file

## 2019-03-03 ENCOUNTER — Ambulatory Visit: Payer: Medicare Other | Admitting: Orthopaedic Surgery

## 2019-04-14 ENCOUNTER — Encounter: Payer: Self-pay | Admitting: Orthopaedic Surgery

## 2019-04-14 ENCOUNTER — Other Ambulatory Visit: Payer: Self-pay

## 2019-04-14 ENCOUNTER — Ambulatory Visit (INDEPENDENT_AMBULATORY_CARE_PROVIDER_SITE_OTHER): Payer: Medicare Other | Admitting: Orthopaedic Surgery

## 2019-04-14 VITALS — BP 147/70 | HR 74 | Ht 62.0 in | Wt 149.0 lb

## 2019-04-14 DIAGNOSIS — Z96651 Presence of right artificial knee joint: Secondary | ICD-10-CM

## 2019-04-14 DIAGNOSIS — M1711 Unilateral primary osteoarthritis, right knee: Secondary | ICD-10-CM

## 2019-04-14 NOTE — Progress Notes (Signed)
Office Visit Note   Patient: Diana [name]           Date of Birth: 07-11-1950           MRN: [id number] Visit Date: 04/14/2019              Requested by: Renford Dills, MD [address] [place],  [place] [address] PCP: Renford Dills, MD   Assessment & Plan: Visit Diagnoses:  1. Primary osteoarthritis of right knee   2. History of total right knee replacement     Plan: 2 months status post primary right total knee replacement and doing very well.  [name] would like to proceed with a left knee replacement sometime in November.  I do not think she will need further clearance as the one she had for the right knee replacement should still be in effect and current.  He is doing very very well and happy with her results.  Urged to continue with strengthening exercises to both lower extremities  Follow-Up Instructions: Return We will schedule left total knee replacement.   Orders:  No orders of the defined types were placed in this encounter.  No orders of the defined types were placed in this encounter.     Procedures: No procedures performed   Clinical Data: No additional findings.   Subjective: Chief Complaint  Patient presents with  . Right Knee - Routine Post Op    Right TKA 02/02/2019  Patient presents today for follow up on her right knee. She is now two months out from right total knee arthroplasty. Her surgery was on 02/02/2019. Patient states that she is doing well. Her leg swells as the day progresses. She is done with therapy. She does not take anything for pain.  Will occasionally carry a cane "just in case". I reviewed prior films of her left knee with moderate osteoarthritic changes.  Normal alignment.  HPI  Review of Systems   Objective: Vital Signs: BP (!) 147/70   Pulse 74   Ht 5\' 2"  (1.575 m)   Wt 149 lb (67.6 kg)   BMI 27.25 kg/m   Physical Exam Constitutional:      Appearance: She is well-developed.  Eyes:     Pupils: Pupils  are equal, round, and reactive to light.  Pulmonary:     Effort: Pulmonary effort is normal.  Skin:    General: Skin is warm and dry.  Neurological:     Mental Status: She is alert and oriented to person, place, and time.  Psychiatric:        Behavior: Behavior normal.     Ortho Exam right knee with early keloid formation but without pain.  Minimal hypertrophy of the knee.  No effusion.  Full quick extension of flexion release to 120 degrees without instability.  Mild ankle swelling that is nonpitting.  No calf pain neurologically intact.  Left knee with full extension of flexed about 120 degrees without instability.  Mostly medial joint pain but some patellar crepitation.  No calf pain or ankle swelling  Specialty Comments:  No specialty comments available.  Imaging: No results found.   PMFS History: Patient Active Problem List   Diagnosis Date Noted  . History of total right knee replacement 02/02/2019  . Rheumatoid arthritis with positive rheumatoid factor  05/14/2016  . High risk medication use 05/14/2016  . Primary osteoarthritis of both hands 05/14/2016  . G6PD deficiency 05/14/2016  . S/p nephrectomy 05/14/2016  . Essential  hypertension 05/14/2016  . Depression 05/14/2016  . Sleep apnea 05/14/2016  . Former smoker 05/14/2016  . History of ankle fracture 05/14/2016  . Acute medial meniscus tear of right knee 10/08/2011    Class: Acute  . Osteoarthritis of right knee 10/08/2011    Class: Chronic   Past Medical History:  Diagnosis Date  . Depression   . Hyperlipidemia   . Hypertension   . Kidney disease    "my one kidney doesnt give me problems"   . 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 Rheumatoid arthritis(714.0)   . Sleep apnea    uses cpap-mod-severe; i used to but then i got tired of it     History reviewed. No pertinent family history.  Past Surgical History:  Procedure Laterality Date  . ANKLE ARTHRODESIS  2010   left  . BREAST LUMPECTOMY  1998    rt  . BREAST SURGERY     rt breast bx  . CERVICAL BIOPSY    . KIDNEY SURGERY    . NEPHRECTOMY  2007   right  . TOTAL KNEE ARTHROPLASTY Right 02/02/2019   Procedure: RIGHT TOTAL KNEE ARTHROPLASTY;  Surgeon: Garald Balding, MD;  Location: [facility];  Service: Orthopedics;  Laterality: Right;  . TUBAL LIGATION     Social History   Occupational History  . Not on file  Tobacco Use  . Smoking status: Former Smoker    Packs/day: 0.50    Years: 15.00    Pack years: 7.50    Types: Cigarettes    Quit date: 10/04/1995    Years since quitting: 23.5  . Smokeless tobacco: Never Used  Substance and Sexual Activity  . Alcohol use: No  . Drug use: No  . Sexual activity: Not on file

## 2019-04-19 ENCOUNTER — Other Ambulatory Visit: Payer: Self-pay | Admitting: Obstetrics and Gynecology

## 2019-04-19 DIAGNOSIS — Z1231 Encounter for screening mammogram for malignant neoplasm of breast: Secondary | ICD-10-CM

## 2019-04-27 NOTE — Progress Notes (Signed)
Office Visit Note  Patient: Diana [name]             Date of Birth: 1950/11/27           MRN: [id number]             PCP: Seward Carol, MD Referring: Seward Carol, MD Visit Date: 05/11/2019 Occupation: @GUAROCC @  Subjective:  Left knee pain   History of Present Illness: Diana [name] is a 68 y.o. female with history of seropositive rheumatoid arthritis and osteoarthritis.  She is on Humira 40 mg subcutaneous injections every 14 days.  She has not had any recent rheumatoid arthritis flares.  She had her right knee replaced by Dr. Durward Fortes on 02/02/2019, which is doing great.  She is scheduled for left knee arthroplasty on 06/08/2019.  She is aware that she needs to hold Humira prior to surgery and be cleared prior to restarting on Humira after surgery.  She states that the only joint that is bothering her currently is the left knee joint.  She denies any other joint pain or joint swelling at this time.  She states that she is no longer taking narcotics.   Activities of Daily Living:  Patient reports morning stiffness for 2 minutes.   Patient Denies nocturnal pain.  Difficulty dressing/grooming: Denies Difficulty climbing stairs: Denies Difficulty getting out of chair: Denies Difficulty using hands for taps, buttons, cutlery, and/or writing: Denies  Review of Systems  Constitutional: Negative for fatigue.  HENT: Negative for mouth sores, mouth dryness and nose dryness.   Eyes: Negative for pain, visual disturbance and dryness.  Respiratory: Negative for cough, hemoptysis, shortness of breath and difficulty breathing.   Cardiovascular: Negative for chest pain, palpitations, hypertension and swelling in legs/feet.  Gastrointestinal: Negative for blood in stool, constipation and diarrhea.  Endocrine: Negative for excessive thirst and increased urination.  Genitourinary: Negative for difficulty urinating and painful urination.  Musculoskeletal: Positive for arthralgias, joint pain and  morning stiffness. Negative for joint swelling, myalgias, muscle weakness, muscle tenderness and myalgias.  Skin: Negative for color change, pallor, rash, hair loss, nodules/bumps, skin tightness, ulcers and sensitivity to sunlight.  Allergic/Immunologic: Negative for susceptible to infections.  Neurological: Negative for dizziness, numbness, headaches and weakness.  Hematological: Negative for bruising/bleeding tendency and swollen glands.  Psychiatric/Behavioral: Negative for depressed mood and sleep disturbance. The patient is not nervous/anxious.     PMFS History:  Patient Active Problem List   Diagnosis Date Noted   History of total right knee replacement 02/02/2019   Rheumatoid arthritis with positive rheumatoid factor  05/14/2016   High risk medication use 05/14/2016   Primary osteoarthritis of both hands 05/14/2016   G6PD deficiency 05/14/2016   S/p nephrectomy 05/14/2016   Essential hypertension 05/14/2016   Depression 05/14/2016   Sleep apnea 05/14/2016   Former smoker 05/14/2016   History of ankle fracture 05/14/2016   Acute medial meniscus tear of right knee 10/08/2011    Class: Acute   Osteoarthritis of right knee 10/08/2011    Class: Chronic    Past Medical History:  Diagnosis Date   Depression    Hyperlipidemia    Hypertension    Kidney disease    "my one kidney doesnt give me problems"    Positive PPD    works health dept   Rheumatoid arthritis(714.0)    Sleep apnea    uses cpap-mod-severe; i used to but then i got tired of it     History reviewed. No pertinent family history.  Past Surgical History:  Procedure Laterality Date   ANKLE ARTHRODESIS  2010   left   BREAST LUMPECTOMY  1998   rt   BREAST SURGERY     rt breast bx   CERVICAL BIOPSY     KIDNEY SURGERY     KNEE ARTHROPLASTY     NEPHRECTOMY  2007   right   TOTAL KNEE ARTHROPLASTY Right 02/02/2019   Procedure: RIGHT TOTAL KNEE ARTHROPLASTY;  Surgeon: Valeria Batman, MD;  Location: [facility];  Service: Orthopedics;  Laterality: Right;   TUBAL LIGATION     Social History   Social History Narrative   Not on file   Immunization History  Administered Date(s) Administered   Influenza, High Dose Seasonal PF 04/10/2018   Pneumococcal Polysaccharide-23 04/10/2018     Objective: Vital Signs: BP 112/63 (BP Location: Left Arm, Patient Position: Sitting, Cuff Size: Normal)    Pulse 73    Resp 16    Ht 5\' 2"  (1.575 m)    Wt 151 lb 3.2 oz (68.6 kg)    BMI 27.65 kg/m    Physical Exam Vitals signs and nursing note reviewed.  Constitutional:      Appearance: She is well-developed.  HENT:     Head: Normocephalic and atraumatic.  Eyes:     Conjunctiva/sclera: Conjunctivae normal.  Neck:     Musculoskeletal: Normal range of motion.  Cardiovascular:     Rate and Rhythm: Normal rate and regular rhythm.     Heart sounds: Normal heart sounds.  Pulmonary:     Effort: Pulmonary effort is normal.     Breath sounds: Normal breath sounds.  Abdominal:     General: Bowel sounds are normal.     Palpations: Abdomen is soft.  Lymphadenopathy:     Cervical: No cervical adenopathy.  Skin:    General: Skin is warm and dry.     Capillary Refill: Capillary refill takes less than 2 seconds.  Neurological:     Mental Status: She is alert and oriented to person, place, and time.  Psychiatric:        Behavior: Behavior normal.      Musculoskeletal Exam: C-spine, thoracic spine, lumbar spine good range of motion.  No midline spinal tenderness.  Shoulder joints, elbow joints, wrist joints, MCPs, PIPs and DIPs good range of motion no synovitis.  She has complete fist formation bilaterally.  She has synovial thickening of the right first MCP joint.  Hip joints have good range of motion with no discomfort.  Right knee replacement has slightly limited extension with warmth.  Left knee has good range of motion with no warmth or effusion.  No tenderness or swelling of ankle  joints.  No tenderness of MTP joints.  CDAI Exam: CDAI Score: 1  Patient Global: 5 mm; Provider Global: 5 mm Swollen: 0 ; Tender: 0  Joint Exam   No joint exam has been documented for this visit   There is currently no information documented on the homunculus. Go to the Rheumatology activity and complete the homunculus joint exam.  Investigation: No additional findings.  Imaging: No results found.  Recent Labs: Lab Results  Component Value Date   WBC 10.8 (H) 02/03/2019   HGB 12.1 02/03/2019   PLT 251 02/03/2019   NA 138 02/03/2019   K 4.2 02/03/2019   CL 105 02/03/2019   CO2 27 02/03/2019   GLUCOSE 132 (H) 02/03/2019   BUN 15 02/03/2019   CREATININE 0.85 02/03/2019   BILITOT  0.8 02/01/2019   ALKPHOS 80 02/01/2019   AST 23 02/01/2019   ALT 18 02/01/2019   PROT 8.1 02/01/2019   ALBUMIN 4.0 02/01/2019   CALCIUM 8.5 (L) 02/03/2019   GFRAA >60 02/03/2019   QFTBGOLD NEGATIVE 05/07/2017   QFTBGOLDPLUS NEGATIVE 12/09/2018    Speciality Comments: No specialty comments available.  Procedures:  No procedures performed Allergies: Other, Iodine, Povidone iodine, and Latex   Assessment / Plan:     Visit Diagnoses: Rheumatoid arthritis with positive rheumatoid factor: She has no synovitis on exam.  She has not had any recent rheumatoid arthritis flares.  She is clinically doing well on Humira 40 mg subcutaneous injections every 14 days.  She has chronic left knee joint pain and is scheduled for left knee arthroplasty performed by Dr. Cleophas Dunker on 06/08/2019.  She had her right knee replaced by Dr. Cleophas Dunker on 02/02/2019 and it is doing well.  She has no other joint pain or joint swelling at this time.  She will continue on Humira 40 mg subcutaneous injections every 14 days.  She is aware that she should hold Humira 2 weeks prior to surgery and be cleared by Dr. Cleophas Dunker prior to restarting on Humira after surgery.  She is advised to notify us if she develops increased joint pain or  joint swelling.  She'll follow-up in the office in 5 months.  High risk medication use - Humira 40 mg sq injection every 14 days.  BMP and CBC were drawn on 02/03/2019.  She has an appointment with her PCP on 05/21/2019 and would like to have lab work drawn at that visit.  TB gold was negative on 12/09/2018.  Primary osteoarthritis of both hands: She has mild PIP and DIP synovial thickening consistent with osteoarthritis of both hands.  She has no tenderness or synovitis on exam.  She has complete fist formation bilaterally.  She has no discomfort at this time.  Joint protection and muscle strengthening were discussed.  S/P total knee replacement, right -performed by Dr. Cleophas Dunker on 02/02/2019.  Doing well.  She was slightly limited extension and warmth but no effusion was noted.  She has no discomfort at this time.  Primary osteoarthritis of left knee: Chronic pain.  She has good range of motion with no warmth or effusion.  She is scheduled for a left knee arthroplasty performed by Dr. Cleophas Dunker on 06/08/2019.  Other medical conditions are listed as follows:  G6PD deficiency  History of hypertension  History of depression  History of sleep apnea  History of nephrectomy  History of ankle fracture  Ex-smoker  Orders: No orders of the defined types were placed in this encounter.  No orders of the defined types were placed in this encounter.    Follow-Up Instructions: Return in about 5 months (around 10/09/2019) for Rheumatoid arthritis, Osteoarthritis.   Gearldine Bienenstock, PA-C  Note - This record has been created using Dragon software.  Chart creation errors have been sought, but may not always  have been located. Such creation errors do not reflect on  the standard of medical care.

## 2019-04-28 ENCOUNTER — Other Ambulatory Visit: Payer: Self-pay

## 2019-05-11 ENCOUNTER — Encounter: Payer: Self-pay | Admitting: Physician Assistant

## 2019-05-11 ENCOUNTER — Other Ambulatory Visit: Payer: Self-pay

## 2019-05-11 ENCOUNTER — Telehealth: Payer: Self-pay | Admitting: Pharmacist

## 2019-05-11 ENCOUNTER — Ambulatory Visit (INDEPENDENT_AMBULATORY_CARE_PROVIDER_SITE_OTHER): Payer: Medicare Other | Admitting: Physician Assistant

## 2019-05-11 VITALS — BP 112/63 | HR 73 | Resp 16 | Ht 62.0 in | Wt 151.2 lb

## 2019-05-11 DIAGNOSIS — M0579 Rheumatoid arthritis with rheumatoid factor of multiple sites without organ or systems involvement: Secondary | ICD-10-CM | POA: Diagnosis not present

## 2019-05-11 DIAGNOSIS — Z8781 Personal history of (healed) traumatic fracture: Secondary | ICD-10-CM

## 2019-05-11 DIAGNOSIS — Z8679 Personal history of other diseases of the circulatory system: Secondary | ICD-10-CM

## 2019-05-11 DIAGNOSIS — Z96651 Presence of right artificial knee joint: Secondary | ICD-10-CM

## 2019-05-11 DIAGNOSIS — M19041 Primary osteoarthritis, right hand: Secondary | ICD-10-CM | POA: Diagnosis not present

## 2019-05-11 DIAGNOSIS — Z79899 Other long term (current) drug therapy: Secondary | ICD-10-CM

## 2019-05-11 DIAGNOSIS — Z905 Acquired absence of kidney: Secondary | ICD-10-CM

## 2019-05-11 DIAGNOSIS — M19042 Primary osteoarthritis, left hand: Secondary | ICD-10-CM

## 2019-05-11 DIAGNOSIS — D75A Glucose-6-phosphate dehydrogenase (G6PD) deficiency without anemia: Secondary | ICD-10-CM

## 2019-05-11 DIAGNOSIS — M1712 Unilateral primary osteoarthritis, left knee: Secondary | ICD-10-CM

## 2019-05-11 DIAGNOSIS — Z87891 Personal history of nicotine dependence: Secondary | ICD-10-CM

## 2019-05-11 DIAGNOSIS — Z8669 Personal history of other diseases of the nervous system and sense organs: Secondary | ICD-10-CM

## 2019-05-11 DIAGNOSIS — Z8659 Personal history of other mental and behavioral disorders: Secondary | ICD-10-CM

## 2019-05-11 NOTE — Patient Instructions (Signed)
Standing Labs We placed an order today for your standing lab work.    Please come back and get your standing labs in November and every 3 months   CBC and CMP   We have open lab daily Monday through Thursday from 8:30-12:30 PM and 1:30-4:30 PM and Friday from 8:30-12:30 PM and 1:30-4:00 PM at the office of Dr. Bo Merino.   You may experience shorter wait times on Monday and Friday afternoons. The office is located at [address] [place], [place], [place] [address] No appointment is necessary.   Labs are drawn by [facility].  You may receive a bill from [facility] for your lab work.  If you wish to have your labs drawn at another location, please call the office 24 hours in advance to send orders.  If you have any questions regarding directions or hours of operation,  please call [phone].   Just as a reminder please drink plenty of water prior to coming for your lab work. Thanks!

## 2019-05-11 NOTE — Telephone Encounter (Signed)
Patient brought Humira renewal application to the office along with her income documents.  Willfax once provider portion is completed.   Mariella Saa, PharmD, [place], [place] [facility] Pharmacist [phone]  05/11/2019 10:23 AM

## 2019-05-20 NOTE — Telephone Encounter (Signed)
Faxed completed application to [facility].  Will update when we hear a response.   Mariella Saa, PharmD, [place], [place] Clinical Specialty Pharmacist [phone]  05/20/2019 9:08 AM

## 2019-05-24 NOTE — H&P (Signed)
TOTAL KNEE ADMISSION H&P  Patient is being admitted for left total knee arthroplasty.  Subjective:  Chief Complaint:left knee pain.  HPI: Diana [name], 68 y.o. female, has a history of pain and functional disability in the left knee due to arthritis and has failed non-surgical conservative treatments for greater than 12 weeks to includecorticosteriod injections, flexibility and strengthening excercises, use of assistive devices, weight reduction as appropriate and activity modification.  Onset of symptoms was gradual, starting 7 years ago with gradually worsening course since that time. The patient noted no past surgery on the left knee(s).  Patient currently rates pain in the left knee(s) at 7 out of 10 with activity. Patient has night pain, worsening of pain with activity and weight bearing, pain that interferes with activities of daily living, pain with passive range of motion, crepitus and joint swelling.  Patient has evidence of subchondral sclerosis, periarticular osteophytes, joint subluxation and joint space narrowing by imaging studies.. There is no active infection.  Patient Active Problem List   Diagnosis Date Noted  . History of total right knee replacement 02/02/2019  . Rheumatoid arthritis with positive rheumatoid factor  05/14/2016  . High risk medication use 05/14/2016  . Primary osteoarthritis of both hands 05/14/2016  . G6PD deficiency 05/14/2016  . S/p nephrectomy 05/14/2016  . Essential hypertension 05/14/2016  . Depression 05/14/2016  . Sleep apnea 05/14/2016  . Former smoker 05/14/2016  . History of ankle fracture 05/14/2016  . Acute medial meniscus tear of right knee 10/08/2011    Class: Acute  . Osteoarthritis of right knee 10/08/2011    Class: Chronic   Past Medical History:  Diagnosis Date  . Depression   . Hyperlipidemia   . Hypertension   . Kidney disease    "my one kidney doesnt give me problems"   . 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 Rheumatoid  arthritis(714.0)   . Sleep apnea    uses cpap-mod-severe; i used to but then i got tired of it     Past Surgical History:  Procedure Laterality Date  . ANKLE ARTHRODESIS  2010   left  . BREAST LUMPECTOMY  1998   rt  . BREAST SURGERY     rt breast bx  . CERVICAL BIOPSY    . KIDNEY SURGERY    . KNEE ARTHROPLASTY    . NEPHRECTOMY  2007   right  . TOTAL KNEE ARTHROPLASTY Right 02/02/2019   Procedure: RIGHT TOTAL KNEE ARTHROPLASTY;  Surgeon: Garald Balding, MD;  Location: [facility];  Service: Orthopedics;  Laterality: Right;  . TUBAL LIGATION      No current facility-administered medications for this encounter.    Current Outpatient Medications  Medication Sig Dispense Refill Last Dose  . FLUoxetine (PROZAC) 10 MG capsule Take 10 mg by mouth daily.   Taking  . fluticasone (FLONASE) 50 MCG/ACT nasal spray Place 2 sprays into both nostrils daily as needed for allergies or rhinitis.    Taking  . Multiple Vitamins-Minerals (CENTRUM SILVER PO) Take 1 tablet by mouth daily.   Taking  . Omega-3 Fatty Acids (FISH OIL) 500 MG CAPS Take 500 mg by mouth daily.    Taking  . telmisartan-hydrochlorothiazide (MICARDIS HCT) 80-25 MG tablet Take 1 tablet by mouth daily.    Taking  . vitamin C (ASCORBIC ACID) 500 MG tablet Take 500 mg by mouth daily.    Taking  . VITAMIN D PO Take 2,000 Units by mouth daily.    Taking  Allergies  Allergen Reactions  . Other     NO BLOOD PRODUCTS   . Iodine     No shellfish allergy per patient   . Povidone Iodine   . Latex Rash    Social History   Tobacco Use  . Smoking status: Former Smoker    Packs/day: 0.50    Years: 15.00    Pack years: 7.50    Types: Cigarettes    Quit date: 10/04/1995    Years since quitting: 23.6  . Smokeless tobacco: Never Used  Substance Use Topics  . Alcohol use: No    No family history on file.   Constitutional: Negative for fatigue.  HENT: Negative for mouth sores, mouth dryness and nose dryness.   Eyes: Negative for  pain, visual disturbance and dryness.  Respiratory: Negative for cough, hemoptysis, shortness of breath and difficulty breathing.   Cardiovascular: Negative for chest pain, palpitations, hypertension and swelling in legs/feet.  Gastrointestinal: Negative for blood in stool, constipation and diarrhea.  Endocrine: Negative for excessive thirst and increased urination.  Genitourinary: Negative for difficulty urinating and painful urination.  Musculoskeletal: Positive for arthralgias, joint pain and morning stiffness. Negative for joint swelling, myalgias, muscle weakness, muscle tenderness and myalgias.  Skin: Negative for color change, pallor, rash, hair loss, nodules/bumps, skin tightness, ulcers and sensitivity to sunlight.  Allergic/Immunologic: Negative for susceptible to infections.  Neurological: Negative for dizziness, numbness, headaches and weakness.  Hematological: Negative for bruising/bleeding tendency and swollen glands.  Psychiatric/Behavioral: Negative for depressed mood and sleep disturbance. The patient is not nervous/anxious.   Musculoskeletal:  ROM 3-100 degrees.  Mild effusion without warmth or erythema.   Objective:  Physical Exam  Constitutional: She is oriented to person, place, and time. She appears well-developed and well-nourished.  HENT:  Head: Normocephalic and atraumatic.  Eyes: Pupils are equal, round, and reactive to light. Conjunctivae and EOM are normal.  Neck: Normal range of motion. Neck supple. No thyromegaly present.  Cardiovascular: Normal rate, regular rhythm, normal heart sounds and intact distal pulses.  No murmur heard. Respiratory: Effort normal and breath sounds normal.  GI: Soft. Bowel sounds are normal. There is no abdominal tenderness.  Neurological: She is alert and oriented to person, place, and time.  Skin: Skin is warm and dry.  Psychiatric: She has a normal mood and affect. Her behavior is normal. Judgment and thought content normal.     Vital signs in last 24 hours:  BP 112/60, Pulse 78, Resp 16  Ht 5\' 2"  (1.575 m)  Wt 68.6 kg  BMI 27.65 kg/m  BSA 1.73 m  Labs:   Estimated body mass index is 27.65 kg/m as calculated from the following:   Height as of 05/11/19: 5\' 2"  (1.575 m).   Weight as of 05/11/19: 68.6 kg.   Imaging Review Plain radiographs demonstrate moderate degenerative joint disease of the left knee(s). The overall alignment isneutral. The bone quality appears to be good for age and reported activity level.      Assessment/Plan:  End stage arthritis, left knee   The patient history, physical examination, clinical judgment of the provider and imaging studies are consistent with end stage degenerative joint disease of the left knee(s) and total knee arthroplasty is deemed medically necessary. The treatment options including medical management, injection therapy arthroscopy and arthroplasty were discussed at length. The risks and benefits of total knee arthroplasty were presented and reviewed. The risks due to aseptic loosening, infection, stiffness, patella tracking problems, thromboembolic complications and other imponderables  were discussed. The patient acknowledged the explanation, agreed to proceed with the plan and consent was signed. Patient is being admitted for inpatient treatment for surgery, pain control, PT, OT, prophylactic antibiotics, VTE prophylaxis, progressive ambulation and ADL's and discharge planning. The patient is planning to be discharged home with home health services     Patient's anticipated LOS is less than 2 midnights, meeting these requirements: - Younger than 70 - Lives within 1 hour of care - Has a competent adult at home to recover with post-op recover - NO history of  - Chronic pain requiring opiods  - Diabetes  - Coronary Artery Disease  - Heart failure  - Heart attack  - Stroke  - DVT/VTE  - Cardiac arrhythmia  - Respiratory Failure/COPD  - Renal  failure  - Anemia  - Advanced Liver disease

## 2019-05-25 ENCOUNTER — Ambulatory Visit (INDEPENDENT_AMBULATORY_CARE_PROVIDER_SITE_OTHER): Payer: Medicare Other | Admitting: Orthopedic Surgery

## 2019-05-25 ENCOUNTER — Encounter: Payer: Self-pay | Admitting: Orthopedic Surgery

## 2019-05-25 ENCOUNTER — Other Ambulatory Visit: Payer: Self-pay

## 2019-05-25 DIAGNOSIS — M1712 Unilateral primary osteoarthritis, left knee: Secondary | ICD-10-CM | POA: Diagnosis not present

## 2019-05-25 NOTE — Progress Notes (Signed)
Office Visit Note   Patient: Diana [name]           Date of Birth: 16-Jul-1950           MRN: [id number] Visit Date: 05/25/2019              Requested by: Seward Carol, MD [address] [place],  [place] [address] PCP: Seward Carol, MD   Chief Complaint:left knee pain.  HPI: Diana [name], 68 y.o. female, has a history of pain and functional disability in the left knee due to arthritis and has failed non-surgical conservative treatments for greater than 12 weeks to includecorticosteriod injections, flexibility and strengthening excercises, use of assistive devices, weight reduction as appropriate and activity modification.  Onset of symptoms was gradual, starting 7 years ago with gradually worsening course since that time. The patient noted no past surgery on the left knee(s).  Patient currently rates pain in the left knee(s) at 7 out of 10 with activity. Patient has night pain, worsening of pain with activity and weight bearing, pain that interferes with activities of daily living, pain with passive range of motion, crepitus and joint swelling.  Patient has evidence of subchondral sclerosis, periarticular osteophytes, joint subluxation and joint space narrowing by imaging studies.. There is no active infection.  Patient Active Problem List   Diagnosis Date Noted  . History of total right knee replacement 02/02/2019  . Rheumatoid arthritis with positive rheumatoid factor  05/14/2016  . High risk medication use 05/14/2016  . Primary osteoarthritis of both hands 05/14/2016  . G6PD deficiency 05/14/2016  . S/p nephrectomy 05/14/2016  . Essential hypertension 05/14/2016  . Depression 05/14/2016  . Sleep apnea 05/14/2016  . Former smoker 05/14/2016  . History of ankle fracture 05/14/2016  . Acute medial meniscus tear of right knee 10/08/2011    Class: Acute  . Osteoarthritis of right knee 10/08/2011    Class: Chronic   Past Medical History:  Diagnosis Date  .  Depression   . Hyperlipidemia   . Hypertension   . Kidney disease    "my one kidney doesnt give me problems"   . 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 Rheumatoid arthritis(714.0)   . Sleep apnea    uses cpap-mod-severe; i used to but then i got tired of it     Past Surgical History:  Procedure Laterality Date  . ANKLE ARTHRODESIS  2010   left  . BREAST LUMPECTOMY  1998   rt  . BREAST SURGERY     rt breast bx  . CERVICAL BIOPSY    . KIDNEY SURGERY    . KNEE ARTHROPLASTY    . NEPHRECTOMY  2007   right  . TOTAL KNEE ARTHROPLASTY Right 02/02/2019   Procedure: RIGHT TOTAL KNEE ARTHROPLASTY;  Surgeon: Garald Balding, MD;  Location: [facility];  Service: Orthopedics;  Laterality: Right;  . TUBAL LIGATION      No current facility-administered medications for this encounter.    Current Outpatient Medications  Medication Sig Dispense Refill Last Dose  . FLUoxetine (PROZAC) 10 MG capsule Take 10 mg by mouth daily.   Taking  . fluticasone (FLONASE) 50 MCG/ACT nasal spray Place 2 sprays into both nostrils daily as needed for allergies or rhinitis.    Taking  . Multiple Vitamins-Minerals (CENTRUM SILVER PO) Take 1 tablet by mouth daily.   Taking  . Omega-3 Fatty Acids (FISH OIL) 500 MG CAPS Take 500 mg by mouth daily.  Taking  . telmisartan-hydrochlorothiazide (MICARDIS HCT) 80-25 MG tablet Take 1 tablet by mouth daily.    Taking  . vitamin C (ASCORBIC ACID) 500 MG tablet Take 500 mg by mouth daily.    Taking  . VITAMIN D PO Take 2,000 Units by mouth daily.    Taking   Allergies  Allergen Reactions  . Other     NO BLOOD PRODUCTS   . Iodine     No shellfish allergy per patient   . Povidone Iodine   . Latex Rash    Social History   Tobacco Use  . Smoking status: Former Smoker    Packs/day: 0.50    Years: 15.00    Pack years: 7.50    Types: Cigarettes    Quit date: 10/04/1995    Years since quitting: 23.6  . Smokeless tobacco: Never Used  Substance Use Topics  . Alcohol use:  No    No family history on file.   Constitutional: Negative for fatigue.  HENT: Negative for mouth sores, mouth dryness and nose dryness.   Eyes: Negative for pain, visual disturbance and dryness.  Respiratory: Negative for cough, hemoptysis, shortness of breath and difficulty breathing.   Cardiovascular: Negative for chest pain, palpitations, hypertension and swelling in legs/feet.  Gastrointestinal: Negative for blood in stool, constipation and diarrhea.  Endocrine: Negative for excessive thirst and increased urination.  Genitourinary: Negative for difficulty urinating and painful urination.  Musculoskeletal: Positive for arthralgias, joint pain and morning stiffness. Negative for joint swelling, myalgias, muscle weakness, muscle tenderness and myalgias.  Skin: Negative for color change, pallor, rash, hair loss, nodules/bumps, skin tightness, ulcers and sensitivity to sunlight.  Allergic/Immunologic: Negative for susceptible to infections.  Neurological: Negative for dizziness, numbness, headaches and weakness.  Hematological: Negative for bruising/bleeding tendency and swollen glands.  Psychiatric/Behavioral: Negative for depressed mood and sleep disturbance. The patient is not nervous/anxious.   Musculoskeletal:  ROM 3-100 degrees.  Mild effusion without warmth or erythema.   Objective:  Physical Exam  Constitutional: She is oriented to person, place, and time. She appears well-developed and well-nourished.  HENT:  Head: Normocephalic and atraumatic.  Eyes: Pupils are equal, round, and reactive to light. Conjunctivae and EOM are normal.  Neck: Normal range of motion. Neck supple. No thyromegaly present.  Cardiovascular: Normal rate, regular rhythm, normal heart sounds and intact distal pulses.  No murmur heard. Respiratory: Effort normal and breath sounds normal.  GI: Soft. Bowel sounds are normal. There is no abdominal tenderness.  Neurological: She is alert and oriented to  person, place, and time.  Skin: Skin is warm and dry.  Psychiatric: She has a normal mood and affect. Her behavior is normal. Judgment and thought content normal.    Vital signs in last 24 hours:  BP 112/60, Pulse 78, Resp 16  Ht 5\' 2"  (1.575 m)  Wt 68.6 kg  BMI 27.65 kg/m  BSA 1.73 m  Labs:   Estimated body mass index is 27.65 kg/m as calculated from the following:   Height as of 05/11/19: 5\' 2"  (1.575 m).   Weight as of 05/11/19: 68.6 kg.   Imaging Review Plain radiographs demonstrate moderate degenerative joint disease of the left knee(s). The overall alignment isneutral. The bone quality appears to be good for age and reported activity level.      Assessment/Plan:  End stage arthritis, left knee   The patient history, physical examination, clinical judgment of the provider and imaging studies are consistent with end stage degenerative joint disease  of the left knee(s) and total knee arthroplasty is deemed medically necessary. The treatment options including medical management, injection therapy arthroscopy and arthroplasty were discussed at length. The risks and benefits of total knee arthroplasty were presented and reviewed. The risks due to aseptic loosening, infection, stiffness, patella tracking problems, thromboembolic complications and other imponderables were discussed. The patient acknowledged the explanation, agreed to proceed with the plan and consent was signed. Patient is being admitted for inpatient treatment for surgery, pain control, PT, OT, prophylactic antibiotics, VTE prophylaxis, progressive ambulation and ADL's and discharge planning. The patient is planning to be discharged home with home health services

## 2019-06-02 NOTE — Patient Instructions (Addendum)
DUE TO COVID-19 ONLY ONE VISITOR IS ALLOWED TO COME WITH YOU AND STAY IN THE WAITING ROOM ONLY DURING PRE OP AND PROCEDURE DAY OF SURGERY. THE 1 VISITOR MAY VISIT WITH YOU AFTER SURGERY IN YOUR PRIVATE ROOM DURING VISITING HOURS ONLY!  YOU NEED TO HAVE A COVID 19 TEST ON: 06/04/2019.THIS TEST MUST BE DONE BEFORE SURGERY, COME  [address], [place] , [phone].  [facility]) ONCE YOUR COVID TEST IS COMPLETED, PLEASE BEGIN THE QUARANTINE INSTRUCTIONS AS OUTLINED IN YOUR HANDOUT.                 Your procedure is scheduled on: 06/08/2019   Report [facility] Main  Entrance    Report to SHORT STAY at: 5:30 AM     Call this number if you have problems the morning of surgery [phone]    Remember: Do not eat food or drink liquids :After Midnight.   NO SOLID FOOD AFTER MIDNIGHT THE NIGHT PRIOR TO SURGERY. NOTHING BY MOUTH EXCEPT CLEAR LIQUIDS UNTIL 4:15 AM  PLEASE FINISH ENSURE DRINK PER SURGEON ORDER   WHICH NEEDS TO BE COMPLETED AT 4:15 AM   CLEAR LIQUID DIET   Foods Allowed                                                                     Foods Excluded  Coffee and tea, regular and decaf                             liquids that you cannot  Plain Jell-O any favor except red or purple                                           see through such as: Fruit ices (not with fruit pulp)                                     milk, soups, orange juice  Iced Popsicles                                    All solid food Carbonated beverages, regular and diet                                    Cranberry, grape and apple juices Sports drinks like Gatorade Lightly seasoned clear broth or consume(fat free) Sugar, honey syrup  Sample Menu Breakfast                                Lunch                                     Supper Cranberry juice  Beef broth                            Chicken broth Jell-O                                     Grape  juice                           Apple juice Coffee or tea                        Jell-O                                      Popsicle                                                Coffee or tea                        Coffee or tea  _____________________________________________________________________         Take these medicines the morning of surgery with A SIP OF WATER: Prozac. You may bring and use your Flonase   BRUSH YOUR TEETH MORNING OF SURGERY AND RINSE YOUR MOUTH OUT, NO CHEWING GUM CANDY OR MINTS.                                You may not have any metal on your body including hair pins and              piercings     Do not wear jewelry, make-up, lotions, powders or perfumes, deodorant              Do not wear nail polish on your fingernails.  Do not shave  48 hours prior to surgery.                 Do not bring valuables to the hospital. [name]  Contacts, dentures or bridgework may not be worn into surgery.    You may bring an overnight bag      [place] - Preparing for Surgery Before surgery, you can play an important role.  Because skin is not sterile, your skin needs to be as free of germs as possible.  You can reduce the number of germs on your skin by washing with CHG (chlorahexidine gluconate) soap before surgery.  CHG is an antiseptic cleaner which kills germs and bonds with the skin to continue killing germs even after washing. Please DO NOT use if you have an allergy to CHG or antibacterial soaps.  If your skin becomes reddened/irritated stop using the CHG and inform your nurse when you arrive at Short Stay. Do not shave (including legs and underarms) for at least 48 hours prior to the first CHG shower.  You may shave your face/neck. Please follow these instructions carefully:  1.  Shower  with CHG Soap the night before surgery and the  morning of Surgery.  2.  If you choose to wash your hair, wash your  hair first as usual with your  normal  shampoo.  3.  After you shampoo, rinse your hair and body thoroughly to remove the  shampoo.                           4.  Use CHG as you would any other liquid soap.  You can apply chg directly  to the skin and wash                       Gently with a scrungie or clean washcloth.  5.  Apply the CHG Soap to your body ONLY FROM THE NECK DOWN.   Do not use on face/ open                           Wound or open sores. Avoid contact with eyes, ears mouth and genitals (private parts).                       Wash face,  Genitals (private parts) with your normal soap.             6.  Wash thoroughly, paying special attention to the area where your surgery  will be performed.  7.  Thoroughly rinse your body with warm water from the neck down.  8.  DO NOT shower/wash with your normal soap after using and rinsing off  the CHG Soap.                9.  Pat yourself dry with a clean towel.            10.  Wear clean pajamas.            11.  Place clean sheets on your bed the night of your first shower and do not  sleep with pets. Day of Surgery : Do not apply any lotions/deodorants the morning of surgery.  Please wear clean clothes to the hospital/surgery center.  FAILURE TO FOLLOW THESE INSTRUCTIONS MAY RESULT IN THE CANCELLATION OF YOUR SURGERY PATIENT SIGNATURE_________________________________  NURSE SIGNATURE__________________________________  ________________________________________________________________________   Diana [name]  An incentive spirometer is a tool that can help keep your lungs clear and active. This tool measures how well you are filling your lungs with each breath. Taking long deep breaths may help reverse or decrease the chance of developing breathing (pulmonary) problems (especially infection) following:  A long period of time when you are unable to move or be active. BEFORE THE PROCEDURE   If the spirometer includes an indicator  to show your best effort, your nurse or respiratory therapist will set it to a desired goal.  If possible, sit up straight or lean slightly forward. Try not to slouch.  Hold the incentive spirometer in an upright position. INSTRUCTIONS FOR USE  1. Sit on the edge of your bed if possible, or sit up as far as you can in bed or on a chair. 2. Hold the incentive spirometer in an upright position. 3. Breathe out normally. 4. Place the mouthpiece in your mouth and seal your lips tightly around it. 5. Breathe in slowly and as deeply as possible, raising the piston or the ball toward the top of the column. 6. Hold  your breath for 3-5 seconds or for as long as possible. Allow the piston or ball to fall to the bottom of the column. 7. Remove the mouthpiece from your mouth and breathe out normally. 8. Rest for a few seconds and repeat Steps 1 through 7 at least 10 times every 1-2 hours when you are awake. Take your time and take a few normal breaths between deep breaths. 9. The spirometer may include an indicator to show your best effort. Use the indicator as a goal to work toward during each repetition. 10. After each set of 10 deep breaths, practice coughing to be sure your lungs are clear. If you have an incision (the cut made at the time of surgery), support your incision when coughing by placing a pillow or rolled up towels firmly against it. Once you are able to get out of bed, walk around indoors and cough well. You may stop using the incentive spirometer when instructed by your caregiver.  RISKS AND COMPLICATIONS  Take your time so you do not get dizzy or light-headed.  If you are in pain, you may need to take or ask for pain medication before doing incentive spirometry. It is harder to take a deep breath if you are having pain. AFTER USE  Rest and breathe slowly and easily.  It can be helpful to keep track of a log of your progress. Your caregiver can provide you with a simple table to help  with this. If you are using the spirometer at home, follow these instructions: SEEK MEDICAL CARE IF:   You are having difficultly using the spirometer.  You have trouble using the spirometer as often as instructed.  Your pain medication is not giving enough relief while using the spirometer.  You develop fever of 100.5 F (38.1 C) or higher. SEEK IMMEDIATE MEDICAL CARE IF:   You cough up bloody sputum that had not been present before.  You develop fever of 102 F (38.9 C) or greater.  You develop worsening pain at or near the incision site. MAKE SURE YOU:   Understand these instructions.  Will watch your condition.  Will get help right away if you are not doing well or get worse. Document Released: 10/28/2006 Document Revised: 09/09/2011 Document Reviewed: 12/29/2006 ExitCare Patient Information 2014 [employer], [place].   ________________________________________________________________________  WHAT IS A BLOOD TRANSFUSION? Blood Transfusion Information  A transfusion is the replacement of blood or some of its parts. Blood is made up of multiple cells which provide different functions.  Red blood cells carry oxygen and are used for blood loss replacement.  White blood cells fight against infection.  Platelets control bleeding.  Plasma helps clot blood.  Other blood products are available for specialized needs, such as hemophilia or other clotting disorders. BEFORE THE TRANSFUSION  Who gives blood for transfusions?   Healthy volunteers who are fully evaluated to make sure their blood is safe. This is blood bank blood. Transfusion therapy is the safest it has ever been in the practice of medicine. Before blood is taken from a donor, a complete history is taken to make sure that person has no history of diseases nor engages in risky social behavior (examples are intravenous drug use or sexual activity with multiple partners). The donor's travel history is screened to  minimize risk of transmitting infections, such as malaria. The donated blood is tested for signs of infectious diseases, such as HIV and hepatitis. The blood is then tested to be sure it is compatible  with you in order to minimize the chance of a transfusion reaction. If you or a relative donates blood, this is often done in anticipation of surgery and is not appropriate for emergency situations. It takes many days to process the donated blood. RISKS AND COMPLICATIONS Although transfusion therapy is very safe and saves many lives, the main dangers of transfusion include:   Getting an infectious disease.  Developing a transfusion reaction. This is an allergic reaction to something in the blood you were given. Every precaution is taken to prevent this. The decision to have a blood transfusion has been considered carefully by your caregiver before blood is given. Blood is not given unless the benefits outweigh the risks. AFTER THE TRANSFUSION  Right after receiving a blood transfusion, you will usually feel much better and more energetic. This is especially true if your red blood cells have gotten low (anemic). The transfusion raises the level of the red blood cells which carry oxygen, and this usually causes an energy increase.  The nurse administering the transfusion will monitor you carefully for complications. HOME CARE INSTRUCTIONS  No special instructions are needed after a transfusion. You may find your energy is better. Speak with your caregiver about any limitations on activity for underlying diseases you may have. SEEK MEDICAL CARE IF:   Your condition is not improving after your transfusion.  You develop redness or irritation at the intravenous (IV) site. SEEK IMMEDIATE MEDICAL CARE IF:  Any of the following symptoms occur over the next 12 hours:  Shaking chills.  You have a temperature by mouth above 102 F (38.9 C), not controlled by medicine.  Chest, back, or muscle  pain.  People around you feel you are not acting correctly or are confused.  Shortness of breath or difficulty breathing.  Dizziness and fainting.  You get a rash or develop hives.  You have a decrease in urine output.  Your urine turns a dark color or changes to pink, red, or brown. Any of the following symptoms occur over the next 10 days:  You have a temperature by mouth above 102 F (38.9 C), not controlled by medicine.  Shortness of breath.  Weakness after normal activity.  The white part of the eye turns yellow (jaundice).  You have a decrease in the amount of urine or are urinating less often.  Your urine turns a dark color or changes to pink, red, or brown. Document Released: 06/14/2000 Document Revised: 09/09/2011 Document Reviewed: 02/01/2008 [facility] Patient Information 2014 [place], [place].  _______________________________________________________________________

## 2019-06-04 ENCOUNTER — Other Ambulatory Visit (HOSPITAL_COMMUNITY)
Admission: RE | Admit: 2019-06-04 | Discharge: 2019-06-04 | Disposition: A | Discharge status: Home / Self Care | Payer: Medicare Other | Source: Ambulatory Visit | Attending: Orthopaedic Surgery | Admitting: Orthopaedic Surgery

## 2019-06-04 DIAGNOSIS — Z20828 Contact with and (suspected) exposure to other viral communicable diseases: Secondary | ICD-10-CM | POA: Insufficient documentation

## 2019-06-04 DIAGNOSIS — Z01812 Encounter for preprocedural laboratory examination: Secondary | ICD-10-CM | POA: Diagnosis present

## 2019-06-05 LAB — NOVEL CORONAVIRUS, NAA (HOSP ORDER, SEND-OUT TO REF LAB; TAT 18-24 HRS): SARS-CoV-2, NAA: NOT DETECTED

## 2019-06-07 ENCOUNTER — Other Ambulatory Visit: Payer: Self-pay

## 2019-06-07 ENCOUNTER — Encounter (HOSPITAL_COMMUNITY): Payer: Self-pay

## 2019-06-07 ENCOUNTER — Encounter (HOSPITAL_COMMUNITY)
Admission: RE | Admit: 2019-06-07 | Discharge: 2019-06-07 | Disposition: A | Discharge status: Home / Self Care | Payer: Medicare Other | Source: Ambulatory Visit | Attending: Orthopaedic Surgery | Admitting: Orthopaedic Surgery

## 2019-06-07 DIAGNOSIS — Z01818 Encounter for other preprocedural examination: Secondary | ICD-10-CM

## 2019-06-07 DIAGNOSIS — Z01812 Encounter for preprocedural laboratory examination: Secondary | ICD-10-CM | POA: Diagnosis present

## 2019-06-07 DIAGNOSIS — M1712 Unilateral primary osteoarthritis, left knee: Secondary | ICD-10-CM | POA: Insufficient documentation

## 2019-06-07 LAB — COMPREHENSIVE METABOLIC PANEL
ALT: 17 U/L (ref 0–44)
AST: 23 U/L (ref 15–41)
Albumin: 3.7 g/dL (ref 3.5–5.0)
Alkaline Phosphatase: 99 U/L (ref 38–126)
Anion gap: 11 (ref 5–15)
BUN: 15 mg/dL (ref 8–23)
CO2: 30 mmol/L (ref 22–32)
Calcium: 9.6 mg/dL (ref 8.9–10.3)
Chloride: 98 mmol/L (ref 98–111)
Creatinine, Ser: 0.83 mg/dL (ref 0.44–1.00)
GFR calc Af Amer: >60 mL/min (ref >60)
GFR calc non Af Amer: >60 mL/min (ref >60)
Glucose, Bld: 84 mg/dL (ref 70–99)
Potassium: 3.6 mmol/L (ref 3.5–5.1)
Sodium: 139 mmol/L (ref 135–145)
Total Bilirubin: 1.1 mg/dL (ref 0.3–1.2)
Total Protein: 7.6 g/dL (ref 6.5–8.1)

## 2019-06-07 LAB — CBC WITH DIFFERENTIAL/PLATELET
Abs Immature Granulocytes: 0.03 10*3/uL (ref 0.00–0.07)
Basophils Absolute: 0.1 10*3/uL (ref 0.0–0.1)
Basophils Relative: 1 %
Eosinophils Absolute: 0.7 10*3/uL — ABNORMAL HIGH (ref 0.0–0.5)
Eosinophils Relative: 9 %
HCT: 42.7 % (ref 36.0–46.0)
Hemoglobin: 13.5 g/dL (ref 12.0–15.0)
Immature Granulocytes: 0 %
Lymphocytes Relative: 23 %
Lymphs Abs: 1.8 10*3/uL (ref 0.7–4.0)
MCH: 28.4 pg (ref 26.0–34.0)
MCHC: 31.6 g/dL (ref 30.0–36.0)
MCV: 89.7 fL (ref 80.0–100.0)
Monocytes Absolute: 0.5 10*3/uL (ref 0.1–1.0)
Monocytes Relative: 7 %
Neutro Abs: 4.7 10*3/uL (ref 1.7–7.7)
Neutrophils Relative %: 60 %
Platelets: 359 10*3/uL (ref 150–400)
RBC: 4.76 MIL/uL (ref 3.87–5.11)
RDW: 12.8 % (ref 11.5–15.5)
WBC: 7.7 10*3/uL (ref 4.0–10.5)
nRBC: 0 % (ref 0.0–0.2)

## 2019-06-07 LAB — PROTIME-INR
INR: 0.9 (ref 0.8–1.2)
Prothrombin Time: 12.5 seconds (ref 11.4–15.2)

## 2019-06-07 LAB — SURGICAL PCR SCREEN
MRSA, PCR: NEGATIVE
Staphylococcus aureus: POSITIVE — AB

## 2019-06-07 LAB — APTT: aPTT: 27 seconds (ref 24–36)

## 2019-06-07 MED ORDER — TRANEXAMIC ACID 1000 MG/10ML IV SOLN
2000.0000 mg | INTRAVENOUS | Status: DC
  Filled 2019-06-07: qty 20

## 2019-06-07 NOTE — Progress Notes (Signed)
PCP - Seward Carol, MD Cardiologist - n/a  Chest x-ray - 02/01/2019 EKG -02/01/2019  Stress Test -  ECHO -  Cardiac Cath -   Sleep Study - Diagnosed with sleep apnea through sleep study, does not wear CPAP. CPAP -   Fasting Blood Sugar - n/a Checks Blood Sugar _____ times a day  Blood Thinner Instructions:NONE Aspirin Instructions: Last Dose:  Anesthesia review: No review needed.  Patient denies shortness of breath, fever, cough and chest pain at PAT appointment   Patient verbalized understanding of instructions that were given to them at the PAT appointment. Patient was also instructed that they will need to review over the PAT instructions again at home before surgery.

## 2019-06-08 ENCOUNTER — Encounter (HOSPITAL_COMMUNITY): Payer: Self-pay | Admitting: *Deleted

## 2019-06-08 ENCOUNTER — Encounter (HOSPITAL_COMMUNITY)
Admission: RE | Disposition: A | Discharge status: Home / Self Care | Payer: Self-pay | Source: Home / Self Care | Attending: Orthopaedic Surgery

## 2019-06-08 ENCOUNTER — Observation Stay (HOSPITAL_COMMUNITY)
Admission: RE | Admit: 2019-06-08 | Discharge: 2019-06-09 | Disposition: A | Discharge status: Home / Self Care | Payer: Medicare Other | Attending: Orthopaedic Surgery | Admitting: Orthopaedic Surgery

## 2019-06-08 ENCOUNTER — Ambulatory Visit (HOSPITAL_COMMUNITY): Payer: Medicare Other | Admitting: Physician Assistant

## 2019-06-08 ENCOUNTER — Ambulatory Visit (HOSPITAL_COMMUNITY): Payer: Medicare Other | Admitting: Certified Registered Nurse Anesthetist

## 2019-06-08 ENCOUNTER — Other Ambulatory Visit: Payer: Self-pay

## 2019-06-08 DIAGNOSIS — M659 Synovitis and tenosynovitis, unspecified: Secondary | ICD-10-CM | POA: Diagnosis not present

## 2019-06-08 DIAGNOSIS — Z96651 Presence of right artificial knee joint: Secondary | ICD-10-CM | POA: Diagnosis not present

## 2019-06-08 DIAGNOSIS — E785 Hyperlipidemia, unspecified: Secondary | ICD-10-CM | POA: Diagnosis not present

## 2019-06-08 DIAGNOSIS — Z87891 Personal history of nicotine dependence: Secondary | ICD-10-CM | POA: Diagnosis not present

## 2019-06-08 DIAGNOSIS — M1712 Unilateral primary osteoarthritis, left knee: Principal | ICD-10-CM | POA: Insufficient documentation

## 2019-06-08 DIAGNOSIS — Z905 Acquired absence of kidney: Secondary | ICD-10-CM | POA: Insufficient documentation

## 2019-06-08 DIAGNOSIS — I10 Essential (primary) hypertension: Secondary | ICD-10-CM | POA: Insufficient documentation

## 2019-06-08 DIAGNOSIS — M25762 Osteophyte, left knee: Secondary | ICD-10-CM | POA: Insufficient documentation

## 2019-06-08 DIAGNOSIS — G4730 Sleep apnea, unspecified: Secondary | ICD-10-CM | POA: Diagnosis not present

## 2019-06-08 DIAGNOSIS — F329 Major depressive disorder, single episode, unspecified: Secondary | ICD-10-CM | POA: Insufficient documentation

## 2019-06-08 DIAGNOSIS — M25562 Pain in left knee: Secondary | ICD-10-CM | POA: Diagnosis present

## 2019-06-08 HISTORY — PX: TOTAL KNEE ARTHROPLASTY: SHX125

## 2019-06-08 SURGERY — ARTHROPLASTY, KNEE, TOTAL
Anesthesia: General | Site: Knee | Laterality: Left

## 2019-06-08 MED ORDER — CHLORHEXIDINE GLUCONATE 4 % EX LIQD: 60.0000 mL | Freq: Once | CUTANEOUS | Status: DC

## 2019-06-08 MED ORDER — FLUOXETINE HCL 10 MG PO CAPS
10.0000 mg | ORAL_CAPSULE | Freq: Every day | ORAL | Status: DC
  Administered 2019-06-09: 08:00:00 10 mg via ORAL
  Filled 2019-06-08: qty 1

## 2019-06-08 MED ORDER — DEXAMETHASONE SODIUM PHOSPHATE 10 MG/ML IJ SOLN
INTRAMUSCULAR | Status: AC
  Filled 2019-06-08: qty 1

## 2019-06-08 MED ORDER — PHENOL 1.4 % MT LIQD: 1.0000 | OROMUCOSAL | Status: DC | PRN

## 2019-06-08 MED ORDER — METOCLOPRAMIDE HCL 5 MG/ML IJ SOLN: 5.0000 mg | Freq: Three times a day (TID) | INTRAMUSCULAR | Status: DC | PRN

## 2019-06-08 MED ORDER — OXYCODONE HCL 5 MG PO TABS
5.0000 mg | ORAL_TABLET | ORAL | Status: DC | PRN
  Administered 2019-06-09: 5 mg via ORAL
  Filled 2019-06-08: qty 1

## 2019-06-08 MED ORDER — BISACODYL 10 MG RE SUPP: 10.0000 mg | Freq: Every day | RECTAL | Status: DC | PRN

## 2019-06-08 MED ORDER — IRBESARTAN 150 MG PO TABS
300.0000 mg | ORAL_TABLET | Freq: Every day | ORAL | Status: DC
  Administered 2019-06-09: 300 mg via ORAL
  Filled 2019-06-08: qty 2

## 2019-06-08 MED ORDER — PHENYLEPHRINE 40 MCG/ML (10ML) SYRINGE FOR IV PUSH (FOR BLOOD PRESSURE SUPPORT)
PREFILLED_SYRINGE | INTRAVENOUS | Status: AC
  Filled 2019-06-08: qty 10

## 2019-06-08 MED ORDER — ONDANSETRON HCL 4 MG/2ML IJ SOLN
INTRAMUSCULAR | Status: DC | PRN
  Administered 2019-06-08: 4 mg via INTRAVENOUS

## 2019-06-08 MED ORDER — PROPOFOL 500 MG/50ML IV EMUL
INTRAVENOUS | Status: DC | PRN
  Administered 2019-06-08: 120 mg via INTRAVENOUS
  Administered 2019-06-08: 50 mg via INTRAVENOUS

## 2019-06-08 MED ORDER — APIXABAN 2.5 MG PO TABS
2.5000 mg | ORAL_TABLET | Freq: Two times a day (BID) | ORAL | Status: DC
  Administered 2019-06-09: 2.5 mg via ORAL
  Filled 2019-06-08: qty 1

## 2019-06-08 MED ORDER — FENTANYL CITRATE (PF) 100 MCG/2ML IJ SOLN
INTRAMUSCULAR | Status: DC | PRN
  Administered 2019-06-08 (×4): 50 ug via INTRAVENOUS

## 2019-06-08 MED ORDER — ONDANSETRON HCL 4 MG/2ML IJ SOLN
INTRAMUSCULAR | Status: AC
  Filled 2019-06-08: qty 2

## 2019-06-08 MED ORDER — FENTANYL CITRATE (PF) 100 MCG/2ML IJ SOLN
INTRAMUSCULAR | Status: AC
  Filled 2019-06-08: qty 2

## 2019-06-08 MED ORDER — LACTATED RINGERS IV SOLN: INTRAVENOUS | Status: DC

## 2019-06-08 MED ORDER — METHOCARBAMOL 500 MG IVPB - SIMPLE MED
500.0000 mg | Freq: Four times a day (QID) | INTRAVENOUS | Status: DC | PRN
  Filled 2019-06-08: qty 50

## 2019-06-08 MED ORDER — TELMISARTAN-HCTZ 80-25 MG PO TABS: 1.0000 | ORAL_TABLET | Freq: Every day | ORAL | Status: DC

## 2019-06-08 MED ORDER — LIDOCAINE 2% (20 MG/ML) 5 ML SYRINGE
INTRAMUSCULAR | Status: AC
  Filled 2019-06-08: qty 5

## 2019-06-08 MED ORDER — METHOCARBAMOL 500 MG PO TABS: 500.0000 mg | ORAL_TABLET | Freq: Four times a day (QID) | ORAL | Status: DC | PRN

## 2019-06-08 MED ORDER — HYDROMORPHONE HCL 1 MG/ML IJ SOLN: 0.2500 mg | INTRAMUSCULAR | Status: DC | PRN

## 2019-06-08 MED ORDER — VITAMIN C 500 MG PO TABS
500.0000 mg | ORAL_TABLET | Freq: Every day | ORAL | Status: DC
  Administered 2019-06-09: 08:00:00 500 mg via ORAL
  Filled 2019-06-08: qty 1

## 2019-06-08 MED ORDER — CEFAZOLIN SODIUM-DEXTROSE 2-4 GM/100ML-% IV SOLN
INTRAVENOUS | Status: AC
  Filled 2019-06-08: qty 100

## 2019-06-08 MED ORDER — ALUM & MAG HYDROXIDE-SIMETH 200-200-20 MG/5ML PO SUSP: 30.0000 mL | ORAL | Status: DC | PRN

## 2019-06-08 MED ORDER — PROMETHAZINE HCL 25 MG/ML IJ SOLN: 6.2500 mg | INTRAMUSCULAR | Status: DC | PRN

## 2019-06-08 MED ORDER — EPHEDRINE 5 MG/ML INJ
INTRAVENOUS | Status: AC
  Filled 2019-06-08: qty 10

## 2019-06-08 MED ORDER — CEFAZOLIN SODIUM-DEXTROSE 2-4 GM/100ML-% IV SOLN
2.0000 g | INTRAVENOUS | Status: AC
  Administered 2019-06-08: 08:00:00 2 g via INTRAVENOUS

## 2019-06-08 MED ORDER — CEFAZOLIN SODIUM-DEXTROSE 2-4 GM/100ML-% IV SOLN
2.0000 g | Freq: Four times a day (QID) | INTRAVENOUS | Status: AC
  Administered 2019-06-08 (×2): 2 g via INTRAVENOUS
  Filled 2019-06-08 (×2): qty 100

## 2019-06-08 MED ORDER — ACETAMINOPHEN 10 MG/ML IV SOLN
INTRAVENOUS | Status: AC
  Filled 2019-06-08: qty 100

## 2019-06-08 MED ORDER — SODIUM CHLORIDE 0.9 % IR SOLN
Status: DC | PRN
  Administered 2019-06-08: 1

## 2019-06-08 MED ORDER — ONDANSETRON HCL 4 MG PO TABS: 4.0000 mg | ORAL_TABLET | Freq: Four times a day (QID) | ORAL | Status: DC | PRN

## 2019-06-08 MED ORDER — ACETAMINOPHEN 10 MG/ML IV SOLN
1000.0000 mg | Freq: Once | INTRAVENOUS | Status: AC
  Administered 2019-06-08: 1000 mg via INTRAVENOUS

## 2019-06-08 MED ORDER — FLEET ENEMA 7-19 GM/118ML RE ENEM: 1.0000 | ENEMA | Freq: Once | RECTAL | Status: DC | PRN

## 2019-06-08 MED ORDER — PROPOFOL 500 MG/50ML IV EMUL
INTRAVENOUS | Status: DC | PRN
  Administered 2019-06-08: 100 ug/kg/min via INTRAVENOUS

## 2019-06-08 MED ORDER — TRANEXAMIC ACID 1000 MG/10ML IV SOLN
INTRAVENOUS | Status: DC | PRN
  Administered 2019-06-08: 2000 mg via TOPICAL

## 2019-06-08 MED ORDER — MIDAZOLAM HCL 2 MG/2ML IJ SOLN
INTRAMUSCULAR | Status: DC | PRN
  Administered 2019-06-08 (×2): 1 mg via INTRAVENOUS

## 2019-06-08 MED ORDER — DEXAMETHASONE SODIUM PHOSPHATE 10 MG/ML IJ SOLN
INTRAMUSCULAR | Status: DC | PRN
  Administered 2019-06-08: 8 mg via INTRAVENOUS

## 2019-06-08 MED ORDER — BUPIVACAINE HCL (PF) 0.5 % IJ SOLN
INTRAMUSCULAR | Status: AC
  Filled 2019-06-08: qty 30

## 2019-06-08 MED ORDER — SODIUM CHLORIDE 0.9 % IV SOLN
75.0000 mL/h | INTRAVENOUS | Status: DC
  Administered 2019-06-08: 12:00:00 75 mL/h via INTRAVENOUS

## 2019-06-08 MED ORDER — KETOROLAC TROMETHAMINE 15 MG/ML IJ SOLN
7.5000 mg | Freq: Four times a day (QID) | INTRAMUSCULAR | Status: AC
  Administered 2019-06-08 – 2019-06-09 (×4): 7.5 mg via INTRAVENOUS
  Filled 2019-06-08 (×4): qty 1

## 2019-06-08 MED ORDER — ACETAMINOPHEN 10 MG/ML IV SOLN: 1000.0000 mg | Freq: Once | INTRAVENOUS | Status: DC | PRN

## 2019-06-08 MED ORDER — PROPOFOL 500 MG/50ML IV EMUL
INTRAVENOUS | Status: AC
  Filled 2019-06-08: qty 50

## 2019-06-08 MED ORDER — HYDROCHLOROTHIAZIDE 25 MG PO TABS
25.0000 mg | ORAL_TABLET | Freq: Every day | ORAL | Status: DC
  Administered 2019-06-09: 08:00:00 25 mg via ORAL
  Filled 2019-06-08: qty 1

## 2019-06-08 MED ORDER — ONDANSETRON HCL 4 MG/2ML IJ SOLN: 4.0000 mg | Freq: Four times a day (QID) | INTRAMUSCULAR | Status: DC | PRN

## 2019-06-08 MED ORDER — SODIUM CHLORIDE 0.9 % IV SOLN
INTRAVENOUS | Status: DC
  Administered 2019-06-08: via INTRAVENOUS

## 2019-06-08 MED ORDER — BUPIVACAINE HCL 0.5 % IJ SOLN
INTRAMUSCULAR | Status: DC | PRN
  Administered 2019-06-08: 30 mL

## 2019-06-08 MED ORDER — MAGNESIUM HYDROXIDE 400 MG/5ML PO SUSP: 30.0000 mL | Freq: Every day | ORAL | Status: DC | PRN

## 2019-06-08 MED ORDER — MIDAZOLAM HCL 2 MG/2ML IJ SOLN
INTRAMUSCULAR | Status: AC
  Filled 2019-06-08: qty 2

## 2019-06-08 MED ORDER — DOCUSATE SODIUM 100 MG PO CAPS
100.0000 mg | ORAL_CAPSULE | Freq: Two times a day (BID) | ORAL | Status: DC
  Administered 2019-06-08 – 2019-06-09 (×2): 100 mg via ORAL
  Filled 2019-06-08 (×2): qty 1

## 2019-06-08 MED ORDER — FLUTICASONE PROPIONATE 50 MCG/ACT NA SUSP
2.0000 | Freq: Every day | NASAL | Status: DC | PRN
  Filled 2019-06-08: qty 16

## 2019-06-08 MED ORDER — MEPERIDINE HCL 50 MG/ML IJ SOLN: 6.2500 mg | INTRAMUSCULAR | Status: DC | PRN

## 2019-06-08 MED ORDER — ISOPROPYL ALCOHOL 70 % SOLN
Status: AC
  Filled 2019-06-08: qty 480

## 2019-06-08 MED ORDER — 0.9 % SODIUM CHLORIDE (POUR BTL) OPTIME
TOPICAL | Status: DC | PRN
  Administered 2019-06-08: 1000 mL

## 2019-06-08 MED ORDER — ACETAMINOPHEN 160 MG/5ML PO SOLN: 325.0000 mg | Freq: Once | ORAL | Status: DC | PRN

## 2019-06-08 MED ORDER — DIPHENHYDRAMINE HCL 12.5 MG/5ML PO ELIX: 12.5000 mg | ORAL_SOLUTION | ORAL | Status: DC | PRN

## 2019-06-08 MED ORDER — LACTATED RINGERS IV SOLN
INTRAVENOUS | Status: DC | PRN
  Administered 2019-06-08 (×2): via INTRAVENOUS

## 2019-06-08 MED ORDER — METOCLOPRAMIDE HCL 5 MG PO TABS: 5.0000 mg | ORAL_TABLET | Freq: Three times a day (TID) | ORAL | Status: DC | PRN

## 2019-06-08 MED ORDER — HYDROMORPHONE HCL 1 MG/ML IJ SOLN: 0.5000 mg | INTRAMUSCULAR | Status: DC | PRN

## 2019-06-08 MED ORDER — PROPOFOL 10 MG/ML IV BOLUS
INTRAVENOUS | Status: AC
  Filled 2019-06-08: qty 20

## 2019-06-08 MED ORDER — LIDOCAINE 2% (20 MG/ML) 5 ML SYRINGE
INTRAMUSCULAR | Status: DC | PRN
  Administered 2019-06-08: 60 mg via INTRAVENOUS

## 2019-06-08 MED ORDER — MENTHOL 3 MG MT LOZG: 1.0000 | LOZENGE | OROMUCOSAL | Status: DC | PRN

## 2019-06-08 MED ORDER — ACETAMINOPHEN 325 MG PO TABS: 325.0000 mg | ORAL_TABLET | Freq: Once | ORAL | Status: DC | PRN

## 2019-06-08 SURGICAL SUPPLY — 62 items
BAG DECANTER FOR FLEXI CONT (MISCELLANEOUS) ×2 IMPLANT
BAG SPEC THK2 15X12 ZIP CLS (MISCELLANEOUS) ×1
BAG ZIPLOCK 12X15 (MISCELLANEOUS) ×2 IMPLANT
BLADE SAGITTAL 25.0X1.19X90 (BLADE) ×2 IMPLANT
BOWL SMART MIX CTS (DISPOSABLE) ×2 IMPLANT
CEMENT HV SMART SET (Cement) ×4 IMPLANT
CEMENT TIBIA MBT SIZE 2.5 (Knees) IMPLANT
CLEANER CAUTERY TIP 5X5 PAD (MISCELLANEOUS) IMPLANT
COVER SURGICAL LIGHT HANDLE (MISCELLANEOUS) ×3 IMPLANT
COVER WAND RF STERILE (DRAPES) IMPLANT
CUFF TOURN SGL QUICK 34 (TOURNIQUET CUFF) ×2
CUFF TRNQT CYL 34X4.125X (TOURNIQUET CUFF) ×1 IMPLANT
DECANTER SPIKE VIAL GLASS SM (MISCELLANEOUS) ×2 IMPLANT
DRAPE INCISE 23X17 IOBAN STRL (DRAPES) ×1
DRAPE INCISE 23X17 STRL (DRAPES) IMPLANT
DRAPE INCISE IOBAN 23X17 STRL (DRAPES) ×1 IMPLANT
DRAPE SHEET LG 3/4 BI-LAMINATE (DRAPES) ×4 IMPLANT
DRAPE TOP 10253 STERILE (DRAPES) ×2 IMPLANT
DRSG ADAPTIC 3X8 NADH LF (GAUZE/BANDAGES/DRESSINGS) ×2 IMPLANT
DRSG MEPILEX BORDER 4X12 (GAUZE/BANDAGES/DRESSINGS) ×1 IMPLANT
DRSG MEPITEL 8X12 (GAUZE/BANDAGES/DRESSINGS) ×1 IMPLANT
DRSG PAD ABDOMINAL 8X10 ST (GAUZE/BANDAGES/DRESSINGS) ×2 IMPLANT
DURAPREP 26ML APPLICATOR (WOUND CARE) ×4 IMPLANT
ELECT REM PT RETURN 15FT ADLT (MISCELLANEOUS) ×2 IMPLANT
FEMUR CMTD LCS LEFT MEDIUM (Knees) IMPLANT
FEMUR LCS LEFT MEDIUM (Knees) ×2 IMPLANT
GAUZE SPONGE 4X4 12PLY STRL (GAUZE/BANDAGES/DRESSINGS) ×2 IMPLANT
GLOVE BIOGEL PI IND STRL 8 (GLOVE) ×1 IMPLANT
GLOVE BIOGEL PI IND STRL 8.5 (GLOVE) ×1 IMPLANT
GLOVE BIOGEL PI INDICATOR 8 (GLOVE) ×1
GLOVE BIOGEL PI INDICATOR 8.5 (GLOVE) ×1
GLOVE ECLIPSE 8.0 STRL XLNG CF (GLOVE) ×4 IMPLANT
GLOVE ECLIPSE 8.5 STRL (GLOVE) ×4 IMPLANT
GOWN STRL REUS W/ TWL LRG LVL3 (GOWN DISPOSABLE) ×1 IMPLANT
GOWN STRL REUS W/TWL 2XL LVL3 (GOWN DISPOSABLE) ×2 IMPLANT
GOWN STRL REUS W/TWL LRG LVL3 (GOWN DISPOSABLE) ×8
HANDPIECE INTERPULSE COAX TIP (DISPOSABLE) ×4
HOLDER FOLEY CATH W/STRAP (MISCELLANEOUS) IMPLANT
INSERT LCS COMP STD 10.0MM MED (Knees) ×1 IMPLANT
KIT TURNOVER KIT A (KITS) IMPLANT
MANIFOLD NEPTUNE II (INSTRUMENTS) ×2 IMPLANT
NS IRRIG 1000ML POUR BTL (IV SOLUTION) ×2 IMPLANT
PACK TOTAL KNEE CUSTOM (KITS) ×2 IMPLANT
PAD CLEANER CAUTERY TIP 5X5 (MISCELLANEOUS) ×1
PADDING CAST COTTON 6X4 STRL (CAST SUPPLIES) ×4 IMPLANT
PATELLA LCS 3PEG MED (Knees) ×1 IMPLANT
PENCIL SMOKE EVACUATOR (MISCELLANEOUS) IMPLANT
PIN STEINMAN FIXATION KNEE (PIN) ×1 IMPLANT
PROTECTOR NERVE ULNAR (MISCELLANEOUS) ×2 IMPLANT
SET HNDPC FAN SPRY TIP SCT (DISPOSABLE) ×1 IMPLANT
STAPLER VISISTAT 35W (STAPLE) ×2 IMPLANT
SUCTION FRAZIER HANDLE 12FR (TUBING) ×1
SUCTION TUBE FRAZIER 12FR DISP (TUBING) IMPLANT
SUT BONE WAX W31G (SUTURE) ×2 IMPLANT
SUT ETHIBOND NAB CT1 #1 30IN (SUTURE) ×4 IMPLANT
SUT MNCRL AB 3-0 PS2 18 (SUTURE) ×2 IMPLANT
SUT VIC AB 2-0 PS2 27 (SUTURE) ×2 IMPLANT
TIBIA MBT CEMENT SIZE 2.5 (Knees) ×2 IMPLANT
TRAY FOLEY MTR SLVR 16FR STAT (SET/KITS/TRAYS/PACK) ×2 IMPLANT
UNDERPAD 30X36 HEAVY ABSORB (UNDERPADS AND DIAPERS) ×2 IMPLANT
WATER STERILE IRR 1000ML POUR (IV SOLUTION) ×4 IMPLANT
WRAP KNEE MAXI GEL POST OP (GAUZE/BANDAGES/DRESSINGS) ×2 IMPLANT

## 2019-06-08 NOTE — Transfer of Care (Signed)
Immediate Anesthesia Transfer of Care Note  Patient: Diana [name]  Procedure(s) Performed: Procedure(s): LEFT TOTAL KNEE ARTHROPLASTY (Left)  Patient Location: PACU  Anesthesia Type:General  Level of Consciousness: Alert, Awake, Oriented  Airway & Oxygen Therapy: Patient Spontanous Breathing  Post-op Assessment: Report given to RN  Post vital signs: Reviewed and stable  Last Vitals:  Vitals:   06/08/19 0534 06/08/19 1002  BP: (!) 122/51 (!) 134/98  Pulse: 78 78  Resp: 15 13  Temp: 36.9 C   SpO2: 48% 185%    Complications: No apparent anesthesia complications

## 2019-06-08 NOTE — Op Note (Signed)
PATIENT ID:      Diana [name]  MRN:     [id number] DOB/AGE:    01-29-1951 / 68 y.o.       OPERATIVE REPORT    DATE OF PROCEDURE:  06/08/2019       PREOPERATIVE DIAGNOSIS:   left knee osteoarthritis                                                       Estimated body mass index is 26.7 kg/m as calculated from the following:   Height as of this encounter: 5\' 2"  (1.575 m).   Weight as of this encounter: 66.2 kg.     POSTOPERATIVE DIAGNOSIS:   left knee osteoarthritis                                                                     Estimated body mass index is 26.7 kg/m as calculated from the following:   Height as of this encounter: 5\' 2"  (1.575 m).   Weight as of this encounter: 66.2 kg.     PROCEDURE:  Procedure(s): LEFT TOTAL KNEE ARTHROPLASTY     SURGEON:  [name], MD    ASSISTANT:   Biagio Borg, PA-C   (Present and scrubbed throughout the case, critical for assistance with exposure, retraction, instrumentation, and closure.)          ANESTHESIA: regional and general     DRAINS: none :      TOURNIQUET TIME:  Total Tourniquet Time Documented: Thigh (Left) - 65 minutes Total: Thigh (Left) - 65 minutes     COMPLICATIONS:  None   CONDITION:  stable   [place]: [id number]  06/08/2019, 9:33 AM

## 2019-06-08 NOTE — H&P (Signed)
The recent History & Physical has been reviewed. I have personally examined the patient today. There is no interval change to the documented History & Physical. The patient would like to proceed with the procedure.  Garald Balding 06/08/2019,  7:15 AM

## 2019-06-08 NOTE — Telephone Encounter (Signed)
Received a fax from  [facility] regarding an approval for [name] to 06/30/2020.   Will send document to scan center.   Mariella Saa, PharmD, [place], [place] Clinical Specialty Pharmacist [phone]  06/08/2019 12:07 PM

## 2019-06-08 NOTE — Plan of Care (Signed)
POC initiated 

## 2019-06-08 NOTE — Progress Notes (Signed)
Pt somnolent. Unable to assess Neuro/motor at this time

## 2019-06-08 NOTE — Anesthesia Procedure Notes (Signed)
Procedure Name: LMA Insertion Date/Time: 06/08/2019 7:37 AM Performed by: Gerald Leitz, CRNA Pre-anesthesia Checklist: Patient identified, Patient being monitored, Timeout performed, Emergency Drugs available and Suction available Patient Re-evaluated:Patient Re-evaluated prior to induction Oxygen Delivery Method: Circle system utilized Preoxygenation: Pre-oxygenation with 100% oxygen Induction Type: IV induction Ventilation: Mask ventilation without difficulty LMA: LMA inserted LMA Size: 4.0 Tube type: Oral Number of attempts: 1 Placement Confirmation: positive ETCO2 and breath sounds checked- equal and bilateral Tube secured with: Tape Dental Injury: Teeth and Oropharynx as per pre-operative assessment

## 2019-06-08 NOTE — Anesthesia Procedure Notes (Signed)
Anesthesia Regional Block: Adductor canal block   Pre-Anesthetic Checklist: ,, timeout performed, Correct Patient, Correct Site, Correct Laterality, Correct Procedure, Correct Position, site marked, Risks and benefits discussed,  Surgical consent,  Pre-op evaluation,  At surgeon's request and post-op pain management  Laterality: Left  Prep: chloraprep       Needles:  Injection technique: Single-shot  Needle Type: Echogenic Stimulator Needle     Needle Length: 9cm  Needle Gauge: 21     Additional Needles:   Procedures:,,,, ultrasound used (permanent image in chart),,,,  Narrative:  Start time: 06/08/2019 6:50 AM End time: 06/08/2019 7:00 AM Injection made incrementally with aspirations every 5 mL.  Performed by: Personally  Anesthesiologist: Effie Berkshire, MD  Additional Notes: Patient tolerated the procedure well. Local anesthetic introduced in an incremental fashion under minimal resistance after negative aspirations. No paresthesias were elicited. After completion of the procedure, no acute issues were identified and patient continued to be monitored by RN.

## 2019-06-08 NOTE — Evaluation (Signed)
Physical Therapy Evaluation Patient Details Name: Diana [name] MRN: [id number] DOB: 02-Feb-1951 Today's Date: 06/08/2019   History of Present Illness  Patient is 68 y.o. female s/p Lt TKA on 06/08/19 with PMH significant for RA, OA, HTN, HLD, depression, Rt TKA in August 2020.    Clinical Impression  Diana [name] is a 68 y.o. female POD 0 s/p Lt TKA. Patient reports independence with mobility at baseline. Patient is now limited by functional impairments (see PT problem list below) and requires min assist for transfers and gait with RW. Patient was able to ambulate ~70 feet with RW and min assist. Patient instructed in exercise to facilitate ROM and circulation. Patient will benefit from continued skilled PT interventions to address impairments and progress towards PLOF. Acute PT will follow to progress mobility and stair training in preparation for safe discharge home.    Follow Up Recommendations Follow surgeon's recommendation for DC plan and follow-up therapies    Equipment Recommendations  None recommended by PT    Recommendations for Other Services       Precautions / Restrictions Precautions Precautions: Fall Restrictions Weight Bearing Restrictions: Yes RLE Weight Bearing: Partial weight bearing RLE Partial Weight Bearing Percentage or Pounds: 50% LLE Weight Bearing: Partial weight bearing LLE Partial Weight Bearing Percentage or Pounds: 50%      Mobility  Bed Mobility Overal bed mobility: Needs Assistance Bed Mobility: Supine to Sit     Supine to sit: Supervision;HOB elevated     General bed mobility comments: cues for sequencing and to use bed rail, no assist required  Transfers Overall transfer level: Needs assistance Equipment used: Rolling walker (2 wheeled) Transfers: Sit to/from Stand Sit to Stand: Min assist         General transfer comment: cues for hand placement and technique with RW from EOB and toilet. assist required for power up and cues to  maintain 50% WB on Lt LE.  Ambulation/Gait Ambulation/Gait assistance: Min assist Gait Distance (Feet): 70 Feet(pt walked to bathroom and then out ot hallway) Assistive device: Rolling walker (2 wheeled) Gait Pattern/deviations: Step-through pattern;Decreased stance time - left Gait velocity: decreased   General Gait Details: cues for safe hand placement on RW and for step pattern, assist required for walker positioning to maintain safe proximity. verbal cues to reduce weight bearing in Lt LE to maintain 50% PWB status. no overt LOB noted.  Stairs            Wheelchair Mobility    Modified Rankin (Stroke Patients Only)       Balance Overall balance assessment: Needs assistance Sitting-balance support: Feet supported Sitting balance-Leahy Scale: Good     Standing balance support: During functional activity;Bilateral upper extremity supported Standing balance-Leahy Scale: Poor                  Pertinent Vitals/Pain Pain Assessment: 0-10 Pain Score: 4  Pain Location: Lt knee Pain Descriptors / Indicators: Aching;Dull;Sore Pain Intervention(s): Limited activity within patient's tolerance;Monitored during session;Repositioned;Ice applied    Home Living Family/patient expects to be discharged to:: Private residence Living Arrangements: Non-relatives/Friends Available Help at Discharge: Family;Available 24 hours/day Type of Home: House Home Access: Stairs to enter Entrance Stairs-Rails: Doctor, general practice of Steps: 6 Home Layout: Able to live on main level with bedroom/bathroom;Two level Home Equipment: None;Shower seat;Bedside commode;Walker - 2 wheels Additional Comments: lives with sister    Prior Function Level of Independence: Independent  Hand Dominance   Dominant Hand: Right    Extremity/Trunk Assessment   Upper Extremity Assessment Upper Extremity Assessment: Overall WFL for tasks assessed    Lower Extremity  Assessment Lower Extremity Assessment: Overall WFL for tasks assessed;LLE deficits/detail LLE Deficits / Details: pt with good quad activation, no extensor lag with SLR LLE Sensation: WNL LLE Coordination: WNL    Cervical / Trunk Assessment Cervical / Trunk Assessment: Normal  Communication   Communication: No difficulties  Cognition Arousal/Alertness: Awake/alert Behavior During Therapy: WFL for tasks assessed/performed Overall Cognitive Status: Within Functional Limits for tasks assessed             General Comments      Exercises Total Joint Exercises Ankle Circles/Pumps: AROM;15 reps;Seated;Both Quad Sets: AROM;10 reps;Seated;Left Heel Slides: AROM;10 reps;Seated;Left   Assessment/Plan    PT Assessment Patient needs continued PT services  PT Problem List Decreased balance;Decreased strength;Decreased mobility;Decreased range of motion;Decreased activity tolerance;Decreased knowledge of use of DME       PT Treatment Interventions DME instruction;Functional mobility training;Balance training;Patient/family education;Modalities;Therapeutic activities;Gait training;Stair training;Therapeutic exercise    PT Goals (Current goals can be found in the Care Plan section)  Acute Rehab PT Goals Patient Stated Goal: to go home tomorrow PT Goal Formulation: With patient Time For Goal Achievement: 06/15/19 Potential to Achieve Goals: Good    Frequency 7X/week    AM-PAC PT "6 Clicks" Mobility  Outcome Measure Help needed turning from your back to your side while in a flat bed without using bedrails?: A Little Help needed moving from lying on your back to sitting on the side of a flat bed without using bedrails?: A Little Help needed moving to and from a bed to a chair (including a wheelchair)?: A Little Help needed standing up from a chair using your arms (e.g., wheelchair or bedside chair)?: A Little Help needed to walk in hospital room?: A Little Help needed climbing 3-5  steps with a railing? : A Little 6 Click Score: 18    End of Session Equipment Utilized During Treatment: Gait belt Activity Tolerance: Patient tolerated treatment well Patient left: in chair;with call bell/phone within reach;with chair alarm set;with family/visitor present Nurse Communication: Mobility status PT Visit Diagnosis: Muscle weakness (generalized) (M62.81);Difficulty in walking, not elsewhere classified (R26.2)    Time: 4403-4742 PT Time Calculation (min) (ACUTE ONLY): 37 min   Charges:   PT Evaluation $PT Eval Low Complexity: 1 Low PT Treatments $Gait Training: 8-22 mins       Kipp Brood, PT, DPT Physical Therapist with [facility]  06/08/2019 2:59 PM

## 2019-06-08 NOTE — Op Note (Signed)
NAME: Turbin, Raneshia A. MEDICAL RECORD [id number] ACCOUNT [id number] DATE OF BIRTH:[birth year] FACILITY: [facility] LOCATION: [place], [place]  OPERATIVE REPORT  DATE OF PROCEDURE:  06/08/2019  PREOPERATIVE DIAGNOSIS:  End-stage osteoarthritis, left knee.  POSTOPERATIVE DIAGNOSIS:  End-stage osteoarthritis, left knee.  PROCEDURE:  Left total knee replacement.  SURGEON:  [name], MD  ASSISTANT:  Biagio Borg, PA-C.  ANESTHESIA:  General with adductor canal block.  COMPLICATIONS:  None.  COMPONENTS:  DePuy LCS medium femoral component, a 2.5 rotating keeled tibial tray with a 10 mm polyethylene bridging bearing, a metal backed 3 peg rotating patella.  Components were secured with polymethyl methacrylate.  DESCRIPTION OF PROCEDURE:  The patient was met in the holding area and identified the left knee as the appropriate operative site, marked it accordingly.  Anesthesia performed an adductor canal block.  Any questions were answered.  The patient was then transferred to room #4 and placed under general anesthesia without difficulty.  The left lower extremity was then placed in a thigh tourniquet.  The leg was initially prepped with chlorhexidine scrub and DuraPrep and then we realized  that she had an allergy to IODINE, so then we cleaned the leg with alcohol and then reprepped with ChloraPrep.  Sterile draping was performed.  A timeout was called.  The left lower extremity was then elevated and Esmarch exsanguinated with a proximal tourniquet at 325 mmHg.  A midline longitudinal incision was made centered about the patella.  First layer of capsule was incised in the midline.  A medial parapatellar incision was then made with the Bovie.  There was a small clear yellow joint effusion.  The patella was then everted 180 degrees and the knee flexed to 90 degrees.  There was a moderate amount of beefy red synovitis.  A synovectomy was performed.  There were  small osteophytes along the medial and lateral femoral condyle and medial tibial plateau and these were removed for measuring purposes.  I did measure a medium  femoral component.  First bony cut was then made transversely in the proximal tibia with a 7-degree angle of declination.  With each bony cut on the femur and the tibia, we checked our external alignment with the external guide.  Our flexion and extension gaps were perfectly symmetrical at 10 mm.  MCL and LCL remained intact throughout the procedure.  Laminar spreaders were inserted along the medial and lateral compartments.  I removed medial and lateral menisci, as well as ACL and PCL.  Posterior femoral condyles were cleared of any osteophytes with a 3/4 inch curved osteotome.  Femoral cuts were then made with a 4-degree distal femoral valgus cut using the medium femoral guides.  A finishing guide was then applied to obtain the tapered cuts in the center holes.  Retractors were then placed around the tibia.  We measured a 2.5 tibial tray.  This was pinned in place.  Center hole was then made, followed by the keeled cut.  With the tibial tray trial in place, we applied the 10 mm polyethylene component as our flexion and extension gaps were 10 mm in both flexion and extension.  We applied the trial femoral component.  The entire construct was reduced and through a full range of motion, remained perfectly stable.  Patella was prepared by removing approximately 7 mm of bone, leaving 13 mm of patella thickness.  Three holes were then made with the trial jig and the trial patella applied and reduced and through a full range  of motion remained stable.  The trial components were then removed.  We copiously irrigated the joint with saline solution.  The final components were then impacted with polymethyl methacrylate.  We initially applied the 2.5 tibial tray, followed by the 10 mm polyethylene bridging bearing and the medium femoral component.   The construct was reduced.  We removed any  methacrylate from the periphery of the components.  Patella was applied with the patellar clamp and methacrylate.  At approximately 16 minutes, the methacrylate had matured, during which time, we irrigated the joint and injected the deep capsule with 0.25% Marcaine with epinephrine.  The tourniquet was then deflated at 65 minutes.  We had immediate capillary refill to the joint surface.  Gross bleeding was controlled with Bovie.  We then applied the tranexamic acid under compression topically.  With a nice dry field, the deep capsule  was then closed with a running #1 Ethibond suture, the superficial capsule with a running 0 Vicryl, subcutaneous with 3-0 Monocryl and skin with skin clips.  A sterile bulky dressing was applied, followed by the patient's support stocking.  The patient tolerated the procedure well without complications.  VN/NUANCE  D:06/08/2019 T:06/08/2019 JOB:[id number]

## 2019-06-08 NOTE — Anesthesia Preprocedure Evaluation (Addendum)
Anesthesia Evaluation  Patient identified by MRN, date of birth, ID band Patient awake    Reviewed: Allergy & Precautions, NPO status , Patient's Chart, lab work & pertinent test results  Airway Mallampati: II  TM Distance: >3 FB Neck ROM: Full    Dental  (+) Teeth Intact, Dental Advisory Given   Pulmonary sleep apnea , former smoker,    breath sounds clear to auscultation       Cardiovascular hypertension, Pt. on medications  Rhythm:Regular Rate:Normal     Neuro/Psych PSYCHIATRIC DISORDERS Depression    GI/Hepatic negative GI ROS, Neg liver ROS,   Endo/Other  negative endocrine ROS  Renal/GU   negative genitourinary   Musculoskeletal  (+) Arthritis ,   Abdominal Normal abdominal exam  (+)   Peds  Hematology negative hematology ROS (+)   Anesthesia Other Findings   Reproductive/Obstetrics                            Anesthesia Physical Anesthesia Plan  ASA: II  Anesthesia Plan: General   Post-op Pain Management: GA combined w/ Regional for post-op pain   Induction: Intravenous  PONV Risk Score and Plan: 3 and Ondansetron, Dexamethasone, Propofol infusion, TIVA and Midazolam  Airway Management Planned: LMA  Additional Equipment: None  Intra-op Plan:   Post-operative Plan: Extubation in OR  Informed Consent: I have reviewed the patients History and Physical, chart, labs and discussed the procedure including the risks, benefits and alternatives for the proposed anesthesia with the patient or authorized representative who has indicated his/her understanding and acceptance.     Dental advisory given  Plan Discussed with: CRNA  Anesthesia Plan Comments:        Anesthesia Quick Evaluation

## 2019-06-09 DIAGNOSIS — M1712 Unilateral primary osteoarthritis, left knee: Secondary | ICD-10-CM | POA: Diagnosis not present

## 2019-06-09 LAB — CBC
HCT: 31.8 % — ABNORMAL LOW (ref 36.0–46.0)
Hemoglobin: 9.9 g/dL — ABNORMAL LOW (ref 12.0–15.0)
MCH: 28.4 pg (ref 26.0–34.0)
MCHC: 31.1 g/dL (ref 30.0–36.0)
MCV: 91.1 fL (ref 80.0–100.0)
Platelets: 271 10*3/uL (ref 150–400)
RBC: 3.49 MIL/uL — ABNORMAL LOW (ref 3.87–5.11)
RDW: 12.8 % (ref 11.5–15.5)
WBC: 9.7 10*3/uL (ref 4.0–10.5)
nRBC: 0 % (ref 0.0–0.2)

## 2019-06-09 LAB — BASIC METABOLIC PANEL
Anion gap: 8 (ref 5–15)
BUN: 18 mg/dL (ref 8–23)
CO2: 26 mmol/L (ref 22–32)
Calcium: 8.8 mg/dL — ABNORMAL LOW (ref 8.9–10.3)
Chloride: 107 mmol/L (ref 98–111)
Creatinine, Ser: 0.71 mg/dL (ref 0.44–1.00)
GFR calc Af Amer: >60 mL/min (ref >60)
GFR calc non Af Amer: >60 mL/min (ref >60)
Glucose, Bld: 129 mg/dL — ABNORMAL HIGH (ref 70–99)
Potassium: 3.7 mmol/L (ref 3.5–5.1)
Sodium: 141 mmol/L (ref 135–145)

## 2019-06-09 MED ORDER — OXYCODONE HCL 5 MG PO TABS: 5.0000 mg | ORAL_TABLET | Freq: Four times a day (QID) | ORAL | 0 refills | Status: DC | PRN

## 2019-06-09 MED ORDER — ASPIRIN EC 81 MG PO TBEC: 81.0000 mg | DELAYED_RELEASE_TABLET | Freq: Every day | ORAL | 0 refills | Status: AC

## 2019-06-09 MED ORDER — METHOCARBAMOL 500 MG PO TABS: 500.0000 mg | ORAL_TABLET | Freq: Three times a day (TID) | ORAL | 0 refills | Status: DC | PRN

## 2019-06-09 NOTE — Op Note (Signed)
PATIENT ID: Diana [name]        MRN:  [id number]          DOB/AGE: 1950/08/16 / 68 y.o.    [name], MD   Biagio Borg, PA-C [address] [place], [place]  [address]                             [phone]   PROGRESS NOTE  Subjective:  negative for Chest Pain  negative for Shortness of Breath  negative for Nausea/Vomiting   negative for Calf Pain    Tolerating Diet: yes         Patient reports pain as mild.     Adductor canal block still partially intact  Objective: Vital signs in last 24 hours:    Patient Vitals for the past 24 hrs:  BP Temp Temp src Pulse Resp SpO2 Height Weight  06/09/19 0610 139/87 97.9 F (36.6 C) Oral 78 16 98 % - -  06/09/19 0130 129/64 97.6 F (36.4 C) Oral 74 16 99 % - -  06/08/19 2145 (!) 142/80 97.7 F (36.5 C) Oral 93 - 100 % - -  06/08/19 1844 (!) 157/76 98 F (36.7 C) Oral 93 17 100 % - -  06/08/19 1447 (!) 157/73 97.8 F (36.6 C) Oral 89 16 100 % - -  06/08/19 1418 (!) 143/70 98.2 F (36.8 C) - 95 18 100 % - -  06/08/19 1240 (!) 152/74 97.8 F (36.6 C) Oral 87 15 100 % - -  06/08/19 1218 - - - - - - 5\' 2"  (1.575 m) 66.2 kg  06/08/19 1148 (!) 158/73 97.7 F (36.5 C) Oral 86 15 100 % - -  06/08/19 1130 (!) 147/77 - - 76 12 99 % - -  06/08/19 1115 (!) 141/79 - - 66 12 100 % - -  06/08/19 1100 (!) 145/80 - - 64 11 99 % - -  06/08/19 1045 (!) 136/92 - - 78 14 100 % - -  06/08/19 1030 (!) 136/94 - - 79 12 100 % - -  06/08/19 1015 (!) 136/100 - - 76 17 100 % - -  06/08/19 1002 (!) 134/98 (!) 97.3 F (36.3 C) - 78 13 100 % - -      Intake/Output from previous day:   12/08 0701 - 12/09 0700 In: 3196.3 [P.O.:720; I.V.:2176.3] Out: 1275 [Urine:1200]   Intake/Output this shift:   No intake/output data recorded.   Intake/Output      12/08 0701 - 12/09 0700 12/09 0701 - 12/10 0700   P.O. 720    I.V. (mL/kg) 2176.3 (32.9)    IV Piggyback 300    Total Intake(mL/kg) 3196.3 (48.3)    Urine (mL/kg/hr) 1200 (0.8)    Stool 0     Blood 75    Total Output 1275    Net +1921.3         Urine Occurrence 1 x    Stool Occurrence 0 x       LABORATORY DATA: Recent Labs    06/07/19 1302 06/09/19 0301  WBC 7.7 9.7  HGB 13.5 9.9*  HCT 42.7 31.8*  PLT 359 271   Recent Labs    06/07/19 1302 06/09/19 0301  NA 139 141  K 3.6 3.7  CL 98 107  CO2 30 26  BUN 15 18  CREATININE 0.83 0.71  GLUCOSE 84 129*  CALCIUM 9.6 8.8*   Lab  Results  Component Value Date   INR 0.9 06/07/2019   INR 0.9 02/01/2019   INR 1.02 06/27/2009    Recent Radiographic Studies :  No results found.   Examination:  General appearance: alert, cooperative and no distress  Wound Exam: clean, dry, intact   Drainage:  None: wound tissue dry  Motor Exam: EHL, FHL, Anterior Tibial and Posterior Tibial Intact  Sensory Exam: Superficial Peroneal, Deep Peroneal and Tibial normal  Vascular Exam: Normal  Assessment:    1 Day Post-Op  Procedure(s) (LRB): LEFT TOTAL KNEE ARTHROPLASTY (Left)  ADDITIONAL DIAGNOSIS:  Active Problems:   Osteoarthritis of left knee  Acute Blood Loss Anemia-asymptomatic   Plan: Physical Therapy as ordered Partial Weight Bearing @ 50% (PWB)  DVT Prophylaxis:  Aspirin and TED hose  DISCHARGE PLAN: Home  DISCHARGE NEEDS: HHPT   Patient's anticipated LOS is less than 2 midnights, meeting these requirements: - Younger than 72 - Lives within 1 hour of care - Has a competent adult at home to recover with post-op recover - NO history of  - Chronic pain requiring opiods  - Diabetes  - Coronary Artery Disease  - Heart failure  - Heart attack  - Stroke  - DVT/VTE  - Cardiac arrhythmia  - Respiratory Failure/COPD  - Renal failure  - Anemia  - Advanced Liver disease  OOB with PT, voiding without difficulty, comfortable this am tolerating diet. Dressing changed to aquacell-wound left knee dry. No calf pain, lab fine. Will discharge today         Mat Carne [facility]  Orthopedics  06/09/2019 7:03 AM

## 2019-06-09 NOTE — Progress Notes (Signed)
Reviewed.  Therapy Plan: [facility] DME arranged pre-op.

## 2019-06-09 NOTE — Progress Notes (Signed)
Physical Therapy Treatment Patient Details Name: Diana [name] MRN: [id number] DOB: [birth year] Today's Date: 06/09/2019    History of Present Illness Patient is 68 y.o. female s/p Lt TKA on 06/08/19 with PMH significant for RA, OA, HTN, HLD, depression, Rt TKA in August 2020.    PT Comments    Pt progressing well. She is concerned about her drop in Hgb and bleeding from her knee. Discussed normalcy of this even though it did not occur with her last surgery. Will see again this pm, pt will be ready to d/c later today   Follow Up Recommendations  Follow surgeon's recommendation for DC plan and follow-up therapies     Equipment Recommendations  None recommended by PT    Recommendations for Other Services       Precautions / Restrictions Precautions Precautions: Fall Restrictions Weight Bearing Restrictions: Yes LLE Weight Bearing: Partial weight bearing LLE Partial Weight Bearing Percentage or Pounds: 50%    Mobility  Bed Mobility   Bed Mobility: Supine to Sit     Supine to sit: Supervision;Modified independent (Device/Increase time)        Transfers Overall transfer level: Needs assistance Equipment used: Rolling walker (2 wheeled) Transfers: Sit to/from Stand Sit to Stand: Min guard;Supervision         General transfer comment: cues for hand placement   Ambulation/Gait Ambulation/Gait assistance: Min Emergency planning/management officer (Feet): 120 Feet Assistive device: Rolling walker (2 wheeled) Gait Pattern/deviations: Step-through pattern;Decreased stance time - left     General Gait Details: cues for step to pattern, PWB   Stairs             Wheelchair Mobility    Modified Rankin (Stroke Patients Only)       Balance                                            Cognition Arousal/Alertness: Awake/alert Behavior During Therapy: WFL for tasks assessed/performed Overall Cognitive Status: Within Functional Limits for tasks  assessed                                        Exercises Total Joint Exercises Ankle Circles/Pumps: AROM;Both;10 reps Quad Sets: AROM;Both;10 reps Heel Slides: AAROM;Left;10 reps;AROM Hip ABduction/ADduction: AROM;Left;10 reps Straight Leg Raises: AROM;Left;10 reps Goniometric ROM: 5 to 65 degrees left knee flexion    General Comments        Pertinent Vitals/Pain Pain Assessment: 0-10 Pain Score: 4  Pain Location: Lt knee Pain Descriptors / Indicators: Aching;Dull;Sore Pain Intervention(s): Limited activity within patient's tolerance;Monitored during session;Premedicated before session;Repositioned;Ice applied    Home Living                      Prior Function            PT Goals (current goals can now be found in the care plan section) Acute Rehab PT Goals Patient Stated Goal: to go home tomorrow PT Goal Formulation: With patient Time For Goal Achievement: 06/15/19 Potential to Achieve Goals: Good Progress towards PT goals: Progressing toward goals    Frequency    7X/week      PT Plan Current plan remains appropriate    Co-evaluation  AM-PAC PT "6 Clicks" Mobility   Outcome Measure  Help needed turning from your back to your side while in a flat bed without using bedrails?: None Help needed moving from lying on your back to sitting on the side of a flat bed without using bedrails?: None Help needed moving to and from a bed to a chair (including a wheelchair)?: A Little Help needed standing up from a chair using your arms (e.g., wheelchair or bedside chair)?: A Little Help needed to walk in hospital room?: A Little Help needed climbing 3-5 steps with a railing? : A Little 6 Click Score: 20    End of Session Equipment Utilized During Treatment: Gait belt Activity Tolerance: Patient tolerated treatment well Patient left: with call bell/phone within reach;in chair;with chair alarm set Nurse Communication:  Mobility status PT Visit Diagnosis: Muscle weakness (generalized) (M62.81);Difficulty in walking, not elsewhere classified (R26.2)     Time: 1030-1055 PT Time Calculation (min) (ACUTE ONLY): 25 min  Charges:  $Gait Training: 8-22 mins $Therapeutic Exercise: 8-22 mins                     Kenyon Ana, PT  Pager: [phone] Acute Rehab Dept [facility]): [phone]   06/09/2019    [facility] 06/09/2019, 11:21 AM

## 2019-06-09 NOTE — Progress Notes (Signed)
Pt provided with d/c instructions. After discussing the pt's plan of care upon d/c home, the pt reported no further questions or concerns.  

## 2019-06-09 NOTE — Progress Notes (Signed)
   06/09/19 1500  PT Visit Information  Last PT Received On 06/09/19 Pt making excellent progress. Ready for d/c from PT standpoint  Assistance Needed +1  History of Present Illness Patient is 68 y.o. female s/p Lt TKA on 06/08/19 with PMH significant for RA, OA, HTN, HLD, depression, Rt TKA in August 2020.  Precautions  Precautions Fall  Restrictions  LLE Weight Bearing PWB  LLE Partial Weight Bearing Percentage or Pounds 50%  Pain Assessment  Pain Assessment 0-10  Pain Score 3  Pain Location Lt knee  Pain Descriptors / Indicators Aching;Dull;Sore  Pain Intervention(s) Monitored during session;Limited activity within patient's tolerance;Ice applied;Patient requesting pain meds-RN notified  Cognition  Arousal/Alertness Awake/alert  Behavior During Therapy WFL for tasks assessed/performed  Overall Cognitive Status Within Functional Limits for tasks assessed  Bed Mobility  General bed mobility comments in recliner  Transfers  Overall transfer level Needs assistance  Equipment used Rolling walker (2 wheeled)  Transfers Sit to/from Stand  Sit to Stand Supervision  General transfer comment cues for hand placement   Ambulation/Gait  Ambulation/Gait assistance Supervision  Gait Distance (Feet) 150 Feet  Assistive device Rolling walker (2 wheeled)  Gait Pattern/deviations Step-through pattern;Decreased stance time - left  General Gait Details cues for step to pattern, PWB  Stairs Yes  Stairs assistance Min guard  Stair Management Two rails;Step to pattern;Forwards  Number of Stairs 3  General stair comments cues for sequence   PT - End of Session  Equipment Utilized During Treatment Gait belt  Activity Tolerance Patient tolerated treatment well  Patient left with call bell/phone within reach;in chair;with chair alarm set  Nurse Communication Mobility status   PT - Assessment/Plan  PT Plan Current plan remains appropriate  PT Visit Diagnosis Muscle weakness (generalized)  (M62.81);Difficulty in walking, not elsewhere classified (R26.2)  PT Frequency (ACUTE ONLY) 7X/week  Follow Up Recommendations Follow surgeon's recommendation for DC plan and follow-up therapies  PT equipment None recommended by PT  AM-PAC PT "6 Clicks" Mobility Outcome Measure (Version 2)  Help needed turning from your back to your side while in a flat bed without using bedrails? 4  Help needed moving from lying on your back to sitting on the side of a flat bed without using bedrails? 4  Help needed moving to and from a bed to a chair (including a wheelchair)? 3  Help needed standing up from a chair using your arms (e.g., wheelchair or bedside chair)? 3  Help needed to walk in hospital room? 3  Help needed climbing 3-5 steps with a railing?  3  6 Click Score 20  Consider Recommendation of Discharge To: Home with no services  PT Goal Progression  Progress towards PT goals Progressing toward goals  Acute Rehab PT Goals  PT Goal Formulation With patient  Time For Goal Achievement 06/15/19  Potential to Achieve Goals Good  PT Time Calculation  PT Start Time (ACUTE ONLY) 1525  PT Stop Time (ACUTE ONLY) 1539  PT Time Calculation (min) (ACUTE ONLY) 14 min  PT General Charges  $$ ACUTE PT VISIT 1 Visit  PT Treatments  $Gait Training 8-22 mins

## 2019-06-09 NOTE — Discharge Summary (Signed)
Diana Fears, MD   Biagio Borg, PA-C [address], [place], [place]  [address]                             [phone]  PATIENT ID: Diana [name]        MRN:  [id number]          DOB/AGE: 1950-12-25 / 68 y.o.    DISCHARGE SUMMARY  ADMISSION DATE:    06/08/2019 DISCHARGE DATE:   06/09/2019   ADMISSION DIAGNOSIS: left knee osteoarthritis    DISCHARGE DIAGNOSIS:  left knee osteoarthritis    ADDITIONAL DIAGNOSIS: Active Problems:   Osteoarthritis of left knee  Past Medical History:  Diagnosis Date  . Depression   . Hyperlipidemia   . Hypertension   . Kidney disease    "my one kidney doesnt give me problems"   . 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 Rheumatoid arthritis(714.0)   . Sleep apnea    uses cpap-mod-severe; i used to but then i got tired of it     PROCEDURE: Procedure(s): LEFT TOTAL KNEE ARTHROPLASTY  on 06/08/2019  CONSULTS: none    HISTORY:Diana [name], 68 y.o. female, has a history of pain and functional disability in the left knee due to arthritis and has failed non-surgical conservative treatments for greater than 12 weeks to includecorticosteriod injections, flexibility and strengthening excercises, use of assistive devices, weight reduction as appropriate and activity modification.  Onset of symptoms was gradual, starting 7 years ago with gradually worsening course since that time. The patient noted no past surgery on the left knee(s).  Patient currently rates pain in the left knee(s) at 7 out of 10 with activity. Patient has night pain, worsening of pain with activity and weight bearing, pain that interferes with activities of daily living, pain with passive range of motion, crepitus and joint swelling.  Patient has evidence of subchondral sclerosis, periarticular osteophytes, joint subluxation and joint space narrowing by imaging studies  HOSPITAL COURSE:  Diana [name] is a 68 y.o. admitted on 06/08/2019 and found to have a diagnosis of left knee  osteoarthritis.  After appropriate laboratory studies were obtained  they were taken to the operating room on 06/08/2019 and underwent  Procedure(s): LEFT TOTAL KNEE ARTHROPLASTY  .   They were given perioperative antibiotics:  Anti-infectives (From admission, onward)   Start     Dose/Rate Route Frequency Ordered Stop   06/08/19 1400  ceFAZolin (ANCEF) IVPB 2g/100 mL premix     2 g 200 mL/hr over 30 Minutes Intravenous Every 6 hours 06/08/19 1205 06/08/19 2144   06/08/19 0600  ceFAZolin (ANCEF) IVPB 2g/100 mL premix     2 g 200 mL/hr over 30 Minutes Intravenous On call to O.R. 06/08/19 0533 06/08/19 0730   06/08/19 0535  ceFAZolin (ANCEF) 2-4 GM/100ML-% IVPB    Note to Pharmacy: Randa Evens  : cabinet override      06/08/19 0535 06/08/19 0730    .  Tolerated the procedure well.   Toradol was given post op.  POD #1, allowed out of bed to a chair.  PT for ambulation and exercise program.   IV saline locked and D/C'd at discharge.  O2 discontionued.  The remainder of the hospital course was dedicated to ambulation and strengthening.   The patient was discharged on 1 Day Post-Op in  Stable condition.  Blood products given:none  DIAGNOSTIC STUDIES: Recent vital signs:  Patient Vitals for the past 24 hrs:  BP Temp Temp src Pulse Resp SpO2 Height Weight  06/09/19 0842 (!) 148/55 97.8 F (36.6 C) Oral 80 16 100 % - -  06/09/19 0610 139/87 97.9 F (36.6 C) Oral 78 16 98 % - -  06/09/19 0130 129/64 97.6 F (36.4 C) Oral 74 16 99 % - -  06/08/19 2145 (!) 142/80 97.7 F (36.5 C) Oral 93 - 100 % - -  06/08/19 1844 (!) 157/76 98 F (36.7 C) Oral 93 17 100 % - -  06/08/19 1447 (!) 157/73 97.8 F (36.6 C) Oral 89 16 100 % - -  06/08/19 1418 (!) 143/70 98.2 F (36.8 C) - 95 18 100 % - -  06/08/19 1240 (!) 152/74 97.8 F (36.6 C) Oral 87 15 100 % - -  06/08/19 1218 - - - - - - 5\' 2"  (1.575 m) 66.2 kg  06/08/19 1148 (!) 158/73 97.7 F (36.5 C) Oral 86 15 100 % - -  06/08/19 1130  (!) 147/77 - - 76 12 99 % - -       Recent laboratory studies: Recent Labs    06/07/19 1302 06/09/19 0301  WBC 7.7 9.7  HGB 13.5 9.9*  HCT 42.7 31.8*  PLT 359 271   Recent Labs    06/07/19 1302 06/09/19 0301  NA 139 141  K 3.6 3.7  CL 98 107  CO2 30 26  BUN 15 18  CREATININE 0.83 0.71  GLUCOSE 84 129*  CALCIUM 9.6 8.8*   Lab Results  Component Value Date   INR 0.9 06/07/2019   INR 0.9 02/01/2019   INR 1.02 06/27/2009     Recent Radiographic Studies :  No results found.  DISCHARGE INSTRUCTIONS: Discharge Instructions    CPM   Complete by: As directed    Continuous passive motion machine (CPM):      Use the CPM from 0 to 60 degrees for 6-8 hours per day.      You may increase by 5-10 per day.  You may break it up into 2 or 3 sessions per day.      Use CPM for 3-4 weeks or until you are told to stop.   Call MD / Call 911   Complete by: As directed    If you experience chest pain or shortness of breath, CALL 911 and be transported to the hospital emergency room.  If you develope a fever above 101 F, pus (white drainage) or increased drainage or redness at the wound, or calf pain, call your surgeon's office.   Change dressing   Complete by: As directed    DO NOT CHANGE YOUR DRESSING.   Constipation Prevention   Complete by: As directed    Drink plenty of fluids.  Prune juice may be helpful.  You may use a stool softener, such as Colace (over the counter) 100 mg twice a day.  Use MiraLax (over the counter) for constipation as needed.   Diet general   Complete by: As directed    Discharge instructions   Complete by: As directed    INSTRUCTIONS AFTER JOINT REPLACEMENT   Remove items at home which could result in a fall. This includes throw rugs or furniture in walking pathways ICE to the affected joint every three hours while awake for 30 minutes at a time, for at least the first 3-5 days, and then as needed for pain and swelling.  Continue to use ice for  pain and  swelling. You may notice swelling that will progress down to the foot and ankle.  This is normal after surgery.  Elevate your leg when you are not up walking on it.   Continue to use the breathing machine you got in the hospital (incentive spirometer) which will help keep your temperature down.  It is common for your temperature to cycle up and down following surgery, especially at night when you are not up moving around and exerting yourself.  The breathing machine keeps your lungs expanded and your temperature down.   DIET:  As you were doing prior to hospitalization, we recommend a well-balanced diet.  DRESSING / WOUND CARE / SHOWERING  Keep the surgical dressing until follow up.  The dressing is water proof, so you can shower without any extra covering.  IF THE DRESSING FALLS OFF or the wound gets wet inside, change the dressing with sterile gauze.  Please use good hand washing techniques before changing the dressing.  Do not use any lotions or creams on the incision until instructed by your surgeon.    ACTIVITY  Increase activity slowly as tolerated, but follow the weight bearing instructions below.   No driving for 6 weeks or until further direction given by your physician.  You cannot drive while taking narcotics.  No lifting or carrying greater than 10 lbs. until further directed by your surgeon. Avoid periods of inactivity such as sitting longer than an hour when not asleep. This helps prevent blood clots.  You may return to work once you are authorized by your doctor.     WEIGHT BEARING   Partial weight bearing with assist device as directed.  50%   EXERCISES  Results after joint replacement surgery are often greatly improved when you follow the exercise, range of motion and muscle strengthening exercises prescribed by your doctor. Safety measures are also important to protect the joint from further injury. Any time any of these exercises cause you to have increased pain or  swelling, decrease what you are doing until you are comfortable again and then slowly increase them. If you have problems or questions, call your caregiver or physical therapist for advice.   Rehabilitation is important following a joint replacement. After just a few days of immobilization, the muscles of the leg can become weakened and shrink (atrophy).  These exercises are designed to build up the tone and strength of the thigh and leg muscles and to improve motion. Often times heat used for twenty to thirty minutes before working out will loosen up your tissues and help with improving the range of motion but do not use heat for the first two weeks following surgery (sometimes heat can increase post-operative swelling).   These exercises can be done on a training (exercise) mat,  on a table or on a bed. Use whatever works the best and is most comfortable for you.    Use music or television while you are exercising so that the exercises are a pleasant break in your day. This will make your life better with the exercises acting as a break in your routine that you can look forward to.   Perform all exercises about fifteen times, three times per day or as directed.  You should exercise both the operative leg and the other leg as well.   Exercises include:  Quad Sets - Tighten up the muscle on the front of the thigh (Quad) and hold for 5-10 seconds.   Straight Leg Raises -  With your knee straight (if you were given a brace, keep it on), lift the leg to 60 degrees, hold for 3 seconds, and slowly lower the leg.  Perform this exercise against resistance later as your leg gets stronger.  Leg Slides: Lying on your back, slowly slide your foot toward your buttocks, bending your knee up off the floor (only go as far as is comfortable). Then slowly slide your foot back down until your leg is flat on the floor again.  Angel Wings: Lying on your back spread your legs to the side as far apart as you can without causing  discomfort.  Hamstring Strength:  Lying on your back, push your heel against the floor with your leg straight by tightening up the muscles of your buttocks.  Repeat, but this time bend your knee to a comfortable angle, and push your heel against the floor.  You may put a pillow under the heel to make it more comfortable if necessary.   A rehabilitation program following joint replacement surgery can speed recovery and prevent re-injury in the future due to weakened muscles. Contact your doctor or a physical therapist for more information on knee rehabilitation.    CONSTIPATION  Constipation is defined medically as fewer than three stools per week and severe constipation as less than one stool per week.  Even if you have a regular bowel pattern at home, your normal regimen is likely to be disrupted due to multiple reasons following surgery.  Combination of anesthesia, postoperative narcotics, change in appetite and fluid intake all can affect your bowels.   YOU MUST use at least one of the following options; they are listed in order of increasing strength to get the job done.  They are all available over the counter, and you may need to use some, POSSIBLY even all of these options:    Drink plenty of fluids (prune juice may be helpful) and high fiber foods Colace 100 mg by mouth twice a day  Senokot for constipation as directed and as needed Dulcolax (bisacodyl), take with full glass of water  Miralax (polyethylene glycol) once or twice a day as needed.  If you have tried all these things and are unable to have a bowel movement in the first 3-4 days after surgery call either your surgeon or your primary doctor.    If you experience loose stools or diarrhea, hold the medications until you stool forms back up.  If your symptoms do not get better within 1 week or if they get worse, check with your doctor.  If you experience "the worst abdominal pain ever" or develop nausea or vomiting, please contact  the office immediately for further recommendations for treatment.   ITCHING:  If you experience itching with your medications, try taking only a single pain pill, or even half a pain pill at a time.  You can also use Benadryl over the counter for itching or also to help with sleep.   TED HOSE STOCKINGS:  Use stockings on both legs until for at least 2 weeks or as directed by physician office. They may be removed at night for sleeping.  MEDICATIONS:  See your medication summary on the "After Visit Summary" that nursing will review with you.  You may have some home medications which will be placed on hold until you complete the course of blood thinner medication.  It is important for you to complete the blood thinner medication as prescribed.  PRECAUTIONS:  If you experience chest  pain or shortness of breath - call 911 immediately for transfer to the hospital emergency department.   If you develop a fever greater that 101 F, purulent drainage from wound, increased redness or drainage from wound, foul odor from the wound/dressing, or calf pain - CONTACT YOUR SURGEON.                                                   FOLLOW-UP APPOINTMENTS:  If you do not already have a post-op appointment, please call the office for an appointment to be seen by your surgeon.  Guidelines for how soon to be seen are listed in your "After Visit Summary", but are typically between 1-4 weeks after surgery.  OTHER INSTRUCTIONS:   Knee Replacement:  Do not place pillow under knee, focus on keeping the knee straight while resting. CPM instructions: 0-90 degrees, 2 hours in the morning, 2 hours in the afternoon, and 2 hours in the evening. Place foam block, curve side up under heel at all times except when in CPM or when walking.  DO NOT modify, tear, cut, or change the foam block in any way.  MAKE SURE YOU:  Understand these instructions.  Get help right away if you are not doing well or get worse.    Thank you for  letting us be a part of your medical care team.  It is a privilege we respect greatly.  We hope these instructions will help you stay on track for a fast and full recovery!   Do not put a pillow under the knee. Place it under the heel.   Complete by: As directed    Driving restrictions   Complete by: As directed    No driving for 6 weeks   Increase activity slowly as tolerated   Complete by: As directed    Lifting restrictions   Complete by: As directed    No lifting for 6 weeks   Partial weight bearing   Complete by: As directed    % Body Weight: 50%   Laterality: left   Extremity: Lower   Patient may shower   Complete by: As directed    You may shower over your dressing   TED hose   Complete by: As directed    Use stockings (TED hose) for 3 weeks on left  leg.  You may remove them at night for sleeping.      DISCHARGE MEDICATIONS:   Allergies as of 06/09/2019      Reactions   Other    NO BLOOD PRODUCTS    Iodine Swelling   No shellfish allergy per patient    Latex Rash      Medication List    STOP taking these medications   Humira 40 MG/0.4ML Pskt Generic drug: Adalimumab     TAKE these medications   amoxicillin 500 MG tablet Commonly known as: AMOXIL Take 2,000 mg by mouth See admin instructions. 1 hour prior to dental work   aspirin EC 81 MG tablet Take 1 tablet (81 mg total) by mouth daily.   CENTRUM SILVER PO Take 1 tablet by mouth daily.   cholecalciferol 25 MCG (1000 UT) tablet Commonly known as: VITAMIN D3 Take 1,000 Units by mouth daily.   Fish Oil 500 MG Caps Take 500 mg by mouth daily.   FLUoxetine 10 MG capsule  Commonly known as: PROZAC Take 10 mg by mouth daily.   fluticasone 50 MCG/ACT nasal spray Commonly known as: FLONASE Place 2 sprays into both nostrils daily as needed for allergies or rhinitis.   methocarbamol 500 MG tablet Commonly known as: ROBAXIN Take 1 tablet (500 mg total) by mouth every 8 (eight) hours as needed for  muscle spasms.   oxyCODONE 5 MG immediate release tablet Commonly known as: Oxy IR/ROXICODONE Take 1-2 tablets (5-10 mg total) by mouth every 6 (six) hours as needed for moderate pain (pain score 4-6).   telmisartan-hydrochlorothiazide 80-25 MG tablet Commonly known as: MICARDIS HCT Take 1 tablet by mouth daily.   vitamin C 500 MG tablet Commonly known as: ASCORBIC ACID Take 500 mg by mouth daily.            Durable Medical Equipment  (From admission, onward)         Start     Ordered   06/08/19 1206  DME Walker rolling  Once    Question:  Patient needs a walker to treat with the following condition  Answer:  Total knee replacement status, left   06/08/19 1205   06/08/19 1206  DME 3 n 1  Once     06/08/19 1205   06/08/19 1206  DME Bedside commode  Once    Question:  Patient needs a bedside commode to treat with the following condition  Answer:  Total knee replacement status, left   06/08/19 1205           Discharge Care Instructions  (From admission, onward)         Start     Ordered   06/09/19 0000  Partial weight bearing    Question Answer Comment  % Body Weight 50%   Laterality left   Extremity Lower      06/09/19 1127   06/09/19 0000  Change dressing    Comments: DO NOT CHANGE YOUR DRESSING.   06/09/19 1127          FOLLOW UP VISIT:   Follow-up Information    [facility] Follow up.   Specialty: [facility] Contact information: [address] [place] [address] [phone]        Valeria BatmanWhitfield, Peter W, MD Follow up in 2 week(s).   Specialty: Orthopedic Surgery Contact information: [address] [place] [address] [phone]           DISPOSITION:   Home  CONDITION:  Stable   Oris DroneBrian D. Aleda Granaetrarca, PA-C [facility] [phone]  06/09/2019 11:29 AM

## 2019-06-09 NOTE — Anesthesia Postprocedure Evaluation (Signed)
Anesthesia Post Note  Patient: Diana [name]  Procedure(s) Performed: LEFT TOTAL KNEE ARTHROPLASTY (Left Knee)     Patient location during evaluation: PACU Anesthesia Type: General Level of consciousness: awake and alert Pain management: pain level controlled Vital Signs Assessment: post-procedure vital signs reviewed and stable Respiratory status: spontaneous breathing, nonlabored ventilation, respiratory function stable and patient connected to nasal cannula oxygen Cardiovascular status: blood pressure returned to baseline and stable Postop Assessment: no apparent nausea or vomiting Anesthetic complications: no    Last Vitals:  Vitals:   06/09/19 0610 06/09/19 0842  BP: 139/87 (!) 148/55  Pulse: 78 80  Resp: 16 16  Temp: 36.6 C 36.6 C  SpO2: 98% 100%    Last Pain:  Vitals:   06/09/19 0842  TempSrc: Oral  PainSc:    Pain Goal: Patients Stated Pain Goal: 3 (06/09/19 0610)                 Effie Berkshire

## 2019-06-10 ENCOUNTER — Encounter: Payer: Self-pay | Admitting: *Deleted

## 2019-06-22 ENCOUNTER — Ambulatory Visit (INDEPENDENT_AMBULATORY_CARE_PROVIDER_SITE_OTHER): Payer: Medicare Other | Admitting: Orthopaedic Surgery

## 2019-06-22 ENCOUNTER — Inpatient Hospital Stay: Payer: Medicare Other | Admitting: Orthopedic Surgery

## 2019-06-22 ENCOUNTER — Encounter: Payer: Self-pay | Admitting: Orthopaedic Surgery

## 2019-06-22 ENCOUNTER — Other Ambulatory Visit: Payer: Self-pay

## 2019-06-22 ENCOUNTER — Ambulatory Visit (INDEPENDENT_AMBULATORY_CARE_PROVIDER_SITE_OTHER): Payer: Medicare Other

## 2019-06-22 DIAGNOSIS — M25562 Pain in left knee: Secondary | ICD-10-CM

## 2019-06-22 DIAGNOSIS — M1712 Unilateral primary osteoarthritis, left knee: Secondary | ICD-10-CM

## 2019-06-22 NOTE — Progress Notes (Signed)
Office Visit Note   Patient: Diana [name]           Date of Birth: Aug 23, 1950           MRN: [id number] Visit Date: 06/22/2019              Requested by: Renford Dills, MD [address] [place],  [place] [address] PCP: Renford Dills, MD   Assessment & Plan: Visit Diagnoses:  1. Left knee pain, unspecified chronicity   2. Primary osteoarthritis of left knee     Plan: 2 weeks status post primary left total knee replacement doing well.  Independent with a walker.  Okay to progress to weightbearing as tolerated with and without a cane.  Staples removed and Steri-Strips applied to the knee.  Range of motion was 0 to 90 degrees.  We will plan to see her back in 2 weeks  Follow-Up Instructions: Return in about 2 weeks (around 07/06/2019).   Orders:  Orders Placed This Encounter  Procedures  . XR KNEE 3 VIEW LEFT   No orders of the defined types were placed in this encounter.     Procedures: No procedures performed   Clinical Data: No additional findings.   Subjective: No chief complaint on file. 2-week status post primary left total knee replacement doing well.  Receiving physical therapy and progressing without any problems.  Presently using a walker.  No shortness of breath or chest pain.  No related fever or chills.  HPI  Review of Systems   Objective: Vital Signs: There were no vitals taken for this visit.  Physical Exam  Ortho Exam left knee incision healing without problem.  Surgical clips removed and Steri-Strips applied.  No calf pain.  No distal edema.  Neurologically intact.  Full passive extension and about 90 degrees of flexion without instability.  Specialty Comments:  No specialty comments available.  Imaging: No results found.   PMFS History: Patient Active Problem List   Diagnosis Date Noted  . Osteoarthritis of left knee 06/08/2019  . Unilateral primary osteoarthritis, left knee 05/25/2019  . History of total right knee  replacement 02/02/2019  . Rheumatoid arthritis with positive rheumatoid factor  05/14/2016  . High risk medication use 05/14/2016  . Primary osteoarthritis of both hands 05/14/2016  . G6PD deficiency 05/14/2016  . S/p nephrectomy 05/14/2016  . Essential hypertension 05/14/2016  . Depression 05/14/2016  . Sleep apnea 05/14/2016  . Former smoker 05/14/2016  . History of ankle fracture 05/14/2016  . Acute medial meniscus tear of right knee 10/08/2011    Class: Acute  . Osteoarthritis of right knee 10/08/2011    Class: Chronic   Past Medical History:  Diagnosis Date  . Depression   . Hyperlipidemia   . Hypertension   . Kidney disease    "my one kidney doesnt give me problems"   . Positive PPD    works Engineer, maintenance (IT)  . Rheumatoid arthritis(714.0)   . Sleep apnea    uses cpap-mod-severe; i used to but then i got tired of it     History reviewed. No pertinent family history.  Past Surgical History:  Procedure Laterality Date  . ANKLE ARTHRODESIS  2010   left  . BREAST LUMPECTOMY  1998   rt  . BREAST SURGERY     rt breast bx  . CERVICAL BIOPSY    . KIDNEY SURGERY    . KNEE ARTHROPLASTY    . NEPHRECTOMY  2007   right  .  TOTAL KNEE ARTHROPLASTY Right 02/02/2019   Procedure: RIGHT TOTAL KNEE ARTHROPLASTY;  Surgeon: Garald Balding, MD;  Location: [facility];  Service: Orthopedics;  Laterality: Right;  . TOTAL KNEE ARTHROPLASTY Left 06/08/2019   Procedure: LEFT TOTAL KNEE ARTHROPLASTY;  Surgeon: Garald Balding, MD;  Location: [facility];  Service: Orthopedics;  Laterality: Left;  . TUBAL LIGATION     Social History   Occupational History  . Not on file  Tobacco Use  . Smoking status: Former Smoker    Packs/day: 0.50    Years: 15.00    Pack years: 7.50    Types: Cigarettes    Quit date: 10/04/1995    Years since quitting: 23.7  . Smokeless tobacco: Never Used  Substance and Sexual Activity  . Alcohol use: No  . Drug use: No  . Sexual activity: Not on file     Garald Balding, MD   Note - This record has been created using [employer].  Chart creation errors have been sought, but may not always  have been located. Such creation errors do not reflect on  the standard of medical care.

## 2019-07-07 ENCOUNTER — Other Ambulatory Visit: Payer: Self-pay

## 2019-07-07 ENCOUNTER — Encounter: Payer: Self-pay | Admitting: Orthopaedic Surgery

## 2019-07-07 ENCOUNTER — Ambulatory Visit (INDEPENDENT_AMBULATORY_CARE_PROVIDER_SITE_OTHER): Payer: Medicare Other | Admitting: Orthopaedic Surgery

## 2019-07-07 VITALS — Ht 62.0 in | Wt 143.0 lb

## 2019-07-07 DIAGNOSIS — M1712 Unilateral primary osteoarthritis, left knee: Secondary | ICD-10-CM

## 2019-07-07 NOTE — Progress Notes (Signed)
Office Visit Note   Patient: Diana [name]           Date of Birth: [birth year]           MRN: [id number] Visit Date: 07/07/2019              Requested by: Renford Dills, MD [address] [place],  [place] [address] PCP: Renford Dills, MD   Assessment & Plan: Visit Diagnoses:  1. Unilateral primary osteoarthritis, left knee     Plan: 1 month status post primary left total knee replacement doing very well.  Ambulates with a walker..  Minimal discomfort.  Finishes up physical therapy this week.  Office 6 weeks.  Still disabled.  Complete handicap parking application for another 6 months.  Follow-Up Instructions: Return in about 6 weeks (around 08/18/2019).   Orders:  No orders of the defined types were placed in this encounter.  No orders of the defined types were placed in this encounter.     Procedures: No procedures performed   Clinical Data: No additional findings.   Subjective: Chief Complaint  Patient presents with  . Left Knee - Routine Post Op    4 week post op Left Total Knee Arthroplasty 06/08/19  Patient is here today for a 4 week post op visit. Patient had a left total knee arthroplasty on 06/08/2019. Patient is doing very well, ambulates with cane. Complains of left knee being stiff and occasional muscle spasms. Patient states she will be released from physical therapy tomorrow. If needed will take oxycodone for pain. Patient does request another handicap placard for another 6 months.  HPI  Review of Systems   Objective: Vital Signs: Ht 5\' 2"  (1.575 m)   Wt 143 lb (64.9 kg)   BMI 26.16 kg/m   Physical Exam  Ortho Exam awake alert and oriented x3.  Comfortable sitting.  Left total knee incision healing without problem.  Full extension and flexed about 98 degrees.  No instability.  No calf pain.  No distal edema.  Neurologically intact.  Lacks a few degrees to full active extension i.e. lag consistent with quad weakness.  No localized  areas of tenderness about the left knee  Specialty Comments:  No specialty comments available.  Imaging: No results found.   PMFS History: Patient Active Problem List   Diagnosis Date Noted  . Osteoarthritis of left knee 06/08/2019  . Unilateral primary osteoarthritis, left knee 05/25/2019  . History of total right knee replacement 02/02/2019  . Rheumatoid arthritis with positive rheumatoid factor  05/14/2016  . High risk medication use 05/14/2016  . Primary osteoarthritis of both hands 05/14/2016  . G6PD deficiency 05/14/2016  . S/p nephrectomy 05/14/2016  . Essential hypertension 05/14/2016  . Depression 05/14/2016  . Sleep apnea 05/14/2016  . Former smoker 05/14/2016  . History of ankle fracture 05/14/2016  . Acute medial meniscus tear of right knee 10/08/2011    Class: Acute  . Osteoarthritis of right knee 10/08/2011    Class: Chronic   Past Medical History:  Diagnosis Date  . Depression   . Hyperlipidemia   . Hypertension   . Kidney disease    "my one kidney doesnt give me problems"   . Positive PPD    works 12/08/2011  . Rheumatoid arthritis(714.0)   . Sleep apnea    uses cpap-mod-severe; i used to but then i got tired of it     History reviewed. No pertinent family history.  Past Surgical History:  Procedure Laterality Date  . ANKLE ARTHRODESIS  2010   left  . BREAST LUMPECTOMY  1998   rt  . BREAST SURGERY     rt breast bx  . CERVICAL BIOPSY    . KIDNEY SURGERY    . KNEE ARTHROPLASTY    . NEPHRECTOMY  2007   right  . TOTAL KNEE ARTHROPLASTY Right 02/02/2019   Procedure: RIGHT TOTAL KNEE ARTHROPLASTY;  Surgeon: Garald Balding, MD;  Location: [facility];  Service: Orthopedics;  Laterality: Right;  . TOTAL KNEE ARTHROPLASTY Left 06/08/2019   Procedure: LEFT TOTAL KNEE ARTHROPLASTY;  Surgeon: Garald Balding, MD;  Location: [facility];  Service: Orthopedics;  Laterality: Left;  . TUBAL LIGATION     Social History   Occupational History  . Not on file    Tobacco Use  . Smoking status: Former Smoker    Packs/day: 0.50    Years: 15.00    Pack years: 7.50    Types: Cigarettes    Quit date: 10/04/1995    Years since quitting: 23.7  . Smokeless tobacco: Never Used  Substance and Sexual Activity  . Alcohol use: No  . Drug use: No  . Sexual activity: Not on file

## 2019-08-04 ENCOUNTER — Ambulatory Visit: Payer: Medicare Other

## 2019-08-18 ENCOUNTER — Other Ambulatory Visit: Payer: Self-pay

## 2019-08-18 ENCOUNTER — Encounter: Payer: Self-pay | Admitting: Orthopaedic Surgery

## 2019-08-18 ENCOUNTER — Ambulatory Visit (INDEPENDENT_AMBULATORY_CARE_PROVIDER_SITE_OTHER): Payer: Medicare Other | Admitting: Orthopaedic Surgery

## 2019-08-18 VITALS — Ht 62.0 in | Wt 143.0 lb

## 2019-08-18 DIAGNOSIS — M1712 Unilateral primary osteoarthritis, left knee: Secondary | ICD-10-CM

## 2019-08-18 DIAGNOSIS — Z96652 Presence of left artificial knee joint: Secondary | ICD-10-CM

## 2019-08-18 NOTE — Progress Notes (Signed)
Office Visit Note   Patient: Diana [name]           Date of Birth: 08/17/1950           MRN: [id number] Visit Date: 08/18/2019              Requested by: Renford Dills, MD [address] [place],  [place] [address] PCP: Renford Dills, MD   Assessment & Plan: Visit Diagnoses:  1. Unilateral primary osteoarthritis, left knee   2. H/O total knee replacement, left     Plan: Approximately 10 weeks status post primary left total knee replacement.  Using a single-point cane.  Progressing slowly.  Still has some lack of full extension but "working on it".  I discussed physical therapy but cost could be prohibitive.  We will check her again in a month.  Spent a lot of time discussing exercises to improve flexion and extension.  No obvious problems other than the above with the knee Follow-Up Instructions: Return in about 1 month (around 09/15/2019).   Orders:  No orders of the defined types were placed in this encounter.  No orders of the defined types were placed in this encounter.     Procedures: No procedures performed   Clinical Data: No additional findings.   Subjective: Chief Complaint  Patient presents with  . Left Knee - Follow-up    Left TKA DOS 06/08/2019  Patient presents today for follow up on her left knee. She had a left total knee arthroplasty on 06/08/2019, and is now 10weeks out from surgery. Patient states that she still has some pain, but not taking anything for pain. She is walking with a cane and states that she feels like she just needs some more time to completely be better.  HPI  Review of Systems   Objective: Vital Signs: Ht 5\' 2"  (1.575 m)   Wt 143 lb (64.9 kg)   BMI 26.16 kg/m   Physical Exam  Ortho Exam left knee was not hot warm or red.  Little bit of swelling but not any effusion.  Has developed some widening of her scar similar to the right side but no particular pain.  Opens up a little bit medially and laterally the varus  valgus stress of only 2 or 3 mm.  Negative anterior drawer sign.  Flex just over 100 degrees and lacks about 12 degrees to full extension but with further exercises I think I could reduce that probably to about 7 or 8 degrees.  Neurologically intact  Specialty Comments:  No specialty comments available.  Imaging: No results found.   PMFS History: Patient Active Problem List   Diagnosis Date Noted  . H/O total knee replacement, left 08/18/2019  . Osteoarthritis of left knee 06/08/2019  . Unilateral primary osteoarthritis, left knee 05/25/2019  . History of total right knee replacement 02/02/2019  . Rheumatoid arthritis with positive rheumatoid factor  05/14/2016  . High risk medication use 05/14/2016  . Primary osteoarthritis of both hands 05/14/2016  . G6PD deficiency 05/14/2016  . S/p nephrectomy 05/14/2016  . Essential hypertension 05/14/2016  . Depression 05/14/2016  . Sleep apnea 05/14/2016  . Former smoker 05/14/2016  . History of ankle fracture 05/14/2016  . Acute medial meniscus tear of right knee 10/08/2011    Class: Acute  . Osteoarthritis of right knee 10/08/2011    Class: Chronic   Past Medical History:  Diagnosis Date  . Depression   . Hyperlipidemia   . Hypertension   .  Kidney disease    "my one kidney doesnt give me problems"   . 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 Rheumatoid arthritis(714.0)   . Sleep apnea    uses cpap-mod-severe; i used to but then i got tired of it     History reviewed. No pertinent family history.  Past Surgical History:  Procedure Laterality Date  . ANKLE ARTHRODESIS  2010   left  . BREAST LUMPECTOMY  1998   rt  . BREAST SURGERY     rt breast bx  . CERVICAL BIOPSY    . KIDNEY SURGERY    . KNEE ARTHROPLASTY    . NEPHRECTOMY  2007   right  . TOTAL KNEE ARTHROPLASTY Right 02/02/2019   Procedure: RIGHT TOTAL KNEE ARTHROPLASTY;  Surgeon: Garald Balding, MD;  Location: [facility];  Service: Orthopedics;  Laterality: Right;  .  TOTAL KNEE ARTHROPLASTY Left 06/08/2019   Procedure: LEFT TOTAL KNEE ARTHROPLASTY;  Surgeon: Garald Balding, MD;  Location: [facility];  Service: Orthopedics;  Laterality: Left;  . TUBAL LIGATION     Social History   Occupational History  . Not on file  Tobacco Use  . Smoking status: Former Smoker    Packs/day: 0.50    Years: 15.00    Pack years: 7.50    Types: Cigarettes    Quit date: 10/04/1995    Years since quitting: 23.8  . Smokeless tobacco: Never Used  Substance and Sexual Activity  . Alcohol use: No  . Drug use: No  . Sexual activity: Not on file

## 2019-09-15 ENCOUNTER — Ambulatory Visit (INDEPENDENT_AMBULATORY_CARE_PROVIDER_SITE_OTHER): Payer: Medicare Other | Admitting: Orthopaedic Surgery

## 2019-09-15 ENCOUNTER — Encounter: Payer: Self-pay | Admitting: Orthopaedic Surgery

## 2019-09-15 ENCOUNTER — Other Ambulatory Visit: Payer: Self-pay

## 2019-09-15 VITALS — Ht 62.0 in | Wt 143.0 lb

## 2019-09-15 DIAGNOSIS — M1712 Unilateral primary osteoarthritis, left knee: Secondary | ICD-10-CM

## 2019-09-15 DIAGNOSIS — Z96652 Presence of left artificial knee joint: Secondary | ICD-10-CM | POA: Diagnosis not present

## 2019-09-15 DIAGNOSIS — M1711 Unilateral primary osteoarthritis, right knee: Secondary | ICD-10-CM

## 2019-09-15 NOTE — Progress Notes (Signed)
Office Visit Note   Patient: Diana [name]           Date of Birth: 1951-03-03           MRN: [id number] Visit Date: 09/15/2019              Requested by: Renford Dills, MD [address] [place],  [place] [address] PCP: Renford Dills, MD   Assessment & Plan: Visit Diagnoses:  1. Primary osteoarthritis of left knee   2. Primary osteoarthritis of right knee   3. H/O total knee replacement, left     Plan: Just over 3 months status post primary left total knee replacement and continues to do well.  Still uses a cane on occasion.  Still working with exercises for range of motion and has about 100 degrees of flexion with just about full extension.  There is still some hypertrophy about the left knee compared to the right but no instability and no localized areas of tenderness.  [name] is feeling much better will continue working with her exercises.  She is happy with her results and I will see her in 6 months  Follow-Up Instructions: Return in about 6 months (around 03/17/2020).   Orders:  No orders of the defined types were placed in this encounter.  No orders of the defined types were placed in this encounter.     Procedures: No procedures performed   Clinical Data: No additional findings.   Subjective: Chief Complaint  Patient presents with  . Left Knee - Follow-up    Left TKA DOS 06/08/2019  Patient presents today for a one month follow up on her left knee. She is now 3 months out from a left total knee arthroplasty. Her surgery was on 06/08/2019. She states that her knee is feeling much better. She is still doing her home exercises. She does not take anything for pain.   HPI  Review of Systems   Objective: Vital Signs: Ht 5\' 2"  (1.575 m)   Wt 143 lb (64.9 kg)   BMI 26.16 kg/m   Physical Exam Constitutional:      Appearance: She is well-developed.  Eyes:     Pupils: Pupils are equal, round, and reactive to light.  Pulmonary:     Effort:  Pulmonary effort is normal.  Skin:    General: Skin is warm and dry.  Neurological:     Mental Status: She is alert and oriented to person, place, and time.  Psychiatric:        Behavior: Behavior normal.     Ortho Exam left total knee incision healing without problem.  It is widening a little bit consistent with keloid but had a similar situation when she had a right knee replaced in July 2020.  There is some hypertrophy about the knee but I am not sure she has an effusion.  No instability.  I can measure but 100 degrees with a goniometer in flexion and just about full extension.  Skin otherwise intact.  Neurologically intact  Specialty Comments:  No specialty comments available.  Imaging: No results found.   PMFS History: Patient Active Problem List   Diagnosis Date Noted  . H/O total knee replacement, left 08/18/2019  . Osteoarthritis of left knee 06/08/2019  . Unilateral primary osteoarthritis, left knee 05/25/2019  . History of total right knee replacement 02/02/2019  . Rheumatoid arthritis with positive rheumatoid factor  05/14/2016  . High risk medication use 05/14/2016  . Primary  osteoarthritis of both hands 05/14/2016  . G6PD deficiency 05/14/2016  . S/p nephrectomy 05/14/2016  . Essential hypertension 05/14/2016  . Depression 05/14/2016  . Sleep apnea 05/14/2016  . Former smoker 05/14/2016  . History of ankle fracture 05/14/2016  . Acute medial meniscus tear of right knee 10/08/2011    Class: Acute  . Osteoarthritis of right knee 10/08/2011    Class: Chronic   Past Medical History:  Diagnosis Date  . Depression   . Hyperlipidemia   . Hypertension   . Kidney disease    "my one kidney doesnt give me problems"   . 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 Rheumatoid arthritis(714.0)   . Sleep apnea    uses cpap-mod-severe; i used to but then i got tired of it     History reviewed. No pertinent family history.  Past Surgical History:  Procedure Laterality Date   . ANKLE ARTHRODESIS  2010   left  . BREAST LUMPECTOMY  1998   rt  . BREAST SURGERY     rt breast bx  . CERVICAL BIOPSY    . KIDNEY SURGERY    . KNEE ARTHROPLASTY    . NEPHRECTOMY  2007   right  . TOTAL KNEE ARTHROPLASTY Right 02/02/2019   Procedure: RIGHT TOTAL KNEE ARTHROPLASTY;  Surgeon: Garald Balding, MD;  Location: [facility];  Service: Orthopedics;  Laterality: Right;  . TOTAL KNEE ARTHROPLASTY Left 06/08/2019   Procedure: LEFT TOTAL KNEE ARTHROPLASTY;  Surgeon: Garald Balding, MD;  Location: [facility];  Service: Orthopedics;  Laterality: Left;  . TUBAL LIGATION     Social History   Occupational History  . Not on file  Tobacco Use  . Smoking status: Former Smoker    Packs/day: 0.50    Years: 15.00    Pack years: 7.50    Types: Cigarettes    Quit date: 10/04/1995    Years since quitting: 23.9  . Smokeless tobacco: Never Used  Substance and Sexual Activity  . Alcohol use: No  . Drug use: No  . Sexual activity: Not on file

## 2019-10-06 NOTE — Progress Notes (Signed)
Office Visit Note  Patient: Diana [name]             Date of Birth: 07-May-1951           MRN: [id number]             PCP: Seward Carol, MD Referring: Seward Carol, MD Visit Date: 10/11/2019 Occupation: @GUAROCC @  Subjective:  Medication monitoring  History of Present Illness: Diana [name] is a 69 y.o. female with history of seropositive rheumatoid arthritis and osteoarthritis.  She is on Humira 40 mg sq injections once every 14 days.  According to the patient she had a left knee replacement in December 2020 and during that time held Humira.  She restarted on Humira in January 2021.  She has not had any recent rheumatoid arthritis flares.  She denies any discomfort in her hands or wrist joints at this time.  She denies any feet pain.  She states that she continues to have stiffness and some discomfort in the left knee replacement but states her right knee replacement is doing well.  She continues to perform lower extremity muscle strengthening and stretching exercises on a daily basis.  She has ongoing difficulty climbing steps as well.  Activities of Daily Living:  Patient reports morning stiffness for 0 none.   Patient Denies nocturnal pain.  Difficulty dressing/grooming: Denies Difficulty climbing stairs: Reports Difficulty getting out of chair: Denies Difficulty using hands for taps, buttons, cutlery, and/or writing: Denies  Review of Systems  Constitutional: Negative for fatigue.  HENT: Negative for mouth sores, mouth dryness and nose dryness.   Eyes: Negative for pain, visual disturbance and dryness.  Respiratory: Negative for cough, hemoptysis, shortness of breath and difficulty breathing.   Cardiovascular: Positive for swelling in legs/feet. Negative for chest pain, palpitations and hypertension.  Gastrointestinal: Negative for blood in stool, constipation and diarrhea.  Endocrine: Negative for excessive thirst and increased urination.  Genitourinary: Negative for  difficulty urinating and painful urination.  Musculoskeletal: Positive for gait problem. Negative for arthralgias, joint pain, joint swelling, myalgias, muscle weakness, morning stiffness, muscle tenderness and myalgias.  Skin: Negative for color change, pallor, rash, hair loss, nodules/bumps, skin tightness, ulcers and sensitivity to sunlight.  Allergic/Immunologic: Negative for susceptible to infections.  Neurological: Negative for dizziness, numbness, headaches and weakness.  Hematological: Negative for bruising/bleeding tendency and swollen glands.  Psychiatric/Behavioral: Positive for sleep disturbance. Negative for depressed mood. The patient is not nervous/anxious.     PMFS History:  Patient Active Problem List   Diagnosis Date Noted  . H/O total knee replacement, left 08/18/2019  . Osteoarthritis of left knee 06/08/2019  . Unilateral primary osteoarthritis, left knee 05/25/2019  . History of total right knee replacement 02/02/2019  . Rheumatoid arthritis with positive rheumatoid factor  05/14/2016  . High risk medication use 05/14/2016  . Primary osteoarthritis of both hands 05/14/2016  . G6PD deficiency 05/14/2016  . S/p nephrectomy 05/14/2016  . Essential hypertension 05/14/2016  . Depression 05/14/2016  . Sleep apnea 05/14/2016  . Former smoker 05/14/2016  . History of ankle fracture 05/14/2016  . Acute medial meniscus tear of right knee 10/08/2011    Class: Acute  . Osteoarthritis of right knee 10/08/2011    Class: Chronic    Past Medical History:  Diagnosis Date  . Depression   . Hyperlipidemia   . Hypertension   . Kidney disease    "my one kidney doesnt give me problems"   . Positive PPD    [facility] health  de[facility]  . Rheumatoid arthritis(714.0)   . Sleep apnea    uses cpap-mod-severe; i used to but then i got tired of it     History reviewed. No pertinent family history. Past Surgical History:  Procedure Laterality Date  . ANKLE ARTHRODESIS  2010   left  .  BREAST LUMPECTOMY  1998   rt  . BREAST SURGERY     rt breast bx  . CERVICAL BIOPSY    . KIDNEY SURGERY    . KNEE ARTHROPLASTY    . NEPHRECTOMY  2007   right  . TOTAL KNEE ARTHROPLASTY Right 02/02/2019   Procedure: RIGHT TOTAL KNEE ARTHROPLASTY;  Surgeon: Valeria Batman, MD;  Location: [facility];  Service: Orthopedics;  Laterality: Right;  . TOTAL KNEE ARTHROPLASTY Left 06/08/2019   Procedure: LEFT TOTAL KNEE ARTHROPLASTY;  Surgeon: Valeria Batman, MD;  Location: [facility];  Service: Orthopedics;  Laterality: Left;  . TUBAL LIGATION     Social History   Social History Narrative  . Not on file   Immunization History  Administered Date(s) Administered  . Influenza, High Dose Seasonal PF 04/10/2018  . Pneumococcal Polysaccharide-23 04/10/2018     Objective: Vital Signs: BP 112/65 (BP Location: Left Arm, Patient Position: Sitting, Cuff Size: Normal)   Pulse 69   Resp 16   Ht 5\' 2"  (1.575 m)   Wt 143 lb 12.8 oz (65.2 kg)   BMI 26.30 kg/m    Physical Exam Vitals and nursing note reviewed.  Constitutional:      Appearance: She is well-developed.  HENT:     Head: Normocephalic and atraumatic.  Eyes:     Conjunctiva/sclera: Conjunctivae normal.  Pulmonary:     Effort: Pulmonary effort is normal.  Abdominal:     General: Bowel sounds are normal.     Palpations: Abdomen is soft.  Musculoskeletal:     Cervical back: Normal range of motion.  Lymphadenopathy:     Cervical: No cervical adenopathy.  Skin:    General: Skin is warm and dry.     Capillary Refill: Capillary refill takes less than 2 seconds.  Neurological:     Mental Status: She is alert and oriented to person, place, and time.  Psychiatric:        Behavior: Behavior normal.      Musculoskeletal Exam: C-spine, thoracic spine, and lumbar spine good ROM.  Shoulder joints, elbow joints, wrist joints, MCPs, PIPs, and DIPs good ROM with no synovitis.  Complete fist formation bilaterally.  Hip joints good ROM with no  discomfort.  Left knee replacement has limited extension with warmth.  Right knee replacement has good ROM with no discomfort.  Right knee replacement warm but no effusion.  Ankle joints good ROM with no discomfort.  No tenderness or inflammation of ankle joints.  No tenderness of MTP or PIP joints.   CDAI Exam: CDAI Score: 0.4  Patient Global: 2 mm; Provider Global: 2 mm Swollen: 0 ; Tender: 0  Joint Exam 10/11/2019   No joint exam has been documented for this visit   There is currently no information documented on the homunculus. Go to the Rheumatology activity and complete the homunculus joint exam.  Investigation: No additional findings.  Imaging: No results found.  Recent Labs: Lab Results  Component Value Date   WBC 9.7 06/09/2019   HGB 9.9 (L) 06/09/2019   PLT 271 06/09/2019   NA 141 06/09/2019   K 3.7 06/09/2019   CL 107 06/09/2019   CO2  26 06/09/2019   GLUCOSE 129 (H) 06/09/2019   BUN 18 06/09/2019   CREATININE 0.71 06/09/2019   BILITOT 1.1 06/07/2019   ALKPHOS 99 06/07/2019   AST 23 06/07/2019   ALT 17 06/07/2019   PROT 7.6 06/07/2019   ALBUMIN 3.7 06/07/2019   CALCIUM 8.8 (L) 06/09/2019   GFRAA >60 06/09/2019   QFTBGOLD NEGATIVE 05/07/2017   QFTBGOLDPLUS NEGATIVE 12/09/2018    Speciality Comments: No specialty comments available.  Procedures:  No procedures performed Allergies: Other, Iodine, and Latex   Assessment / Plan:     Visit Diagnoses: Rheumatoid arthritis with positive rheumatoid factor: She has no synovitis or tenderness on exam.  She has not had any recent rheumatoid arthritis flares.  She is clinically doing well on Humira 40 mg every days injections every 14 days.  She held Humira in December 2020 due to having a left knee total arthroplasty performed by Dr. Cleophas Dunker on 06/08/2019.  She restarted on Humira in January 2021.  She will continue on Humira as prescribed.  She does not need any refills at this time.  We will update lab work today.   She was advised to notify us if she develops increased joint pain or joint swelling.  She will follow-up in the office in 5 months.  High risk medication use - Humira 40 mg sq injection every 14 days.  BMP and CBC were drawn on 06/09/2019.  She is due to update lab work today.  Orders for CBC and CMP were released.  She will be due to update lab work in July and every 3 months. TB gold negative on 12/09/18. A future order for TB gold was placed today.- Plan: CBC with Differential/Platelet, COMPLETE METABOLIC PANEL WITH GFR, QuantiFERON-TB Gold Plus  Primary osteoarthritis of both hands: She has PIP and DIP thickening consistent with osteoarthritis of both hands.  No tenderness or synovitis noted.  Joint protection and muscle strengthening were discussed.    S/P total knee arthroplasty, left-performed by Dr. Cleophas Dunker on 06/08/2019.  She has limited extension with going discomfort and warmth.  She continues to perform lower extremity muscle strengthening and ROM exercises. She continues to have difficulty climbing steps.   S/P total knee replacement, right - performed by Dr. Cleophas Dunker on 02/02/2019.  Doing well.  She has good ROM with no discomfort. She has ongoing warmth but no effusion.   Other medical conditions are listed as follows:   G6PD deficiency  History of hypertension  History of nephrectomy  History of sleep apnea  History of depression  History of ankle fracture  Ex-smoker  Lipid screening -She requested to have lipid panel checked today. Plan: Lipid panel  Orders: Orders Placed This Encounter  Procedures  . CBC with Differential/Platelet  . COMPLETE METABOLIC PANEL WITH GFR  . QuantiFERON-TB Gold Plus  . Lipid panel   No orders of the defined types were placed in this encounter.     Follow-Up Instructions: Return in about 5 months (around 03/12/2020) for Rheumatoid arthritis.   Gearldine Bienenstock, PA-C  Note - This record has been created using Dragon software.    Chart creation errors have been sought, but may not always  have been located. Such creation errors do not reflect on  the standard of medical care.

## 2019-10-11 ENCOUNTER — Encounter: Payer: Self-pay | Admitting: Physician Assistant

## 2019-10-11 ENCOUNTER — Other Ambulatory Visit: Payer: Self-pay

## 2019-10-11 ENCOUNTER — Ambulatory Visit: Payer: Medicare Other | Admitting: Physician Assistant

## 2019-10-11 VITALS — BP 112/65 | HR 69 | Resp 16 | Ht 62.0 in | Wt 143.8 lb

## 2019-10-11 DIAGNOSIS — Z8659 Personal history of other mental and behavioral disorders: Secondary | ICD-10-CM

## 2019-10-11 DIAGNOSIS — Z79899 Other long term (current) drug therapy: Secondary | ICD-10-CM

## 2019-10-11 DIAGNOSIS — Z96651 Presence of right artificial knee joint: Secondary | ICD-10-CM

## 2019-10-11 DIAGNOSIS — Z87891 Personal history of nicotine dependence: Secondary | ICD-10-CM

## 2019-10-11 DIAGNOSIS — Z905 Acquired absence of kidney: Secondary | ICD-10-CM

## 2019-10-11 DIAGNOSIS — Z96652 Presence of left artificial knee joint: Secondary | ICD-10-CM

## 2019-10-11 DIAGNOSIS — M0579 Rheumatoid arthritis with rheumatoid factor of multiple sites without organ or systems involvement: Secondary | ICD-10-CM

## 2019-10-11 DIAGNOSIS — M19041 Primary osteoarthritis, right hand: Secondary | ICD-10-CM | POA: Diagnosis not present

## 2019-10-11 DIAGNOSIS — Z8669 Personal history of other diseases of the nervous system and sense organs: Secondary | ICD-10-CM

## 2019-10-11 DIAGNOSIS — M19042 Primary osteoarthritis, left hand: Secondary | ICD-10-CM

## 2019-10-11 DIAGNOSIS — Z13220 Encounter for screening for lipoid disorders: Secondary | ICD-10-CM

## 2019-10-11 DIAGNOSIS — Z8679 Personal history of other diseases of the circulatory system: Secondary | ICD-10-CM

## 2019-10-11 DIAGNOSIS — D75A Glucose-6-phosphate dehydrogenase (G6PD) deficiency without anemia: Secondary | ICD-10-CM

## 2019-10-11 DIAGNOSIS — Z8781 Personal history of (healed) traumatic fracture: Secondary | ICD-10-CM

## 2019-10-11 NOTE — Patient Instructions (Signed)
Standing Labs We placed an order today for your standing lab work.    Please come back and get your standing labs in July and every 3 months   We have open lab daily Monday through Thursday from 8:30-12:30 PM and 1:30-4:30 PM and Friday from 8:30-12:30 PM and 1:30-4:00 PM at the office of Dr. Shaili Deveshwar.   You may experience shorter wait times on Monday and Friday afternoons. The office is located at [address], [address], [place], [place] [address] No appointment is necessary.   Labs are drawn by [facility].  You may receive a bill from [facility] for your lab work.  If you wish to have your labs drawn at another location, please call the office 24 hours in advance to send orders.  If you have any questions regarding directions or hours of operation,  please call [phone].   Just as a reminder please drink plenty of water prior to coming for your lab work. Thanks!   

## 2019-10-12 LAB — CBC WITH DIFFERENTIAL/PLATELET
Absolute Monocytes: 561 cells/uL (ref 200–950)
Basophils Absolute: 40 cells/uL (ref 0–200)
Basophils Relative: 0.6 %
Eosinophils Absolute: 726 cells/uL — ABNORMAL HIGH (ref 15–500)
Eosinophils Relative: 11 %
HCT: 39.5 % (ref 35.0–45.0)
Hemoglobin: 12.5 g/dL (ref 11.7–15.5)
Lymphs Abs: 1267 cells/uL (ref 850–3900)
MCH: 27.1 pg (ref 27.0–33.0)
MCHC: 31.6 g/dL — ABNORMAL LOW (ref 32.0–36.0)
MCV: 85.7 fL (ref 80.0–100.0)
MPV: 9.9 fL (ref 7.5–12.5)
Monocytes Relative: 8.5 %
Neutro Abs: 4006 cells/uL (ref 1500–7800)
Neutrophils Relative %: 60.7 %
Platelets: 337 10*3/uL (ref 140–400)
RBC: 4.61 10*6/uL (ref 3.80–5.10)
RDW: 12.7 % (ref 11.0–15.0)
Total Lymphocyte: 19.2 %
WBC: 6.6 10*3/uL (ref 3.8–10.8)

## 2019-10-12 LAB — LIPID PANEL
Cholesterol: 236 mg/dL — ABNORMAL HIGH (ref <200)
HDL: 44 mg/dL — ABNORMAL LOW (ref >=50)
LDL Cholesterol (Calc): 170 mg/dL (calc) — ABNORMAL HIGH
Non-HDL Cholesterol (Calc): 192 mg/dL (calc) — ABNORMAL HIGH (ref <130)
Total CHOL/HDL Ratio: 5.4 (calc) — ABNORMAL HIGH (ref <5.0)
Triglycerides: 101 mg/dL (ref <150)

## 2019-10-12 LAB — COMPLETE METABOLIC PANEL WITH GFR
AG Ratio: 1.1 (calc) (ref 1.0–2.5)
ALT: 12 U/L (ref 6–29)
AST: 20 U/L (ref 10–35)
Albumin: 3.9 g/dL (ref 3.6–5.1)
Alkaline phosphatase (APISO): 107 U/L (ref 37–153)
BUN: 14 mg/dL (ref 7–25)
CO2: 33 mmol/L — ABNORMAL HIGH (ref 20–32)
Calcium: 10.1 mg/dL (ref 8.6–10.4)
Chloride: 101 mmol/L (ref 98–110)
Creat: 0.8 mg/dL (ref 0.50–0.99)
GFR, Est African American: 87 mL/min/{1.73_m2} (ref >=60)
GFR, Est Non African American: 75 mL/min/{1.73_m2} (ref >=60)
Globulin: 3.4 g/dL (calc) (ref 1.9–3.7)
Glucose, Bld: 84 mg/dL (ref 65–99)
Potassium: 4.5 mmol/L (ref 3.5–5.3)
Sodium: 139 mmol/L (ref 135–146)
Total Bilirubin: 0.6 mg/dL (ref 0.2–1.2)
Total Protein: 7.3 g/dL (ref 6.1–8.1)

## 2019-10-12 NOTE — Progress Notes (Signed)
CBC and CMP WNL.   Cholesterol and LDL are elevated. HDL is low. Triglycerides WNL. Please forward lipid panel to cardiologist.

## 2020-01-11 ENCOUNTER — Other Ambulatory Visit: Payer: Self-pay | Admitting: *Deleted

## 2020-01-11 NOTE — Telephone Encounter (Signed)
Last Visit: 10/11/2019 Next Visit: 03/13/2020 Labs: 10/11/2019 CBC and CMP WNL.  TB Gold: 12/09/2018  Current Dose per office note 10/11/2019: Humira 40 mg sq injection every 14 days  DX:  Rheumatoid arthritis   Left message to advise patient she is due to update labs.

## 2020-01-12 NOTE — Addendum Note (Signed)
Addended by: Henriette Combs on: 01/12/2020 02:15 PM   Modules accepted: Orders

## 2020-01-12 NOTE — Telephone Encounter (Signed)
She is due to update TB gold, CBC, and CMP.  Please make sure all her orders are in Epic.  Ok to refill 30 day supply only.

## 2020-01-12 NOTE — Telephone Encounter (Signed)
I reviewed her chart and I found the same information.  It appears the last prescription was with her renewal application and was for a 3 months supply. She did hold her medication for knee replacement surgery which could be why the count is off.  When we call with lab results we can verify that she has not had a gap in therapy greater than 3 months.

## 2020-01-12 NOTE — Telephone Encounter (Signed)
Patient advised she is due to update lab work. Patient states she is taking her Humira regularly. Patient states she last took her Humira last week. Patient states she had extra and that is why she did not request a refill until now. Patient will come to office this week to update labs.  Last Visit: 10/11/2019 Next Visit: 03/13/2020 Labs: 10/11/2019 CBC and CMP WNL.  TB Gold: 12/09/2018  Current Dose per office note 10/11/2019: Humira 40 mg sq injection every 14 days  DX: Rheumatoid arthritis   Okay to refill 30 day supply Humira?

## 2020-01-13 ENCOUNTER — Other Ambulatory Visit: Payer: Self-pay | Admitting: Pharmacist

## 2020-01-13 ENCOUNTER — Other Ambulatory Visit: Payer: Self-pay

## 2020-01-13 DIAGNOSIS — Z79899 Other long term (current) drug therapy: Secondary | ICD-10-CM

## 2020-01-13 MED ORDER — HUMIRA (2 PEN) 40 MG/0.4ML ~~LOC~~ AJKT: 40.0000 mg | AUTO-INJECTOR | SUBCUTANEOUS | 0 refills | Status: DC

## 2020-01-13 NOTE — Telephone Encounter (Signed)
Verified verbal prescription with Sherron Ales, PA-C to send in Humira 40 mg sq injection every 14 days # 2 pens no refills.

## 2020-01-13 NOTE — Progress Notes (Signed)
Patient presents for labs today. Requested lipid panel.  Orders added.   Verlin Fester, PharmD, [place], CPP Clinical Specialty Pharmacist (Rheumatology and Pulmonology)  01/13/2020 8:49 AM

## 2020-01-13 NOTE — Addendum Note (Signed)
Addended by: Henriette Combs on: 01/13/2020 10:25 AM   Modules accepted: Orders

## 2020-01-13 NOTE — Telephone Encounter (Signed)
Patient aware she is due to update labs and orders are in Epic.

## 2020-01-16 LAB — COMPLETE METABOLIC PANEL WITH GFR
AG Ratio: 1.3 (calc) (ref 1.0–2.5)
ALT: 13 U/L (ref 6–29)
AST: 19 U/L (ref 10–35)
Albumin: 3.9 g/dL (ref 3.6–5.1)
Alkaline phosphatase (APISO): 105 U/L (ref 37–153)
BUN: 16 mg/dL (ref 7–25)
CO2: 31 mmol/L (ref 20–32)
Calcium: 9.7 mg/dL (ref 8.6–10.4)
Chloride: 104 mmol/L (ref 98–110)
Creat: 0.8 mg/dL (ref 0.60–0.93)
GFR, Est African American: 87 mL/min/{1.73_m2} (ref >=60)
GFR, Est Non African American: 75 mL/min/{1.73_m2} (ref >=60)
Globulin: 3.1 g/dL (calc) (ref 1.9–3.7)
Glucose, Bld: 87 mg/dL (ref 65–99)
Potassium: 3.9 mmol/L (ref 3.5–5.3)
Sodium: 141 mmol/L (ref 135–146)
Total Bilirubin: 0.6 mg/dL (ref 0.2–1.2)
Total Protein: 7 g/dL (ref 6.1–8.1)

## 2020-01-16 LAB — CBC WITH DIFFERENTIAL/PLATELET
Absolute Monocytes: 505 cells/uL (ref 200–950)
Basophils Absolute: 52 cells/uL (ref 0–200)
Basophils Relative: 0.9 %
Eosinophils Absolute: 818 cells/uL — ABNORMAL HIGH (ref 15–500)
Eosinophils Relative: 14.1 %
HCT: 40.3 % (ref 35.0–45.0)
Hemoglobin: 12.6 g/dL (ref 11.7–15.5)
Lymphs Abs: 1241 cells/uL (ref 850–3900)
MCH: 27.9 pg (ref 27.0–33.0)
MCHC: 31.3 g/dL — ABNORMAL LOW (ref 32.0–36.0)
MCV: 89.4 fL (ref 80.0–100.0)
MPV: 10.2 fL (ref 7.5–12.5)
Monocytes Relative: 8.7 %
Neutro Abs: 3184 cells/uL (ref 1500–7800)
Neutrophils Relative %: 54.9 %
Platelets: 331 10*3/uL (ref 140–400)
RBC: 4.51 10*6/uL (ref 3.80–5.10)
RDW: 12.1 % (ref 11.0–15.0)
Total Lymphocyte: 21.4 %
WBC: 5.8 10*3/uL (ref 3.8–10.8)

## 2020-01-16 LAB — LIPID PANEL
Cholesterol: 222 mg/dL — ABNORMAL HIGH (ref <200)
HDL: 52 mg/dL (ref >=50)
LDL Cholesterol (Calc): 152 mg/dL (calc) — ABNORMAL HIGH
Non-HDL Cholesterol (Calc): 170 mg/dL (calc) — ABNORMAL HIGH (ref <130)
Total CHOL/HDL Ratio: 4.3 (calc) (ref <5.0)
Triglycerides: 81 mg/dL (ref <150)

## 2020-01-16 LAB — QUANTIFERON-TB GOLD PLUS
Mitogen-NIL: 7.72 IU/mL
NIL: 0.03 IU/mL
QuantiFERON-TB Gold Plus: NEGATIVE
TB1-NIL: 0 IU/mL
TB2-NIL: 0.01 IU/mL

## 2020-01-17 NOTE — Progress Notes (Signed)
TB gold negative.  CMP WNL.  CBC stable.  LDL is elevated. Please notify the patient and forward to PCP.

## 2020-02-28 NOTE — Progress Notes (Signed)
Office Visit Note  Patient: Diana [name]             Date of Birth: 1951/02/25           MRN: [id number]             PCP: Renford Dills, MD Referring: Renford Dills, MD Visit Date: 03/13/2020 Occupation: @GUAROCC @  Subjective:  Low back pain   History of Present Illness: Diana [name] is a 69 y.o. female with history of seropositive rheumatoid arthritis and osteoarthritis.  She is on Humira 40 mg sq injections once every 14 days.  She has not missed any doses of Humira recently.  She denies any recent rheumatoid arthritis flares.  She presents today with increased discomfort in her neck and lower back which started 2 days ago.  According to the patient she was trying to give herself a pedicure and was bending over for a prolonged period of time which she feels exacerbated her discomfort.  She is having trapezius muscle tension and muscle tenderness.  She has not had any symptoms of radiculopathy.  She is not experiencing any other joint pain or joint swelling at this time.  She states that her right knee replacement is doing well but continues to have pain and stiffness in her left knee replacement. She has not had any recent infections.  She has received both COVID-19 vaccinations and is planning on receiving a third dose.  Activities of Daily Living:  Patient reports morning stiffness for  0 minutes.   Patient Reports nocturnal pain.  Difficulty dressing/grooming: Denies Difficulty climbing stairs: Denies Difficulty getting out of chair: Reports Difficulty using hands for taps, buttons, cutlery, and/or writing: Denies  Review of Systems  Constitutional: Negative for fatigue.  HENT: Negative for mouth sores, mouth dryness and nose dryness.   Eyes: Negative for itching and dryness.  Respiratory: Negative for shortness of breath and difficulty breathing.   Cardiovascular: Negative for chest pain and palpitations.  Gastrointestinal: Negative for blood in stool, constipation and  diarrhea.  Endocrine: Negative for increased urination.  Genitourinary: Negative for difficulty urinating.  Musculoskeletal: Positive for arthralgias, joint pain, myalgias, muscle tenderness and myalgias. Negative for joint swelling and morning stiffness.  Skin: Negative for color change, rash and redness.  Allergic/Immunologic: Negative for susceptible to infections.  Neurological: Negative for dizziness, numbness, headaches, memory loss and weakness.  Hematological: Positive for bruising/bleeding tendency.  Psychiatric/Behavioral: Negative for confusion.    PMFS History:  Patient Active Problem List   Diagnosis Date Noted  . H/O total knee replacement, left 08/18/2019  . Osteoarthritis of left knee 06/08/2019  . Unilateral primary osteoarthritis, left knee 05/25/2019  . History of total right knee replacement 02/02/2019  . Rheumatoid arthritis with positive rheumatoid factor  05/14/2016  . High risk medication use 05/14/2016  . Primary osteoarthritis of both hands 05/14/2016  . G6PD deficiency 05/14/2016  . S/p nephrectomy 05/14/2016  . Essential hypertension 05/14/2016  . Depression 05/14/2016  . Sleep apnea 05/14/2016  . Former smoker 05/14/2016  . History of ankle fracture 05/14/2016  . Acute medial meniscus tear of right knee 10/08/2011    Class: Acute  . Osteoarthritis of right knee 10/08/2011    Class: Chronic    Past Medical History:  Diagnosis Date  . Depression   . Hyperlipidemia   . Hypertension   . Kidney disease    "my one kidney doesnt give me problems"   . Positive PPD    works Engineer, maintenance ([employer])  .  Rheumatoid arthritis(714.0)   . Sleep apnea    uses cpap-mod-severe; i used to but then i got tired of it     History reviewed. No pertinent family history. Past Surgical History:  Procedure Laterality Date  . ANKLE ARTHRODESIS  2010   left  . BREAST LUMPECTOMY  1998   rt  . BREAST SURGERY     rt breast bx  . CERVICAL BIOPSY    . KIDNEY SURGERY    . KNEE  ARTHROPLASTY    . NEPHRECTOMY  2007   right  . TOTAL KNEE ARTHROPLASTY Right 02/02/2019   Procedure: RIGHT TOTAL KNEE ARTHROPLASTY;  Surgeon: Valeria Batman, MD;  Location: [facility];  Service: Orthopedics;  Laterality: Right;  . TOTAL KNEE ARTHROPLASTY Left 06/08/2019   Procedure: LEFT TOTAL KNEE ARTHROPLASTY;  Surgeon: Valeria Batman, MD;  Location: [facility];  Service: Orthopedics;  Laterality: Left;  . TUBAL LIGATION     Social History   Social History Narrative  . Not on file   Immunization History  Administered Date(s) Administered  . Influenza, High Dose Seasonal PF 04/10/2018  . PFIZER SARS-COV-2 Vaccination 07/16/2019, 08/06/2019  . Pneumococcal Polysaccharide-23 04/10/2018     Objective: Vital Signs: BP 116/72 (BP Location: Left Arm, Patient Position: Sitting, Cuff Size: Normal)   Pulse 74   Resp 14   Ht 5\' 2"  (1.575 m)   Wt 152 lb (68.9 kg)   BMI 27.80 kg/m    Physical Exam Vitals and nursing note reviewed.  Constitutional:      Appearance: She is well-developed.  HENT:     Head: Normocephalic and atraumatic.  Eyes:     Conjunctiva/sclera: Conjunctivae normal.  Pulmonary:     Effort: Pulmonary effort is normal.  Abdominal:     Palpations: Abdomen is soft.  Musculoskeletal:     Cervical back: Normal range of motion.  Skin:    General: Skin is warm and dry.     Capillary Refill: Capillary refill takes less than 2 seconds.  Neurological:     Mental Status: She is alert and oriented to person, place, and time.  Psychiatric:        Behavior: Behavior normal.      Musculoskeletal Exam: C-spine good ROM.  Trapezius muscle tension and tenderness bilaterally.  Thoracic and lumbar spine good ROM.  No midline spinal tenderness.  No SI joint tenderness.  Tenderness of the paraspinal muscles in the lumbar region.  Shoulder joints, elbow joints, wrist joints, MCPs, PIPs, DIPs have good range of motion with no synovitis.  She has complete fist formation bilaterally.   Mild PIP and DIP thickening consistent with osteoarthritis of both hands noted.  Hip joints have good range of motion with no discomfort.  Right knee replacement has good range of motion with warmth.  Left knee has slightly limited extension with warmth.  Ankle joints have good range of motion with no tenderness or inflammation.  CDAI Exam: CDAI Score: -- Patient Global: --; Provider Global: -- Swollen: --; Tender: -- Joint Exam 03/13/2020   No joint exam has been documented for this visit   There is currently no information documented on the homunculus. Go to the Rheumatology activity and complete the homunculus joint exam.  Investigation: No additional findings.  Imaging: XR Cervical Spine 2 or 3 views  Result Date: 03/13/2020 Multilevel spondylosis with most significant narrowing between C5-6 and C6-7 was noted.  Anterior osteophytes were noted. Facet joint arthropathy was noted. Impression: These findings are consistent  with multilevel spondylosis and facet joint arthropathy.  XR Lumbar Spine 2-3 Views  Result Date: 03/13/2020 Levoscoliosis was noted.  Multilevel spondylosis and facet joint arthropathy was noted.  No significant narrowing was noted between L3-L4 and L4-L5. Impression: These findings are consistent with scoliosis, spondylosis and facet joint arthropathy.   Recent Labs: Lab Results  Component Value Date   WBC 5.8 01/13/2020   HGB 12.6 01/13/2020   PLT 331 01/13/2020   NA 141 01/13/2020   K 3.9 01/13/2020   CL 104 01/13/2020   CO2 31 01/13/2020   GLUCOSE 87 01/13/2020   BUN 16 01/13/2020   CREATININE 0.80 01/13/2020   BILITOT 0.6 01/13/2020   ALKPHOS 99 06/07/2019   AST 19 01/13/2020   ALT 13 01/13/2020   PROT 7.0 01/13/2020   ALBUMIN 3.7 06/07/2019   CALCIUM 9.7 01/13/2020   GFRAA 87 01/13/2020   QFTBGOLD NEGATIVE 05/07/2017   QFTBGOLDPLUS NEGATIVE 01/13/2020    Speciality Comments: No specialty comments available.  Procedures:  No procedures  performed Allergies: Other, Iodine, and Latex   Assessment / Plan:     Visit Diagnoses: Rheumatoid arthritis with positive rheumatoid factor: She has no joint tenderness or synovitis on exam.  She has not had any recent rheumatoid arthritis flares.  She is clinically doing well on Humira 40 mg subcutaneous injections every 14 days.  She has not missed any doses of Humira recently.  She does need a refill of Humira sent to the pharmacy today.  She is not experiencing any discomfort, joint stiffness, or swelling in her hands or feet at this time.  She declined updated x-rays today but we will try to obtain routine x-rays at her next visit to assess for radiographic progression.  She presents today with increased discomfort in her neck and lower back which started 2 days ago.  She is not having any symptoms of radiculopathy.  X-rays of the C-spine and lumbar spine were obtained.  She declined physical therapy at this time but will try home exercises first.  She is not experiencing any other joint pain or inflammation currently.  She will continue on Humira 40 mg subcutaneous injections every 14 days.  She will follow-up in the office in 5 months.  High risk medication use - Humira 40 mg sq injection every 14 days.  CBC and CMP were drawn on 01/13/2020.  She will be due to update lab work in October and every 3 months to monitor for drug toxicity.  Standing orders for CBC and CMP are in place.  TB gold was negative on 01/13/2020 and will continue to be monitored yearly. We discussed the importance of holding Humira if she develops signs or symptoms of an infection and to resume once the infection has completely cleared.  She has received both COVID-19 vaccinations and is planning on receiving the third dose.  She was advised to avoid taking Tylenol or NSAIDs 24 hours prior to receiving a third dose.  She was encouraged to continue to wear a mask and social distance.  She was advised to notify us or her PCP if she  develops a COVID-19 infection in order to receive the antibody infusion.  She voiced understanding.  Primary osteoarthritis of both hands: She has mild PIP and DIP thickening consistent with osteoarthritis of both hands.  She has no tenderness or inflammation on exam.  She is able to make a complete fist bilaterally.  Joint protection and muscle strengthening were discussed.  S/P total knee arthroplasty,  left - performed by Dr. Cleophas Dunker on 06/08/2019.  She continues to experience some discomfort and stiffness in the left knee.  She has slightly limited extension with warmth but no effusion on exam.  S/P total knee replacement, right - performed by Dr. Cleophas Dunker on 02/02/2019.  She has good range of motion with no discomfort.  Warmth but no effusion noted.  Neck pain -She presents today with discomfort in her C-spine which started 2 days ago.  She has good range of motion of the C-spine on exam.  She is not experiencing any symptoms of radiculopathy.  She has trapezius muscle tension and muscle tenderness bilaterally.  We discussed the use of topical agents, massage, and using a heating pad.  X-rays of the C-spine were obtained today which were consistent with C5-C6 C6-C7 disc space narrowing and anterior spurring.  She declined physical therapy at this time but was given a handout of home exercises to perform.  She was advised to notify us if her neck pain persists or worsens and we will proceed with an MRI.  Plan: XR Cervical Spine 2 or 3 views  Chronic midline low back pain without sciatica -She presents today with discomfort in her lower back which started 2 days ago.  According to the patient she was giving herself a pedicure and was bending over for prolonged period of time which she feels exacerbated her discomfort.  She is not experiencing any symptoms of radiculopathy at this time.  She has no midline spinal tenderness or SI joint tenderness.  She has tenderness in the paraspinal muscles in the  lumbar region bilaterally.  X-rays of the lumbar spine were obtained today which were consistent with facet joint arthritis.  She declined a referral to physical therapy at this time.  She would like to try home exercises first.  We discussed the importance of core strengthening.  She was advised to notify us if her symptoms persist or worsen.  Plan: XR Lumbar Spine 2-3 Views  Other medical conditions are listed as follows:   G6PD deficiency  History of sleep apnea  History of hypertension  History of depression  History of ankle fracture  History of nephrectomy  Ex-smoker    Orders: Orders Placed This Encounter  Procedures  . XR Lumbar Spine 2-3 Views  . XR Cervical Spine 2 or 3 views   Meds ordered this encounter  Medications  . Adalimumab (HUMIRA PEN) 40 MG/0.4ML PNKT    Sig: Inject 40 mg into the skin every 14 (fourteen) days.    Dispense:  3 each    Refill:  0    3 kits=6 pens      Follow-Up Instructions: Return in about 5 months (around 08/13/2020) for Rheumatoid arthritis.   Gearldine Bienenstock, PA-C  Note - This record has been created using Dragon software.  Chart creation errors have been sought, but may not always  have been located. Such creation errors do not reflect on  the standard of medical care.

## 2020-03-13 ENCOUNTER — Ambulatory Visit: Payer: Self-pay

## 2020-03-13 ENCOUNTER — Ambulatory Visit (INDEPENDENT_AMBULATORY_CARE_PROVIDER_SITE_OTHER): Payer: Medicare Other | Admitting: Physician Assistant

## 2020-03-13 ENCOUNTER — Other Ambulatory Visit: Payer: Self-pay

## 2020-03-13 ENCOUNTER — Encounter: Payer: Self-pay | Admitting: Physician Assistant

## 2020-03-13 VITALS — BP 116/72 | HR 74 | Resp 14 | Ht 62.0 in | Wt 152.0 lb

## 2020-03-13 DIAGNOSIS — Z79899 Other long term (current) drug therapy: Secondary | ICD-10-CM | POA: Diagnosis not present

## 2020-03-13 DIAGNOSIS — M545 Low back pain, unspecified: Secondary | ICD-10-CM

## 2020-03-13 DIAGNOSIS — Z96652 Presence of left artificial knee joint: Secondary | ICD-10-CM | POA: Diagnosis not present

## 2020-03-13 DIAGNOSIS — M19041 Primary osteoarthritis, right hand: Secondary | ICD-10-CM | POA: Diagnosis not present

## 2020-03-13 DIAGNOSIS — Z8659 Personal history of other mental and behavioral disorders: Secondary | ICD-10-CM

## 2020-03-13 DIAGNOSIS — Z8679 Personal history of other diseases of the circulatory system: Secondary | ICD-10-CM

## 2020-03-13 DIAGNOSIS — Z905 Acquired absence of kidney: Secondary | ICD-10-CM

## 2020-03-13 DIAGNOSIS — Z96651 Presence of right artificial knee joint: Secondary | ICD-10-CM

## 2020-03-13 DIAGNOSIS — M542 Cervicalgia: Secondary | ICD-10-CM | POA: Diagnosis not present

## 2020-03-13 DIAGNOSIS — M0579 Rheumatoid arthritis with rheumatoid factor of multiple sites without organ or systems involvement: Secondary | ICD-10-CM

## 2020-03-13 DIAGNOSIS — M19042 Primary osteoarthritis, left hand: Secondary | ICD-10-CM

## 2020-03-13 DIAGNOSIS — Z87891 Personal history of nicotine dependence: Secondary | ICD-10-CM

## 2020-03-13 DIAGNOSIS — Z8669 Personal history of other diseases of the nervous system and sense organs: Secondary | ICD-10-CM

## 2020-03-13 DIAGNOSIS — G8929 Other chronic pain: Secondary | ICD-10-CM

## 2020-03-13 DIAGNOSIS — Z8781 Personal history of (healed) traumatic fracture: Secondary | ICD-10-CM

## 2020-03-13 DIAGNOSIS — D75A Glucose-6-phosphate dehydrogenase (G6PD) deficiency without anemia: Secondary | ICD-10-CM

## 2020-03-13 MED ORDER — HUMIRA (2 PEN) 40 MG/0.4ML ~~LOC~~ AJKT
40.0000 mg | AUTO-INJECTOR | SUBCUTANEOUS | 0 refills | Status: DC
Start: 2020-03-13 — End: 2020-08-22

## 2020-03-13 NOTE — Patient Instructions (Addendum)
Standing Labs We placed an order today for your standing lab work.   Please have your standing labs drawn in October and every 3 months   If possible, please have your labs drawn 2 weeks prior to your appointment so that the provider can discuss your results at your appointment.  We have open lab daily Monday through Thursday from 8:30-12:30 PM and 1:30-4:30 PM and Friday from 8:30-12:30 PM and 1:30-4:00 PM at the office of Dr. Pollyann Savoy, [facility].   Please be advised, patients with office appointments requiring lab work will take precedents over walk-in lab work.  If possible, please come for your lab work on Monday and Friday afternoons, as you may experience shorter wait times. The office is located at [address], [place], [place] [address] No appointment is necessary.   Labs are drawn by [facility]. Please bring your co-pay at the time of your lab draw.  You may receive a bill from [facility] for your lab work.  If you wish to have your labs drawn at another location, please call the office 24 hours in advance to send orders.  If you have any questions regarding directions or hours of operation,  please call [phone].   As a reminder, please drink plenty of water prior to coming for your lab work. Thanks!   COVID-19 vaccine recommendations:   COVID-19 vaccine is recommended for everyone (unless you are allergic to a vaccine component), even if you are on a medication that suppresses your immune system.   If you are on Methotrexate, Cellcept (mycophenolate), Rinvoq, Harriette Ohara, and Olumiant- hold the medication for 1 week after each vaccine. Hold Methotrexate for 2 weeks after the single dose COVID-19 vaccine.   If you are on Orencia subcutaneous injection - hold medication one week prior to and one week after the first COVID-19 vaccine dose (only).   If you are on Orencia IV infusions- time vaccination administration so that the first COVID-19  vaccination will occur four weeks after the infusion and postpone the subsequent infusion by one week.   If you are on Cyclophosphamide or Rituxan infusions please contact your doctor prior to receiving the COVID-19 vaccine.   Do not take Tylenol or any anti-inflammatory medications (NSAIDs) 24 hours prior to the COVID-19 vaccination.   There is no direct evidence about the efficacy of the COVID-19 vaccine in individuals who are on medications that suppress the immune system.   Even if you are fully vaccinated, and you are on any medications that suppress your immune system, please continue to wear a mask, maintain at least six feet social distance and practice hand hygiene.   If you develop a COVID-19 infection, please contact your PCP or our office to determine if you need antibody infusion.  The booster vaccine is now available for immunocompromised patients. It is advised that if you had Pfizer vaccine you should get [employer] booster.  If you had a Moderna vaccine then you should get a Moderna booster. Johnson and Laural Benes does not have a booster vaccine at this time.  Please see the following web sites for updated information.   https://www.rheumatology.org/Portals/0/Files/COVID-19-Vaccination-Patient-Resources.pdf  https://www.rheumatology.org/About-Us/Newsroom/Press-Releases/ID/1159   Back Exercises These exercises help to make your trunk and back strong. They also help to keep the lower back flexible. Doing these exercises can help to prevent back pain or lessen existing pain.  If you have back pain, try to do these exercises 2-3 times each day or as told by your doctor.  As  you get better, do the exercises once each day. Repeat the exercises more often as told by your doctor.  To stop back pain from coming back, do the exercises once each day, or as told by your doctor. Exercises Single knee to chest Do these steps 3-5 times in a row for each leg: 1. Lie on your back on a firm  bed or the floor with your legs stretched out. 2. Bring one knee to your chest. 3. Grab your knee or thigh with both hands and hold them it in place. 4. Pull on your knee until you feel a gentle stretch in your lower back or buttocks. 5. Keep doing the stretch for 10-30 seconds. 6. Slowly let go of your leg and straighten it. Pelvic tilt Do these steps 5-10 times in a row: 1. Lie on your back on a firm bed or the floor with your legs stretched out. 2. Bend your knees so they point up to the ceiling. Your feet should be flat on the floor. 3. Tighten your lower belly (abdomen) muscles to press your lower back against the floor. This will make your tailbone point up to the ceiling instead of pointing down to your feet or the floor. 4. Stay in this position for 5-10 seconds while you gently tighten your muscles and breathe evenly. Cat-cow Do these steps until your lower back bends more easily: 1. Get on your hands and knees on a firm surface. Keep your hands under your shoulders, and keep your knees under your hips. You may put padding under your knees. 2. Let your head hang down toward your chest. Tighten (contract) the muscles in your belly. Point your tailbone toward the floor so your lower back becomes rounded like the back of a cat. 3. Stay in this position for 5 seconds. 4. Slowly lift your head. Let the muscles of your belly relax. Point your tailbone up toward the ceiling so your back forms a sagging arch like the back of a cow. 5. Stay in this position for 5 seconds.  Press-ups Do these steps 5-10 times in a row: 1. Lie on your belly (face-down) on the floor. 2. Place your hands near your head, about shoulder-width apart. 3. While you keep your back relaxed and keep your hips on the floor, slowly straighten your arms to raise the top half of your body and lift your shoulders. Do not use your back muscles. You may change where you place your hands in order to make yourself more  comfortable. 4. Stay in this position for 5 seconds. 5. Slowly return to lying flat on the floor.  Bridges Do these steps 10 times in a row: 1. Lie on your back on a firm surface. 2. Bend your knees so they point up to the ceiling. Your feet should be flat on the floor. Your arms should be flat at your sides, next to your body. 3. Tighten your butt muscles and lift your butt off the floor until your waist is almost as high as your knees. If you do not feel the muscles working in your butt and the back of your thighs, slide your feet 1-2 inches farther away from your butt. 4. Stay in this position for 3-5 seconds. 5. Slowly lower your butt to the floor, and let your butt muscles relax. If this exercise is too easy, try doing it with your arms crossed over your chest. Belly crunches Do these steps 5-10 times in a row: 1. Lie on  your back on a firm bed or the floor with your legs stretched out. 2. Bend your knees so they point up to the ceiling. Your feet should be flat on the floor. 3. Cross your arms over your chest. 4. Tip your chin a little bit toward your chest but do not bend your neck. 5. Tighten your belly muscles and slowly raise your chest just enough to lift your shoulder blades a tiny bit off of the floor. Avoid raising your body higher than that, because it can put too much stress on your low back. 6. Slowly lower your chest and your head to the floor. Back lifts Do these steps 5-10 times in a row: 1. Lie on your belly (face-down) with your arms at your sides, and rest your forehead on the floor. 2. Tighten the muscles in your legs and your butt. 3. Slowly lift your chest off of the floor while you keep your hips on the floor. Keep the back of your head in line with the curve in your back. Look at the floor while you do this. 4. Stay in this position for 3-5 seconds. 5. Slowly lower your chest and your face to the floor. Contact a doctor if:  Your back pain gets a lot worse  when you do an exercise.  Your back pain does not get better 2 hours after you exercise. If you have any of these problems, stop doing the exercises. Do not do them again unless your doctor says it is okay. Get help right away if:  You have sudden, very bad back pain. If this happens, stop doing the exercises. Do not do them again unless your doctor says it is okay. This information is not intended to replace advice given to you by your health care provider. Make sure you discuss any questions you have with your health care provider. Document Revised: 03/12/2018 Document Reviewed: 03/12/2018 Elsevier Patient Education  2020 [employer]  Neck Exercises Ask your health care provider which exercises are safe for you. Do exercises exactly as told by your health care provider and adjust them as directed. It is normal to feel mild stretching, pulling, tightness, or discomfort as you do these exercises. Stop right away if you feel sudden pain or your pain gets worse. Do not begin these exercises until told by your health care provider. Neck exercises can be important for many reasons. They can improve strength and maintain flexibility in your neck, which will help your upper back and prevent neck pain. Stretching exercises Rotation neck stretching  1. Sit in a chair or stand up. 2. Place your feet flat on the floor, shoulder width apart. 3. Slowly turn your head (rotate) to the right until a slight stretch is felt. Turn it all the way to the right so you can look over your right shoulder. Do not tilt or tip your head. 4. Hold this position for 10-30 seconds. 5. Slowly turn your head (rotate) to the left until a slight stretch is felt. Turn it all the way to the left so you can look over your left shoulder. Do not tilt or tip your head. 6. Hold this position for 10-30 seconds. Repeat __________ times. Complete this exercise __________ times a day. Neck retraction 1. Sit in a sturdy chair or stand  up. 2. Look straight ahead. Do not bend your neck. 3. Use your fingers to push your chin backward (retraction). Do not bend your neck for this movement. Continue to face straight  ahead. If you are doing the exercise properly, you will feel a slight sensation in your throat and a stretch at the back of your neck. 4. Hold the stretch for 1-2 seconds. Repeat __________ times. Complete this exercise __________ times a day. Strengthening exercises Neck press 1. Lie on your back on a firm bed or on the floor with a pillow under your head. 2. Use your neck muscles to push your head down on the pillow and straighten your spine. 3. Hold the position as well as you can. Keep your head facing up (in a neutral position) and your chin tucked. 4. Slowly count to 5 while holding this position. Repeat __________ times. Complete this exercise __________ times a day. Isometrics These are exercises in which you strengthen the muscles in your neck while keeping your neck still (isometrics). 1. Sit in a supportive chair and place your hand on your forehead. 2. Keep your head and face facing straight ahead. Do not flex or extend your neck while doing isometrics. 3. Push forward with your head and neck while pushing back with your hand. Hold for 10 seconds. 4. Do the sequence again, this time putting your hand against the back of your head. Use your head and neck to push backward against the hand pressure. 5. Finally, do the same exercise on either side of your head, pushing sideways against the pressure of your hand. Repeat __________ times. Complete this exercise __________ times a day. Prone head lifts 1. Lie face-down (prone position), resting on your elbows so that your chest and upper back are raised. 2. Start with your head facing downward, near your chest. Position your chin either on or near your chest. 3. Slowly lift your head upward. Lift until you are looking straight ahead. Then continue lifting your  head as far back as you can comfortably stretch. 4. Hold your head up for 5 seconds. Then slowly lower it to your starting position. Repeat __________ times. Complete this exercise __________ times a day. Supine head lifts 1. Lie on your back (supine position), bending your knees to point to the ceiling and keeping your feet flat on the floor. 2. Lift your head slowly off the floor, raising your chin toward your chest. 3. Hold for 5 seconds. Repeat __________ times. Complete this exercise __________ times a day. Scapular retraction 1. Stand with your arms at your sides. Look straight ahead. 2. Slowly pull both shoulders (scapulae) backward and downward (retraction) until you feel a stretch between your shoulder blades in your upper back. 3. Hold for 10-30 seconds. 4. Relax and repeat. Repeat __________ times. Complete this exercise __________ times a day. Contact a health care provider if:  Your neck pain or discomfort gets much worse when you do an exercise.  Your neck pain or discomfort does not improve within 2 hours after you exercise. If you have any of these problems, stop exercising right away. Do not do the exercises again unless your health care provider says that you can. Get help right away if:  You develop sudden, severe neck pain. If this happens, stop exercising right away. Do not do the exercises again unless your health care provider says that you can. This information is not intended to replace advice given to you by your health care provider. Make sure you discuss any questions you have with your health care provider. Document Revised: 04/15/2018 Document Reviewed: 04/15/2018 Elsevier Patient Education  2020 [employer].

## 2020-03-23 ENCOUNTER — Ambulatory Visit: Payer: Medicare Other | Admitting: Orthopaedic Surgery

## 2020-03-24 ENCOUNTER — Telehealth: Payer: Self-pay

## 2020-03-24 NOTE — Telephone Encounter (Signed)
I tried to call patient. No answer. She came in yesterday for an appointment at 2:30pm with Dr.Whitfield. Dr.Whitfield is on vacation this week and I was told that all patients had been contacted or messaged about moving their appointments. I left messaged apologizing for the confusion. I explained that she can call me back and I will work her in with Dr.Whitfield on whatever day is convenient for her. I apologized again and ask her to call me back.

## 2020-04-26 ENCOUNTER — Telehealth: Payer: Self-pay | Admitting: Pharmacy Technician

## 2020-04-26 ENCOUNTER — Other Ambulatory Visit: Payer: Self-pay

## 2020-04-26 DIAGNOSIS — Z79899 Other long term (current) drug therapy: Secondary | ICD-10-CM

## 2020-04-26 NOTE — Telephone Encounter (Signed)
Submitted Patient Assistance Application to [employer] for HUMIRA along with provider portion and income documents. Will update patient when we receive a response.  Fax# [phone] Phone# [phone]

## 2020-04-27 LAB — CBC WITH DIFFERENTIAL/PLATELET
Absolute Monocytes: 575 cells/uL (ref 200–950)
Basophils Absolute: 50 cells/uL (ref 0–200)
Basophils Relative: 0.7 %
Eosinophils Absolute: 873 cells/uL — ABNORMAL HIGH (ref 15–500)
Eosinophils Relative: 12.3 %
HCT: 40.3 % (ref 35.0–45.0)
Hemoglobin: 13.4 g/dL (ref 11.7–15.5)
Lymphs Abs: 1889 cells/uL (ref 850–3900)
MCH: 29.2 pg (ref 27.0–33.0)
MCHC: 33.3 g/dL (ref 32.0–36.0)
MCV: 87.8 fL (ref 80.0–100.0)
MPV: 10.4 fL (ref 7.5–12.5)
Monocytes Relative: 8.1 %
Neutro Abs: 3713 cells/uL (ref 1500–7800)
Neutrophils Relative %: 52.3 %
Platelets: 327 10*3/uL (ref 140–400)
RBC: 4.59 10*6/uL (ref 3.80–5.10)
RDW: 11.8 % (ref 11.0–15.0)
Total Lymphocyte: 26.6 %
WBC: 7.1 10*3/uL (ref 3.8–10.8)

## 2020-04-27 LAB — COMPLETE METABOLIC PANEL WITH GFR
AG Ratio: 1.1 (calc) (ref 1.0–2.5)
ALT: 13 U/L (ref 6–29)
AST: 20 U/L (ref 10–35)
Albumin: 3.9 g/dL (ref 3.6–5.1)
Alkaline phosphatase (APISO): 106 U/L (ref 37–153)
BUN/Creatinine Ratio: 23 (calc) — ABNORMAL HIGH (ref 6–22)
BUN: 22 mg/dL (ref 7–25)
CO2: 30 mmol/L (ref 20–32)
Calcium: 10 mg/dL (ref 8.6–10.4)
Chloride: 101 mmol/L (ref 98–110)
Creat: 0.94 mg/dL — ABNORMAL HIGH (ref 0.60–0.93)
GFR, Est African American: 71 mL/min/{1.73_m2} (ref >=60)
GFR, Est Non African American: 61 mL/min/{1.73_m2} (ref >=60)
Globulin: 3.7 g/dL (calc) (ref 1.9–3.7)
Glucose, Bld: 90 mg/dL (ref 65–99)
Potassium: 4.1 mmol/L (ref 3.5–5.3)
Sodium: 138 mmol/L (ref 135–146)
Total Bilirubin: 0.6 mg/dL (ref 0.2–1.2)
Total Protein: 7.6 g/dL (ref 6.1–8.1)

## 2020-04-27 NOTE — Progress Notes (Signed)
Absolute eosinophils are elevated and trending up.  WBC count is WNL.  Please ask the patient if she has been experiencing any seasonal allergies recently.  Please advise the patient to follow up with PCP to further discuss if so.    Creatinine is borderline elevated.  Please advise the patient to increase her hydration and to avoid taking NSAIDs.  We will continue to monitor lab work.

## 2020-05-12 NOTE — Telephone Encounter (Signed)
Received approval for Patient Assistance from [facility] [employer] for Humira. Patient has been approved for assistance through 06/30/21.  Phone number is: [phone]

## 2020-08-16 ENCOUNTER — Telehealth: Payer: Self-pay | Admitting: Pharmacist

## 2020-08-16 DIAGNOSIS — Z79899 Other long term (current) drug therapy: Secondary | ICD-10-CM

## 2020-08-16 NOTE — Addendum Note (Signed)
Addended by: Murrell Redden on: 08/16/2020 09:40 AM   Modules accepted: Orders

## 2020-08-16 NOTE — Telephone Encounter (Signed)
Patient returned call. Advised that she is due for labs since she is taking Humira. Patient plans to stop by Friday, 08/18/20 for labs. Standing orders for CBC with diff/platelet and CMP with GFR placed today.  Chesley Mires, PharmD, MPH Clinical Pharmacist (Rheumatology and Pulmonology)

## 2020-08-16 NOTE — Telephone Encounter (Signed)
Received refill request from [employer] for patient's Humira. Patient's last CBC and CMP were completed 04/26/20. Patient is overdue for CBC/CMP.  TB gold not yet due until 01/12/21  Left VM and advised to stop by for labs.  Chesley Mires, PharmD, MPH Clinical Pharmacist (Rheumatology and Pulmonology)

## 2020-08-18 ENCOUNTER — Telehealth: Payer: Self-pay

## 2020-08-18 NOTE — Telephone Encounter (Signed)
Patient left a voicemail stating she had labwork on 06/09/20 and is asking if she needs to have them repeated again.  Patient requested a return call.

## 2020-08-18 NOTE — Telephone Encounter (Signed)
Attempted to contact patient and left message to advise patient the last labs we have on file are from October 2021 and she is due to update labs.

## 2020-08-21 ENCOUNTER — Other Ambulatory Visit: Payer: Self-pay | Admitting: Rheumatology

## 2020-08-21 NOTE — Telephone Encounter (Signed)
Patient came by to drop off December lab draw results. Patient has an upcoming office visit 09/11/20, and was advised she would be due for repeat labs then. Patient understood, and requested a refill on her Humira to be sent into [employer].

## 2020-08-22 MED ORDER — HUMIRA (2 PEN) 40 MG/0.4ML ~~LOC~~ AJKT
40.0000 mg | AUTO-INJECTOR | SUBCUTANEOUS | 0 refills | Status: DC
Start: 2020-08-22 — End: 2020-11-21

## 2020-08-22 NOTE — Telephone Encounter (Signed)
Last Visit: 03/13/2020 Next Visit: 09/11/2020 Labs: 06/09/2020 EO% 8.1 CA corrected 10.41  TB Gold: 01/13/2020 Neg    Current Dose per office note 03/13/2020: Humira 40 mg sq injection every 14 days  DX: Rheumatoid arthritis with positive rheumatoid factor  Last Fill: 03/13/2020  Okay to refill Humira?

## 2020-08-23 ENCOUNTER — Telehealth: Payer: Self-pay | Admitting: *Deleted

## 2020-08-23 NOTE — Telephone Encounter (Signed)
Labs received from:[facility] on:06/09/2020 Reviewed by:Sherron Ales, PA-C  Labs drawn:CBC w/Diff, CMP  Results:EO% 8.1 CA corrected 10.41 Cholesterol 245 Chol/HDL 5.2 NHDL 198 LDL Chol Calc 178

## 2020-08-28 NOTE — Progress Notes (Signed)
Office Visit Note  Patient: Diana [name]             Date of Birth: 23-Mar-1951           MRN: [id number]             PCP: Renford Dills, MD Referring: Renford Dills, MD Visit Date: 09/11/2020 Occupation: @GUAROCC @  Subjective:  Medication monitoring   History of Present Illness: Diana [name] is a 70 y.o. female with history of seropositive rheumatoid arthritis.  Patient is currently on Humira 40 mg subcutaneous injections every 14 days.  She has not missed any doses recently.  She denies any recent rheumatoid arthritis flares.  She denies any joint pain or joint swelling at this time.  She has not noticed any morning stiffness or nocturnal pain. She states bilateral knee replacements are doing well.  She denies any fatigue.  She has been sleeping well at night.  She denies any recent infections.     Activities of Daily Living:  Patient reports morning stiffness for 0 minutes.   Patient Denies nocturnal pain.  Difficulty dressing/grooming: Denies Difficulty climbing stairs: Denies Difficulty getting out of chair: Denies Difficulty using hands for taps, buttons, cutlery, and/or writing: Denies  Review of Systems  Constitutional: Negative for fatigue.  HENT: Negative for mouth sores, mouth dryness and nose dryness.   Eyes: Negative for pain, itching and dryness.  Respiratory: Negative for shortness of breath and difficulty breathing.   Cardiovascular: Positive for palpitations. Negative for chest pain.  Gastrointestinal: Negative for blood in stool, constipation and diarrhea.  Endocrine: Negative for increased urination.  Genitourinary: Negative for difficulty urinating.  Musculoskeletal: Negative for arthralgias, joint pain, joint swelling, myalgias, morning stiffness, muscle tenderness and myalgias.  Skin: Negative for color change, rash and redness.  Allergic/Immunologic: Negative for susceptible to infections.  Neurological: Negative for dizziness, numbness, headaches,  memory loss and weakness.  Hematological: Negative for bruising/bleeding tendency.  Psychiatric/Behavioral: Negative for confusion.    PMFS History:  Patient Active Problem List   Diagnosis Date Noted  . H/O total knee replacement, left 08/18/2019  . Osteoarthritis of left knee 06/08/2019  . Unilateral primary osteoarthritis, left knee 05/25/2019  . History of total right knee replacement 02/02/2019  . Rheumatoid arthritis with positive rheumatoid factor  05/14/2016  . High risk medication use 05/14/2016  . Primary osteoarthritis of both hands 05/14/2016  . G6PD deficiency 05/14/2016  . S/p nephrectomy 05/14/2016  . Essential hypertension 05/14/2016  . Depression 05/14/2016  . Sleep apnea 05/14/2016  . Former smoker 05/14/2016  . History of ankle fracture 05/14/2016  . Acute medial meniscus tear of right knee 10/08/2011    Class: Acute  . Osteoarthritis of right knee 10/08/2011    Class: Chronic    Past Medical History:  Diagnosis Date  . Depression   . Hyperlipidemia   . Hypertension   . Kidney disease    "my one kidney doesnt give me problems"   . Positive PPD    works 12/08/2011  . Rheumatoid arthritis(714.0)   . Sleep apnea    uses cpap-mod-severe; i used to but then i got tired of it     History reviewed. No pertinent family history. Past Surgical History:  Procedure Laterality Date  . ANKLE ARTHRODESIS  2010   left  . BREAST LUMPECTOMY  1998   rt  . BREAST SURGERY     rt breast bx  . CERVICAL BIOPSY    . KIDNEY SURGERY    .  KNEE ARTHROPLASTY    . NEPHRECTOMY  2007   right  . TOTAL KNEE ARTHROPLASTY Right 02/02/2019   Procedure: RIGHT TOTAL KNEE ARTHROPLASTY;  Surgeon: Valeria Batman, MD;  Location: [facility];  Service: Orthopedics;  Laterality: Right;  . TOTAL KNEE ARTHROPLASTY Left 06/08/2019   Procedure: LEFT TOTAL KNEE ARTHROPLASTY;  Surgeon: Valeria Batman, MD;  Location: [facility];  Service: Orthopedics;  Laterality: Left;  . TUBAL LIGATION      Social History   Social History Narrative  . Not on file   Immunization History  Administered Date(s) Administered  . Influenza, High Dose Seasonal PF 04/10/2018  . PFIZER(Purple Top)SARS-COV-2 Vaccination 07/16/2019, 08/06/2019, 04/29/2020  . Pneumococcal Polysaccharide-23 04/10/2018     Objective: Vital Signs: BP 129/70 (BP Location: Left Arm, Patient Position: Sitting, Cuff Size: Normal)   Pulse 75   Resp 14   Ht 5' 1.5" (1.562 m)   Wt 160 lb 6.4 oz (72.8 kg)   BMI 29.82 kg/m    Physical Exam Vitals and nursing note reviewed.  Constitutional:      Appearance: She is well-developed.  HENT:     Head: Normocephalic and atraumatic.  Eyes:     Conjunctiva/sclera: Conjunctivae normal.  Pulmonary:     Effort: Pulmonary effort is normal.  Abdominal:     Palpations: Abdomen is soft.  Musculoskeletal:     Cervical back: Normal range of motion.  Skin:    General: Skin is warm and dry.     Capillary Refill: Capillary refill takes less than 2 seconds.  Neurological:     Mental Status: She is alert and oriented to person, place, and time.  Psychiatric:        Behavior: Behavior normal.      Musculoskeletal Exam: C-spine, thoracic spine, and lumbar spine good ROM.  Shoulder joints, elbow joints, wrist joints, and MCPs good ROM with no synovitis.  Hyperextension of PIP and hyperflexion of DIP joints.  Complete fist formation bilaterally.  Hip joints good ROM with no discomfort.  Bilateral knee replacements have good ROM with warmth but no effusion. Ankle joints good ROM with no tenderness or swelling.   CDAI Exam: CDAI Score: 0.6  Patient Global: 3 mm; Provider Global: 3 mm Swollen: 0 ; Tender: 0  Joint Exam 09/11/2020   No joint exam has been documented for this visit   There is currently no information documented on the homunculus. Go to the Rheumatology activity and complete the homunculus joint exam.  Investigation: No additional findings.  Imaging: No results  found.  Recent Labs: Lab Results  Component Value Date   WBC 7.1 04/26/2020   HGB 13.4 04/26/2020   PLT 327 04/26/2020   NA 138 04/26/2020   K 4.1 04/26/2020   CL 101 04/26/2020   CO2 30 04/26/2020   GLUCOSE 90 04/26/2020   BUN 22 04/26/2020   CREATININE 0.94 (H) 04/26/2020   BILITOT 0.6 04/26/2020   ALKPHOS 99 06/07/2019   AST 20 04/26/2020   ALT 13 04/26/2020   PROT 7.6 04/26/2020   ALBUMIN 3.7 06/07/2019   CALCIUM 10.0 04/26/2020   GFRAA 71 04/26/2020   QFTBGOLD NEGATIVE 05/07/2017   QFTBGOLDPLUS NEGATIVE 01/13/2020    Speciality Comments: No specialty comments available.  Procedures:  No procedures performed Allergies: Other, Iodine, and Latex   Assessment / Plan:     Visit Diagnoses: Rheumatoid arthritis with positive rheumatoid factor: She has no joint tenderness or synovitis on exam.  She has not had any  recent rheumatoid arthritis flares.  She is clinically doing well on Humira 40 mg subcutaneous injections every 14 days.  She has not missed any doses of Humira recently.  She is not experiencing any joint pain, morning stiffness, or nocturnal pain.  She has not had any difficulty with ADLs.  She will continue on Humira as prescribed.  She does not need any refills at this time.  She was advised to notify us if she develops increased joint pain or joint swelling.  She will follow-up in the office in 5 months.  High risk medication use - Humira 40 mg sq injection every 14 days. CBC and CMP were drawn on 04/26/2020.  She is overdue to update lab work.  Orders for CBC and CMP were released.  Her next lab work will be due in June and every 3 months to monitor for drug toxicity.  Standing orders for CBC and CMP remain in place.  TB gold negative on 01/13/2020 and will continue to be monitored yearly. - Plan: CBC with Differential/Platelet, COMPLETE METABOLIC PANEL WITH GFR She has not had any recent infections.  We discussed the importance of holding Humira if she develops  signs or symptoms of an infection and to resume once the infection has completely cleared. She has received 3 Pfizer COVID-19 vaccine doses.  We discussed that ACR and the CDC recommend a fourth dose 5 to 6 months after the third dose.  She voiced understanding. We discussed the importance of yearly skin exams while on Humira due to increased risk of skin cancer.  Primary osteoarthritis of both hands: She has PIP and DIP thickening consistent with osteoarthritis of both hands.  Hyperextension of PIP and hyperflexion of DIP joints consistent with swan neck deformities.  Complete fist formation bilaterally.  We discussed the importance of joint protection and muscle strengthening.  We discussed the importance of performing hand exercises on a daily basis.  S/P total knee arthroplasty, left - Performed by Dr. Cleophas Dunker on 06/08/2019. Doing well.  She has good ROM with no discomfort.  Warmth but no effusion noted.   S/P total knee replacement, right - Performed by Dr. Cleophas Dunker on 02/02/2019. Doing well.  She has good ROM with no discomfort.  Warmth but no effusion noted.   Neck pain - X-rays of the C-spine were consistent with C5-C6 C6-C7 disc space narrowing and anterior spurring. She has good ROM of the C-spine with no discomfort.   Chronic midline low back pain without sciatica - X-rays of the lumbar spine were consistent with facet joint arthritis. She is not experiencing any lower back pain at this time.  She has no symptoms of radiculopathy.  No midline spinal tenderness.   Other medical conditions are listed as follows:   G6PD deficiency  History of ankle fracture  History of depression  History of hypertension  History of sleep apnea  History of nephrectomy  Ex-smoker  Orders: Orders Placed This Encounter  Procedures  . CBC with Differential/Platelet  . COMPLETE METABOLIC PANEL WITH GFR   No orders of the defined types were placed in this encounter.    Follow-Up  Instructions: Return in about 5 months (around 02/11/2021) for Rheumatoid arthritis.   Gearldine Bienenstock, PA-C  Note - This record has been created using Dragon software.  Chart creation errors have been sought, but may not always  have been located. Such creation errors do not reflect on  the standard of medical care.

## 2020-09-11 ENCOUNTER — Other Ambulatory Visit: Payer: Self-pay

## 2020-09-11 ENCOUNTER — Ambulatory Visit: Payer: Medicare Other | Admitting: Physician Assistant

## 2020-09-11 ENCOUNTER — Encounter: Payer: Self-pay | Admitting: Physician Assistant

## 2020-09-11 VITALS — BP 129/70 | HR 75 | Resp 14 | Ht 61.5 in | Wt 160.4 lb

## 2020-09-11 DIAGNOSIS — I10 Essential (primary) hypertension: Secondary | ICD-10-CM | POA: Diagnosis not present

## 2020-09-11 DIAGNOSIS — Z8659 Personal history of other mental and behavioral disorders: Secondary | ICD-10-CM

## 2020-09-11 DIAGNOSIS — Z905 Acquired absence of kidney: Secondary | ICD-10-CM

## 2020-09-11 DIAGNOSIS — M0579 Rheumatoid arthritis with rheumatoid factor of multiple sites without organ or systems involvement: Secondary | ICD-10-CM | POA: Diagnosis not present

## 2020-09-11 DIAGNOSIS — Z8669 Personal history of other diseases of the nervous system and sense organs: Secondary | ICD-10-CM

## 2020-09-11 DIAGNOSIS — M542 Cervicalgia: Secondary | ICD-10-CM

## 2020-09-11 DIAGNOSIS — M069 Rheumatoid arthritis, unspecified: Secondary | ICD-10-CM | POA: Diagnosis not present

## 2020-09-11 DIAGNOSIS — Z96651 Presence of right artificial knee joint: Secondary | ICD-10-CM

## 2020-09-11 DIAGNOSIS — D75A Glucose-6-phosphate dehydrogenase (G6PD) deficiency without anemia: Secondary | ICD-10-CM | POA: Diagnosis not present

## 2020-09-11 DIAGNOSIS — Z96652 Presence of left artificial knee joint: Secondary | ICD-10-CM | POA: Diagnosis not present

## 2020-09-11 DIAGNOSIS — M19041 Primary osteoarthritis, right hand: Secondary | ICD-10-CM

## 2020-09-11 DIAGNOSIS — M545 Low back pain, unspecified: Secondary | ICD-10-CM | POA: Diagnosis not present

## 2020-09-11 DIAGNOSIS — E7800 Pure hypercholesterolemia, unspecified: Secondary | ICD-10-CM | POA: Diagnosis not present

## 2020-09-11 DIAGNOSIS — Z8781 Personal history of (healed) traumatic fracture: Secondary | ICD-10-CM

## 2020-09-11 DIAGNOSIS — Z79899 Other long term (current) drug therapy: Secondary | ICD-10-CM | POA: Diagnosis not present

## 2020-09-11 DIAGNOSIS — Z87891 Personal history of nicotine dependence: Secondary | ICD-10-CM

## 2020-09-11 DIAGNOSIS — M19042 Primary osteoarthritis, left hand: Secondary | ICD-10-CM

## 2020-09-11 DIAGNOSIS — Z8679 Personal history of other diseases of the circulatory system: Secondary | ICD-10-CM | POA: Diagnosis not present

## 2020-09-11 DIAGNOSIS — G8929 Other chronic pain: Secondary | ICD-10-CM

## 2020-09-11 NOTE — Patient Instructions (Signed)
Standing Labs We placed an order today for your standing lab work.   Please have your standing labs drawn in June and every 3 months   If possible, please have your labs drawn 2 weeks prior to your appointment so that the provider can discuss your results at your appointment.  We have open lab daily Monday through Thursday from 1:30-4:30 PM and Friday from 1:30-4:00 PM at the office of Dr. Shaili Deveshwar, [facility].   Please be advised, all patients with office appointments requiring lab work will take precedents over walk-in lab work.  If possible, please come for your lab work on Monday and Friday afternoons, as you may experience shorter wait times. The office is located at [address], [address], [place], [place] [address] No appointment is necessary.   Labs are drawn by [facility]. Please bring your co-pay at the time of your lab draw.  You may receive a bill from [facility] for your lab work.  If you wish to have your labs drawn at another location, please call the office 24 hours in advance to send orders.  If you have any questions regarding directions or hours of operation,  please call [phone].   As a reminder, please drink plenty of water prior to coming for your lab work. Thanks!   

## 2020-09-12 LAB — COMPLETE METABOLIC PANEL WITH GFR
AG Ratio: 1.2 (calc) (ref 1.0–2.5)
ALT: 16 U/L (ref 6–29)
AST: 23 U/L (ref 10–35)
Albumin: 3.9 g/dL (ref 3.6–5.1)
Alkaline phosphatase (APISO): 94 U/L (ref 37–153)
BUN: 20 mg/dL (ref 7–25)
CO2: 32 mmol/L (ref 20–32)
Calcium: 9.7 mg/dL (ref 8.6–10.4)
Chloride: 101 mmol/L (ref 98–110)
Creat: 0.86 mg/dL (ref 0.60–0.93)
GFR, Est African American: 79 mL/min/{1.73_m2} (ref >=60)
GFR, Est Non African American: 68 mL/min/{1.73_m2} (ref >=60)
Globulin: 3.2 g/dL (calc) (ref 1.9–3.7)
Glucose, Bld: 81 mg/dL (ref 65–99)
Potassium: 4.3 mmol/L (ref 3.5–5.3)
Sodium: 140 mmol/L (ref 135–146)
Total Bilirubin: 0.5 mg/dL (ref 0.2–1.2)
Total Protein: 7.1 g/dL (ref 6.1–8.1)

## 2020-09-12 LAB — CBC WITH DIFFERENTIAL/PLATELET
Absolute Monocytes: 614 cells/uL (ref 200–950)
Basophils Absolute: 59 cells/uL (ref 0–200)
Basophils Relative: 0.8 %
Eosinophils Absolute: 940 cells/uL — ABNORMAL HIGH (ref 15–500)
Eosinophils Relative: 12.7 %
HCT: 41.2 % (ref 35.0–45.0)
Hemoglobin: 13.4 g/dL (ref 11.7–15.5)
Lymphs Abs: 1754 cells/uL (ref 850–3900)
MCH: 28.8 pg (ref 27.0–33.0)
MCHC: 32.5 g/dL (ref 32.0–36.0)
MCV: 88.6 fL (ref 80.0–100.0)
MPV: 10.5 fL (ref 7.5–12.5)
Monocytes Relative: 8.3 %
Neutro Abs: 4033 cells/uL (ref 1500–7800)
Neutrophils Relative %: 54.5 %
Platelets: 339 10*3/uL (ref 140–400)
RBC: 4.65 10*6/uL (ref 3.80–5.10)
RDW: 11.8 % (ref 11.0–15.0)
Total Lymphocyte: 23.7 %
WBC: 7.4 10*3/uL (ref 3.8–10.8)

## 2020-09-12 NOTE — Progress Notes (Signed)
Absolute eosinophils-940. Rest of CBC WNL.  CMP WNL.

## 2020-09-19 DIAGNOSIS — R6889 Other general symptoms and signs: Secondary | ICD-10-CM | POA: Diagnosis not present

## 2020-09-19 DIAGNOSIS — I739 Peripheral vascular disease, unspecified: Secondary | ICD-10-CM | POA: Diagnosis not present

## 2020-09-20 ENCOUNTER — Other Ambulatory Visit: Payer: Self-pay | Admitting: Internal Medicine

## 2020-09-20 DIAGNOSIS — R6889 Other general symptoms and signs: Secondary | ICD-10-CM

## 2020-09-26 ENCOUNTER — Ambulatory Visit
Admission: RE | Admit: 2020-09-26 | Discharge: 2020-09-26 | Disposition: A | Discharge status: Home / Self Care | Payer: Medicare Other | Source: Ambulatory Visit | Attending: Internal Medicine | Admitting: Internal Medicine

## 2020-09-26 DIAGNOSIS — E785 Hyperlipidemia, unspecified: Secondary | ICD-10-CM | POA: Diagnosis not present

## 2020-09-26 DIAGNOSIS — R6889 Other general symptoms and signs: Secondary | ICD-10-CM

## 2020-11-08 DIAGNOSIS — M8588 Other specified disorders of bone density and structure, other site: Secondary | ICD-10-CM | POA: Diagnosis not present

## 2020-11-21 ENCOUNTER — Other Ambulatory Visit: Payer: Self-pay | Admitting: *Deleted

## 2020-11-21 MED ORDER — HUMIRA (2 PEN) 40 MG/0.4ML ~~LOC~~ AJKT: 40.0000 mg | AUTO-INJECTOR | SUBCUTANEOUS | 0 refills | Status: DC

## 2020-11-21 NOTE — Telephone Encounter (Signed)
Next Visit:02/14/2021  Last Visit: 09/11/2020  Last Fill: 08/22/2020  DX: Rheumatoid arthritis with positive rheumatoid factor  Current Dose per office note 09/11/2020, Humira 40 mg sq injection every 14 days  Labs: 09/11/2020, Absolute eosinophils-940. Rest of CBC WNL. CMP WNL.  TB Gold: 01/12/2021, negative  Okay to refill Humira?

## 2020-11-26 IMAGING — CR CHEST - 2 VIEW
2 series · 2 of 2 positions shown · non-contrast
Comparison: February 13, 2015.

CLINICAL DATA: Preop total knee replacement

EXAM:
CHEST - 2 VIEW

[w chest pa]
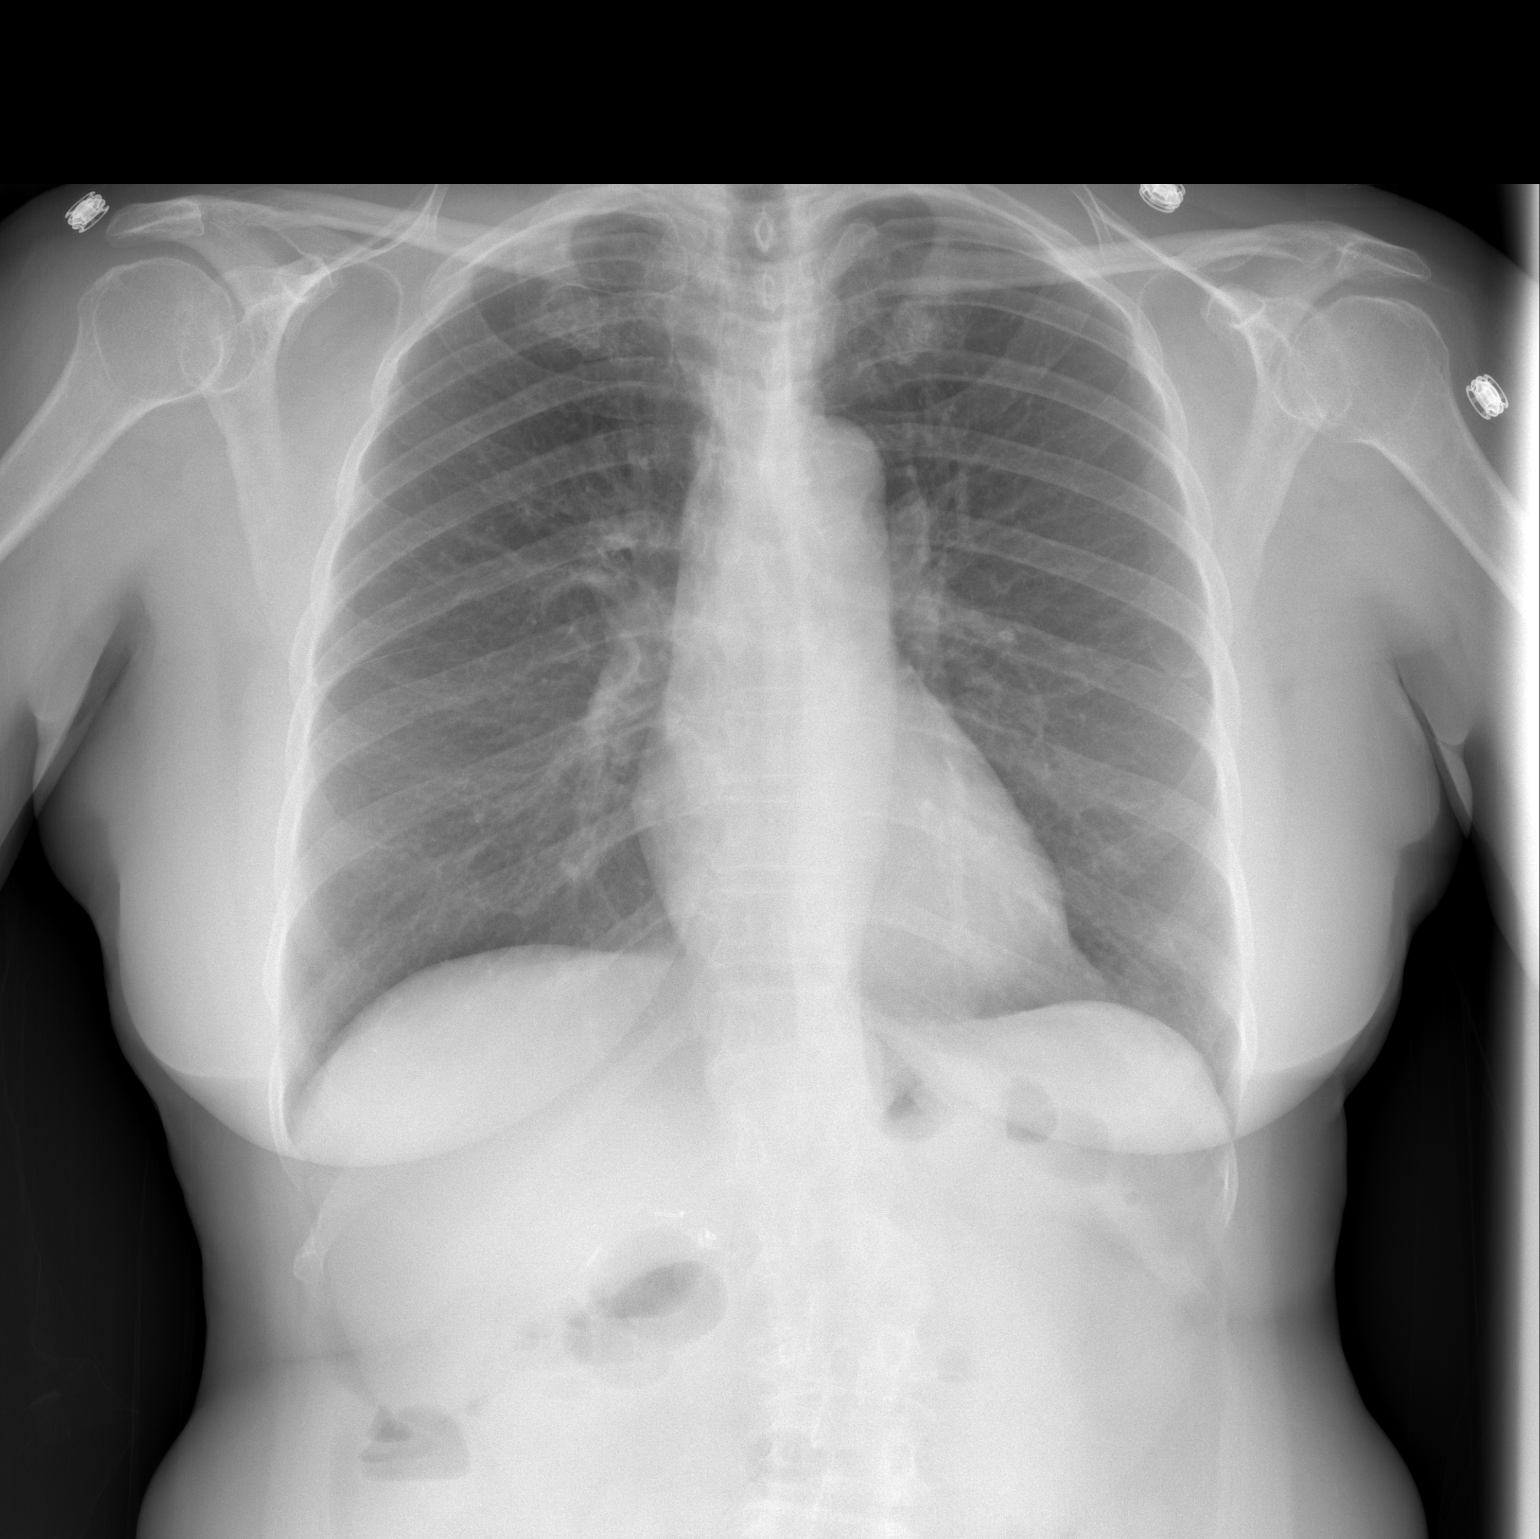

[w chest lat]
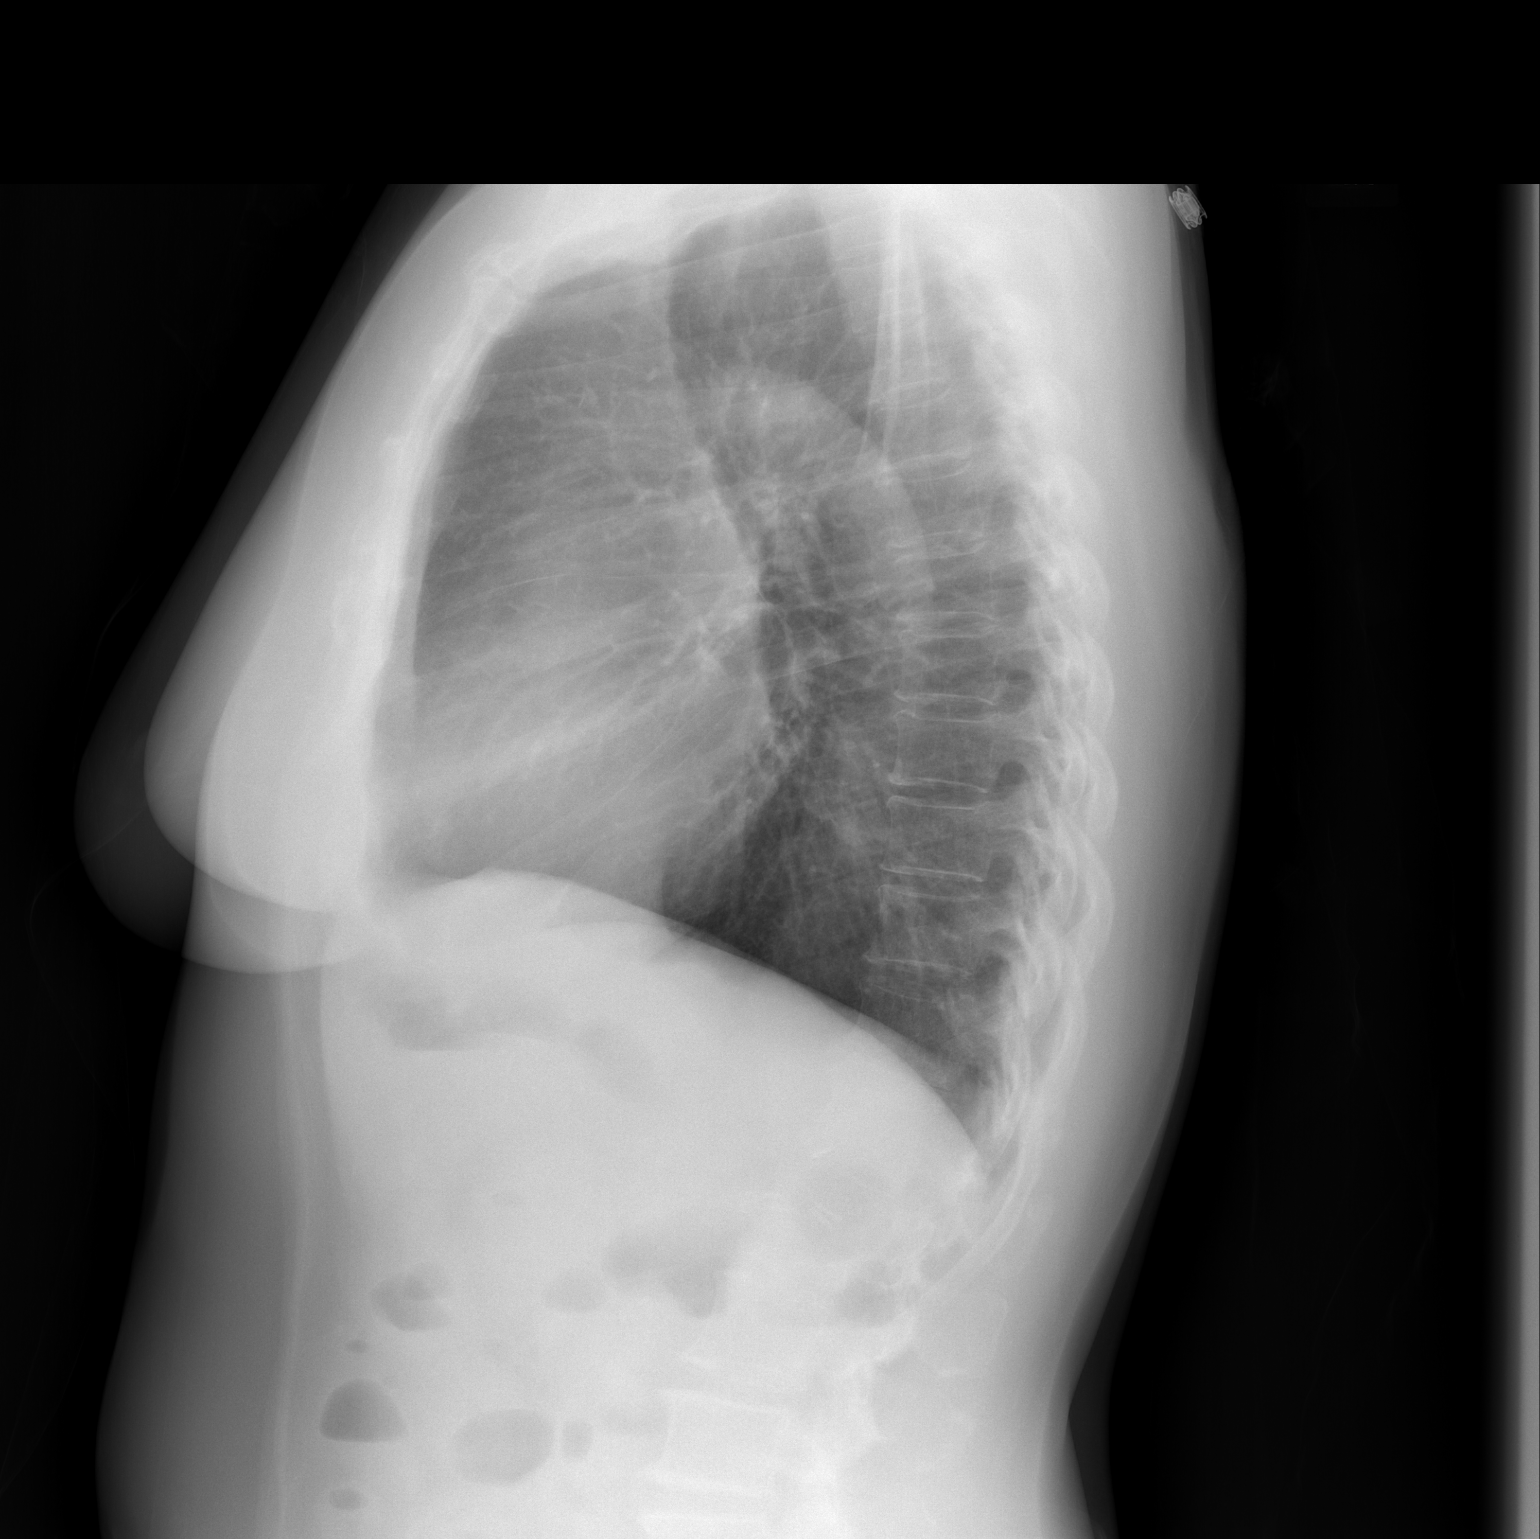

[2 of 2 positions shown; findings below may reference images not displayed]

FINDINGS: The heart size and mediastinal contours are within normal limits.
Both lungs are clear. Degenerative changes seen in the midthoracic
spine. No acute osseous findings.
IMPRESSION: No active cardiopulmonary disease.

## 2020-12-05 ENCOUNTER — Telehealth: Payer: Self-pay | Admitting: Rheumatology

## 2020-12-05 NOTE — Telephone Encounter (Signed)
Opened in error

## 2021-01-31 NOTE — Progress Notes (Signed)
Office Visit Note  Patient: Diana [name]             Date of Birth: 1950-08-18           MRN: [id number]             PCP: Renford Dills, MD Referring: Renford Dills, MD Visit Date: 02/14/2021 Occupation: @GUAROCC @  Subjective:  Medication management   History of Present Illness: Diana [name] is a 70 y.o. female with a history of rheumatoid arthritis.  She denies any arthritis flares.  She states she continues to have some stiffness in her knee joints which are replaced.  None of the other joints are painful.  She has been tolerating Humira well.  Activities of Daily Living:  Patient reports morning stiffness for 0 minutes.   Patient Denies nocturnal pain.  Difficulty dressing/grooming: Denies Difficulty climbing stairs: Denies Difficulty getting out of chair: Denies Difficulty using hands for taps, buttons, cutlery, and/or writing: Denies  Review of Systems  Constitutional:  Negative for fatigue.  HENT:  Negative for mouth sores, mouth dryness and nose dryness.   Eyes:  Negative for pain, itching and dryness.  Respiratory:  Negative for shortness of breath and difficulty breathing.   Cardiovascular:  Negative for chest pain and palpitations.  Gastrointestinal:  Negative for blood in stool, constipation and diarrhea.  Endocrine: Negative for increased urination.  Genitourinary:  Negative for difficulty urinating.  Musculoskeletal:  Negative for joint pain, joint pain, joint swelling, myalgias, morning stiffness, muscle tenderness and myalgias.  Skin:  Negative for color change, rash and redness.  Allergic/Immunologic: Negative for susceptible to infections.  Neurological:  Negative for dizziness, numbness, headaches, memory loss and weakness.  Hematological:  Negative for bruising/bleeding tendency.  Psychiatric/Behavioral:  Negative for confusion.    PMFS History:  Patient Active Problem List   Diagnosis Date Noted   H/O total knee replacement, left 08/18/2019    Osteoarthritis of left knee 06/08/2019   Unilateral primary osteoarthritis, left knee 05/25/2019   History of total right knee replacement 02/02/2019   Rheumatoid arthritis with positive rheumatoid factor  05/14/2016   High risk medication use 05/14/2016   Primary osteoarthritis of both hands 05/14/2016   G6PD deficiency 05/14/2016   S/p nephrectomy 05/14/2016   Essential hypertension 05/14/2016   Depression 05/14/2016   Sleep apnea 05/14/2016   Former smoker 05/14/2016   History of ankle fracture 05/14/2016   Acute medial meniscus tear of right knee 10/08/2011    Class: Acute   Osteoarthritis of right knee 10/08/2011    Class: Chronic    Past Medical History:  Diagnosis Date   Depression    Hyperlipidemia    Hypertension    Kidney disease    "my one kidney doesnt give me problems"    Positive PPD    works health dept   Rheumatoid arthritis(714.0)    Sleep apnea    uses cpap-mod-severe; i used to but then i got tired of it     History reviewed. No pertinent family history. Past Surgical History:  Procedure Laterality Date   ANKLE ARTHRODESIS  2010   left   BREAST LUMPECTOMY  1998   rt   BREAST SURGERY     rt breast bx   CERVICAL BIOPSY     KIDNEY SURGERY     KNEE ARTHROPLASTY     NEPHRECTOMY  2007   right   TOTAL KNEE ARTHROPLASTY Right 02/02/2019   Procedure: RIGHT TOTAL KNEE ARTHROPLASTY;  Surgeon: 04/04/2019  W, MD;  Location: [facility];  Service: Orthopedics;  Laterality: Right;   TOTAL KNEE ARTHROPLASTY Left 06/08/2019   Procedure: LEFT TOTAL KNEE ARTHROPLASTY;  Surgeon: Valeria Batman, MD;  Location: [facility];  Service: Orthopedics;  Laterality: Left;   TUBAL LIGATION     Social History   Social History Narrative   Not on file   Immunization History  Administered Date(s) Administered   Influenza, High Dose Seasonal PF 04/10/2018   PFIZER(Purple Top)SARS-COV-2 Vaccination 07/16/2019, 08/06/2019, 04/29/2020   Pneumococcal Polysaccharide-23 04/10/2018      Objective: Vital Signs: BP 135/75 (BP Location: Left Arm, Patient Position: Sitting, Cuff Size: Normal)   Pulse 73   Resp 15   Ht 5' 1.5" (1.562 m)   Wt 160 lb 6.4 oz (72.8 kg)   BMI 29.82 kg/m    Physical Exam Vitals and nursing note reviewed.  Constitutional:      Appearance: She is well-developed.  HENT:     Head: Normocephalic and atraumatic.  Eyes:     Conjunctiva/sclera: Conjunctivae normal.  Cardiovascular:     Rate and Rhythm: Normal rate and regular rhythm.     Heart sounds: Normal heart sounds.  Pulmonary:     Effort: Pulmonary effort is normal.     Breath sounds: Normal breath sounds.  Abdominal:     General: Bowel sounds are normal.     Palpations: Abdomen is soft.  Musculoskeletal:     Cervical back: Normal range of motion.  Lymphadenopathy:     Cervical: No cervical adenopathy.  Skin:    General: Skin is warm and dry.     Capillary Refill: Capillary refill takes less than 2 seconds.  Neurological:     Mental Status: She is alert and oriented to person, place, and time.  Psychiatric:        Behavior: Behavior normal.     Musculoskeletal Exam: C-spine was in good range of motion.  Shoulder joints, elbow joints, wrist joints, MCPs PIPs and DIPs with good range of motion.  She had bilateral DIP thickening.  Hip joints and knee joints with good range of motion.  Bilateral knee joints were replaced and warm to touch.  There was no tenderness over ankles or MTPs.  CDAI Exam: CDAI Score: 0  Patient Global: 0 mm; Provider Global: 0 mm Swollen: 0 ; Tender: 0  Joint Exam 02/14/2021   No joint exam has been documented for this visit   There is currently no information documented on the homunculus. Go to the Rheumatology activity and complete the homunculus joint exam.  Investigation: No additional findings.  Imaging: No results found.  Recent Labs: Lab Results  Component Value Date   WBC 7.4 09/11/2020   HGB 13.4 09/11/2020   PLT 339 09/11/2020    NA 140 09/11/2020   K 4.3 09/11/2020   CL 101 09/11/2020   CO2 32 09/11/2020   GLUCOSE 81 09/11/2020   BUN 20 09/11/2020   CREATININE 0.86 09/11/2020   BILITOT 0.5 09/11/2020   ALKPHOS 99 06/07/2019   AST 23 09/11/2020   ALT 16 09/11/2020   PROT 7.1 09/11/2020   ALBUMIN 3.7 06/07/2019   CALCIUM 9.7 09/11/2020   GFRAA 79 09/11/2020   QFTBGOLD NEGATIVE 05/07/2017   QFTBGOLDPLUS NEGATIVE 01/13/2020    Speciality Comments: No specialty comments available.  Procedures:  No procedures performed Allergies: Other, Iodine, and Latex   Assessment / Plan:     Visit Diagnoses: Rheumatoid arthritis with positive rheumatoid factor -patient has been  doing well on Humira 40 mg subcu every 14 days.  She has not had a flare in a long time.  We discussed spacing Humira to every 3 weeks.  If she starts to experience increased pain and discomfort she will go back to every 2 2 weeks dosing.  No warmth swelling or effusion was noted on the examination today.  High risk medication use - Humira 40 mg sq injection every 14 days. - Plan: CBC with Differential/Platelet, COMPLETE METABOLIC PANEL WITH GFR, QuantiFERON-TB Gold Plus today.  CBC and CMP will be obtained every 3 months.  She had COVID-19 infection in June.  She plans to get a booster this fall.  Instructions regarding immunization were placed in the AVS.  She was also advised to stop Humira in case she develops an infection and restart after infection resolves.  She was advised to have annual skin examination to screen for nonmelanoma skin cancer.  Primary osteoarthritis of both hands-she had bilateral DIP thickening.  Joint protection was discussed.  S/P total knee arthroplasty, left - Performed by Dr. Cleophas Dunker on 06/08/2019.  She has some warmth on palpation without discomfort.  S/P total knee replacement, right - Performed by Dr. Cleophas Dunker on 02/02/2019.  Warmth noted on palpation without any discomfort.  History of ankle fracture-she had good  recovery from the fracture.  Neck pain - X-rays of the C-spine were consistent with C5-C6 C6-C7 disc space narrowing and anterior spurring.  She had good range of motion on the examination today.  Chronic midline low back pain without sciatica - X-rays of the lumbar spine were consistent with facet joint arthritis.  G6PD deficiency  History of hypertension-blood pressure is normal today.  Dyslipidemia - Plan: Lipid panel  History of nephrectomy  History of sleep apnea  History of depression  Ex-smoker  COVID-19 virus infection - 11/2020  Orders: Orders Placed This Encounter  Procedures   CBC with Differential/Platelet   COMPLETE METABOLIC PANEL WITH GFR   QuantiFERON-TB Gold Plus   Lipid panel    No orders of the defined types were placed in this encounter.    Follow-Up Instructions: Return in about 3 months (around 05/17/2021) for Rheumatoid arthritis.   Pollyann Savoy, MD  Note - This record has been created using Animal nutritionist.  Chart creation errors have been sought, but may not always  have been located. Such creation errors do not reflect on  the standard of medical care.

## 2021-02-14 ENCOUNTER — Encounter: Payer: Self-pay | Admitting: Rheumatology

## 2021-02-14 ENCOUNTER — Other Ambulatory Visit: Payer: Self-pay

## 2021-02-14 ENCOUNTER — Ambulatory Visit: Payer: Medicare Other | Admitting: Rheumatology

## 2021-02-14 VITALS — BP 135/75 | HR 73 | Resp 15 | Ht 61.5 in | Wt 160.4 lb

## 2021-02-14 DIAGNOSIS — Z905 Acquired absence of kidney: Secondary | ICD-10-CM | POA: Diagnosis not present

## 2021-02-14 DIAGNOSIS — Z8659 Personal history of other mental and behavioral disorders: Secondary | ICD-10-CM

## 2021-02-14 DIAGNOSIS — Z8679 Personal history of other diseases of the circulatory system: Secondary | ICD-10-CM

## 2021-02-14 DIAGNOSIS — M19042 Primary osteoarthritis, left hand: Secondary | ICD-10-CM

## 2021-02-14 DIAGNOSIS — M19041 Primary osteoarthritis, right hand: Secondary | ICD-10-CM

## 2021-02-14 DIAGNOSIS — U071 COVID-19: Secondary | ICD-10-CM

## 2021-02-14 DIAGNOSIS — D75A Glucose-6-phosphate dehydrogenase (G6PD) deficiency without anemia: Secondary | ICD-10-CM

## 2021-02-14 DIAGNOSIS — E785 Hyperlipidemia, unspecified: Secondary | ICD-10-CM | POA: Diagnosis not present

## 2021-02-14 DIAGNOSIS — M542 Cervicalgia: Secondary | ICD-10-CM

## 2021-02-14 DIAGNOSIS — Z96651 Presence of right artificial knee joint: Secondary | ICD-10-CM

## 2021-02-14 DIAGNOSIS — Z79899 Other long term (current) drug therapy: Secondary | ICD-10-CM

## 2021-02-14 DIAGNOSIS — Z96652 Presence of left artificial knee joint: Secondary | ICD-10-CM | POA: Diagnosis not present

## 2021-02-14 DIAGNOSIS — Z8781 Personal history of (healed) traumatic fracture: Secondary | ICD-10-CM | POA: Diagnosis not present

## 2021-02-14 DIAGNOSIS — Z87891 Personal history of nicotine dependence: Secondary | ICD-10-CM

## 2021-02-14 DIAGNOSIS — M545 Low back pain, unspecified: Secondary | ICD-10-CM | POA: Diagnosis not present

## 2021-02-14 DIAGNOSIS — M0579 Rheumatoid arthritis with rheumatoid factor of multiple sites without organ or systems involvement: Secondary | ICD-10-CM

## 2021-02-14 DIAGNOSIS — Z8669 Personal history of other diseases of the nervous system and sense organs: Secondary | ICD-10-CM

## 2021-02-14 DIAGNOSIS — G8929 Other chronic pain: Secondary | ICD-10-CM

## 2021-02-14 NOTE — Patient Instructions (Signed)
Standing Labs We placed an order today for your standing lab work.   Please have your standing labs drawn in November and every 3 months  If possible, please have your labs drawn 2 weeks prior to your appointment so that the provider can discuss your results at your appointment.  Please note that you may see your imaging and lab results in MyChart before we have reviewed them. We may be awaiting multiple results to interpret others before contacting you. Please allow our office up to 72 hours to thoroughly review all of the results before contacting the office for clarification of your results.  We have open lab daily: Monday through Thursday from 1:30-4:30 PM and Friday from 1:30-4:00 PM at the office of Dr. Pollyann Savoy, [facility].   Please be advised, all patients with office appointments requiring lab work will take precedent over walk-in lab work.  If possible, please come for your lab work on Monday and Friday afternoons, as you may experience shorter wait times. The office is located at [address], [address], [place], [place] [address] No appointment is necessary.   Labs are drawn by [facility]. Please bring your co-pay at the time of your lab draw.  You may receive a bill from [facility] for your lab work.  If you wish to have your labs drawn at another location, please call the office 24 hours in advance to send orders.  If you have any questions regarding directions or hours of operation,  please call [phone].   As a reminder, please drink plenty of water prior to coming for your lab work. Thanks!   If you test POSITIVE for COVID19 and have MILD to MODERATE symptoms: First, call your PCP if you would like to receive COVID19 treatment AND Hold your medications during the infection and for at least 1 week after your symptoms have resolved: Injectable medication (Benlysta, Cimzia, Cosentyx, Enbrel, Humira, Orencia, Remicade, Simponi, Stelara, Taltz,  Tremfya) Methotrexate Leflunomide (Arava) Azathioprine Mycophenolate (Cellcept) Osborne Oman, or Rinvoq Otezla If you take Actemra or Kevzara, you DO NOT need to hold these for COVID19 infection.  If you test POSITIVE for COVID19 and have NO symptoms: First, call your PCP if you would like to receive COVID19 treatment AND Hold your medications for at least 10 days after the day that you tested positive Injectable medication (Benlysta, Cimzia, Cosentyx, Enbrel, Humira, Orencia, Remicade, Simponi, Stelara, Taltz, Tremfya) Methotrexate Leflunomide (Arava) Azathioprine Mycophenolate (Cellcept) Osborne Oman, or Rinvoq Otezla If you take Actemra or Kevzara, you DO NOT need to hold these for COVID19 infection.  If you have signs or symptoms of an infection or start antibiotics: First, call your PCP for workup of your infection. Hold your medication through the infection, until you complete your antibiotics, and until symptoms resolve if you take the following: Injectable medication (Actemra, Benlysta, Cimzia, Cosentyx, Enbrel, Humira, Kevzara, Orencia, Remicade, Simponi, Stelara, Taltz, Tremfya) Methotrexate Leflunomide (Arava) Mycophenolate (Cellcept) Harriette Ohara, Olumiant, or Rinvoq  Vaccines You are taking a medication(s) that can suppress your immune system.  The following immunizations are recommended: Flu annually Covid-19  Td/Tdap (tetanus, diphtheria, pertussis) every 10 years Pneumonia (Prevnar 15 then Pneumovax 23 at least 1 year apart.  Alternatively, can take Prevnar 20 without needing additional dose) Shingrix (after age 26): 2 doses from 4 weeks to 6 months apart  Please check with your PCP to make sure you are up to date.  Heart Disease Prevention   Your inflammatory disease increases your risk of heart  disease which includes heart attack, stroke, atrial fibrillation (irregular heartbeats), high blood pressure, heart failure and atherosclerosis (plaque in the  arteries).  It is important to reduce your risk by:   Keep blood pressure, cholesterol, and blood sugar at healthy levels   Smoking Cessation   Maintain a healthy weight  BMI 20-25   Eat a healthy diet  Plenty of fresh fruit, vegetables, and whole grains  Limit saturated fats, foods high in sodium, and added sugars  DASH and Mediterranean diet   Increase physical activity  Recommend moderate physically activity for 150 minutes per week/ 30 minutes a day for five days a week These can be broken up into three separate ten-minute sessions during the day.   Reduce Stress  Meditation, slow breathing exercises, yoga, coloring books  Dental visits twice a year    Please get annual skin examination by dermatologist to screen for nonmelanoma skin cancer.

## 2021-02-15 NOTE — Progress Notes (Signed)
CBC and CMP are normal.  LDL is elevated.  Please forward results to her PCP.

## 2021-02-17 LAB — CBC WITH DIFFERENTIAL/PLATELET
Absolute Monocytes: 476 cells/uL (ref 200–950)
Basophils Absolute: 52 cells/uL (ref 0–200)
Basophils Relative: 0.9 %
Eosinophils Absolute: 696 cells/uL — ABNORMAL HIGH (ref 15–500)
Eosinophils Relative: 12 %
HCT: 41.6 % (ref 35.0–45.0)
Hemoglobin: 13.3 g/dL (ref 11.7–15.5)
Lymphs Abs: 1328 cells/uL (ref 850–3900)
MCH: 28.5 pg (ref 27.0–33.0)
MCHC: 32 g/dL (ref 32.0–36.0)
MCV: 89.3 fL (ref 80.0–100.0)
MPV: 10.1 fL (ref 7.5–12.5)
Monocytes Relative: 8.2 %
Neutro Abs: 3248 cells/uL (ref 1500–7800)
Neutrophils Relative %: 56 %
Platelets: 316 10*3/uL (ref 140–400)
RBC: 4.66 10*6/uL (ref 3.80–5.10)
RDW: 11.9 % (ref 11.0–15.0)
Total Lymphocyte: 22.9 %
WBC: 5.8 10*3/uL (ref 3.8–10.8)

## 2021-02-17 LAB — LIPID PANEL
Cholesterol: 233 mg/dL — ABNORMAL HIGH (ref <200)
HDL: 44 mg/dL — ABNORMAL LOW (ref >=50)
LDL Cholesterol (Calc): 170 mg/dL (calc) — ABNORMAL HIGH
Non-HDL Cholesterol (Calc): 189 mg/dL (calc) — ABNORMAL HIGH (ref <130)
Total CHOL/HDL Ratio: 5.3 (calc) — ABNORMAL HIGH (ref <5.0)
Triglycerides: 85 mg/dL (ref <150)

## 2021-02-17 LAB — QUANTIFERON-TB GOLD PLUS
Mitogen-NIL: 2.2 IU/mL
NIL: 0.02 IU/mL
QuantiFERON-TB Gold Plus: NEGATIVE
TB1-NIL: 0.01 IU/mL
TB2-NIL: 0 IU/mL

## 2021-02-17 LAB — COMPLETE METABOLIC PANEL WITH GFR
AG Ratio: 1.2 (calc) (ref 1.0–2.5)
ALT: 13 U/L (ref 6–29)
AST: 23 U/L (ref 10–35)
Albumin: 3.9 g/dL (ref 3.6–5.1)
Alkaline phosphatase (APISO): 96 U/L (ref 37–153)
BUN: 14 mg/dL (ref 7–25)
CO2: 31 mmol/L (ref 20–32)
Calcium: 9.4 mg/dL (ref 8.6–10.4)
Chloride: 103 mmol/L (ref 98–110)
Creat: 0.91 mg/dL (ref 0.60–1.00)
Globulin: 3.3 g/dL (calc) (ref 1.9–3.7)
Glucose, Bld: 81 mg/dL (ref 65–99)
Potassium: 3.8 mmol/L (ref 3.5–5.3)
Sodium: 140 mmol/L (ref 135–146)
Total Bilirubin: 0.6 mg/dL (ref 0.2–1.2)
Total Protein: 7.2 g/dL (ref 6.1–8.1)
eGFR: 67 mL/min/{1.73_m2} (ref >=60)

## 2021-02-18 NOTE — Progress Notes (Signed)
TB Gold is negative.

## 2021-02-27 ENCOUNTER — Other Ambulatory Visit: Payer: Self-pay | Admitting: *Deleted

## 2021-02-27 DIAGNOSIS — H43393 Other vitreous opacities, bilateral: Secondary | ICD-10-CM | POA: Diagnosis not present

## 2021-02-27 MED ORDER — HUMIRA (2 PEN) 40 MG/0.4ML ~~LOC~~ AJKT: 40.0000 mg | AUTO-INJECTOR | SUBCUTANEOUS | 0 refills | Status: DC

## 2021-02-27 NOTE — Telephone Encounter (Signed)
Refill request received via fax  Next Visit: 05/16/2021  Last Visit: 02/14/2021  Last Fill: 11/21/2020  [id number] arthritis with positive rheumatoid factor   Current Dose per office note 02/14/2021: Humira 40 mg sq injection every 14 days  Labs: 02/14/2021 CBC and CMP are normal  TB Gold: 02/14/2021 Neg    Okay to refill Humira?

## 2021-04-14 DIAGNOSIS — Z20822 Contact with and (suspected) exposure to covid-19: Secondary | ICD-10-CM | POA: Diagnosis not present

## 2021-05-02 NOTE — Progress Notes (Signed)
Office Visit Note  Patient: Diana [name]             Date of Birth: 02/23/1951           MRN: [id number]             PCP: Seward Carol, MD Referring: Seward Carol, MD Visit Date: 05/16/2021 Occupation: @GUAROCC @  Subjective:  Medication monitoring   History of Present Illness: Diana [name] is a 70 y.o. female with history of seropositive rheumatoid arthritis and osteoarthritis.  She is currently on humira 40 mg sq injections every 14 days.  Patient reports that after her last office visit she tried spacing Humira to every 21 days for about 2 doses but started to have increased pain and stiffness so she resumed injections every 2 weeks.  She denies any recent flares on the current treatment regimen.  She denies any joint swelling at this time.  She has not noticed any morning stiffness or nocturnal pain.  She has not had any difficulty with ADLs.  Her knee replacements have been doing well.  She has some difficulty going down steps but has noticed improvement when going up stairs. She denies any recent infections.  She was diagnosed with COVID-19 in June 2022 and was treated with Paxlovid at that time.  Patient reports that she had the annual influenza vaccination as well as the [place] booster on 04/01/2021.  She has also completed the Shingrix vaccine series.   Activities of Daily Living:  Patient reports morning stiffness for 0 minutes.   Patient Denies nocturnal pain.  Difficulty dressing/grooming: Denies Difficulty climbing stairs: Reports Difficulty getting out of chair: Denies Difficulty using hands for taps, buttons, cutlery, and/or writing: Denies  Review of Systems  Constitutional:  Negative for fatigue.  HENT:  Negative for mouth sores, mouth dryness and nose dryness.   Eyes:  Negative for pain, itching and dryness.  Respiratory:  Negative for shortness of breath and difficulty breathing.   Cardiovascular:  Negative for chest pain and palpitations.  Gastrointestinal:   Negative for blood in stool, constipation and diarrhea.  Endocrine: Negative for increased urination.  Genitourinary:  Negative for difficulty urinating.  Musculoskeletal:  Negative for joint pain, joint pain, joint swelling, myalgias, morning stiffness, muscle tenderness and myalgias.  Skin:  Negative for color change, rash and redness.  Allergic/Immunologic: Negative for susceptible to infections.  Neurological:  Negative for dizziness, numbness, headaches, memory loss and weakness.  Hematological:  Positive for bruising/bleeding tendency.  Psychiatric/Behavioral:  Negative for confusion.    PMFS History:  Patient Active Problem List   Diagnosis Date Noted   H/O total knee replacement, left 08/18/2019   Osteoarthritis of left knee 06/08/2019   Unilateral primary osteoarthritis, left knee 05/25/2019   History of total right knee replacement 02/02/2019   Rheumatoid arthritis with positive rheumatoid factor  05/14/2016   High risk medication use 05/14/2016   Primary osteoarthritis of both hands 05/14/2016   G6PD deficiency 05/14/2016   S/p nephrectomy 05/14/2016   Essential hypertension 05/14/2016   Depression 05/14/2016   Sleep apnea 05/14/2016   Former smoker 05/14/2016   History of ankle fracture 05/14/2016   Acute medial meniscus tear of right knee 10/08/2011    Class: Acute   Osteoarthritis of right knee 10/08/2011    Class: Chronic    Past Medical History:  Diagnosis Date   Depression    Hyperlipidemia    Hypertension    Kidney disease    "my one kidney doesnt  give me problems"    Positive PPD    works health dept   Rheumatoid arthritis(714.0)    Sleep apnea    uses cpap-mod-severe; i used to but then i got tired of it     History reviewed. No pertinent family history. Past Surgical History:  Procedure Laterality Date   ANKLE ARTHRODESIS  2010   left   BREAST LUMPECTOMY  1998   rt   BREAST SURGERY     rt breast bx   CERVICAL BIOPSY     KIDNEY SURGERY      KNEE ARTHROPLASTY     NEPHRECTOMY  2007   right   TOTAL KNEE ARTHROPLASTY Right 02/02/2019   Procedure: RIGHT TOTAL KNEE ARTHROPLASTY;  Surgeon: Valeria Batman, MD;  Location: [facility];  Service: Orthopedics;  Laterality: Right;   TOTAL KNEE ARTHROPLASTY Left 06/08/2019   Procedure: LEFT TOTAL KNEE ARTHROPLASTY;  Surgeon: Valeria Batman, MD;  Location: [facility];  Service: Orthopedics;  Laterality: Left;   TUBAL LIGATION     Social History   Social History Narrative   Not on file   Immunization History  Administered Date(s) Administered   Influenza, High Dose Seasonal PF 04/10/2018   PFIZER(Purple Top)SARS-COV-2 Vaccination 07/16/2019, 08/06/2019, 04/29/2020   Pneumococcal Polysaccharide-23 04/10/2018     Objective: Vital Signs: BP (!) 144/76 (BP Location: Left Arm, Patient Position: Sitting, Cuff Size: Normal)   Pulse 70   Ht 5\' 2"  (1.575 m)   Wt 161 lb (73 kg)   BMI 29.45 kg/m    Physical Exam Vitals and nursing note reviewed.  Constitutional:      Appearance: She is well-developed.  HENT:     Head: Normocephalic and atraumatic.  Eyes:     Conjunctiva/sclera: Conjunctivae normal.  Pulmonary:     Effort: Pulmonary effort is normal.  Abdominal:     Palpations: Abdomen is soft.  Musculoskeletal:     Cervical back: Normal range of motion.  Skin:    General: Skin is warm and dry.     Capillary Refill: Capillary refill takes less than 2 seconds.  Neurological:     Mental Status: She is alert and oriented to person, place, and time.  Psychiatric:        Behavior: Behavior normal.     Musculoskeletal Exam: C-spine, thoracic spine, lumbar spine have good range of motion with no discomfort.  Shoulder joints, elbow joints, wrist joints, MCPs, PIPs, DIPs have good range of motion with no synovitis.  Swan-neck deformity of right third digit noted.  Complete fist formation bilaterally.  PIP and DIP thickening consistent with osteoarthritis of both hands.  Some tenderness over  the right first MCP joint noted.  Hip joints have good range of motion with no discomfort.  Bilateral knee replacements have good range of motion with warmth but no effusion.  Ankle joints have good range of motion with no tenderness or joint swelling.  No tenderness over MTP joints.  CDAI Exam: CDAI Score: 0.6  Patient Global: 3 mm; Provider Global: 3 mm Swollen: 0 ; Tender: 0  Joint Exam 05/16/2021   No joint exam has been documented for this visit   There is currently no information documented on the homunculus. Go to the Rheumatology activity and complete the homunculus joint exam.  Investigation: No additional findings.  Imaging: No results found.  Recent Labs: Lab Results  Component Value Date   WBC 5.8 02/14/2021   HGB 13.3 02/14/2021   PLT 316 02/14/2021  NA 140 02/14/2021   K 3.8 02/14/2021   CL 103 02/14/2021   CO2 31 02/14/2021   GLUCOSE 81 02/14/2021   BUN 14 02/14/2021   CREATININE 0.91 02/14/2021   BILITOT 0.6 02/14/2021   ALKPHOS 99 06/07/2019   AST 23 02/14/2021   ALT 13 02/14/2021   PROT 7.2 02/14/2021   ALBUMIN 3.7 06/07/2019   CALCIUM 9.4 02/14/2021   GFRAA 79 09/11/2020   QFTBGOLD NEGATIVE 05/07/2017   QFTBGOLDPLUS NEGATIVE 02/14/2021    Speciality Comments: No specialty comments available.  Procedures:  No procedures performed Allergies: Other, Iodine, and Latex   Assessment / Plan:     Visit Diagnoses: Rheumatoid arthritis with positive rheumatoid factor: She has no synovitis on examination today.  She has not had any recent rheumatoid arthritis flares.  She is clinically doing well on Humira 40 mg subcutaneous injections every 14 days.  After her last office visit she tried spacing Humira to every 21 days for 2 doses but developed increased arthralgias and joint stiffness so she resumed on monthly injections.  Her symptoms have improved since resuming Humira every 14 days.  She has not been experiencing any morning stiffness, nocturnal pain,  or difficulty with ADLs.  Discussed the importance of regular exercise and joint protection.  She will remain on Humira as prescribed.  She was advised to notify us if she develops increased joint pain or joint swelling.  She will follow-up in the office in 4 to 5 months.  Association of heart disease with rheumatoid arthritis was discussed. Need to monitor blood pressure, cholesterol, and to exercise 30-60 minutes on daily basis was discussed.   High risk medication use - Humira 40 mg sq injection every 14 days.  CBC and CMP updated on 02/14/21.  She is due to update lab work today.  Orders released.  Next lab work will be due in November and every 3 months.  Standing orders for CBC and CMP are in place.   TB gold negative on 02/14/21.  - Plan: CBC with Differential/Platelet, COMPLETE METABOLIC PANEL WITH GFR She has not had any recent infections.  Discussed the importance of holding Humira if she develops signs or symptoms of an infection and to resume once the infection is completely cleared.  She received the annual influenza vaccination as well as the COVID-19 booster on 04/01/2021.  She was previously diagnosed with COVID-19 in June 2022 at which time she was treated with paxlovid.  She has completed the shingrix vaccine series.   Primary osteoarthritis of both hands: She has PIP and DIP thickening consistent with osteoarthritis of both hands.  No tenderness or inflammation was noted on examination today.  Discussed the importance of joint protection and muscle strengthening.  S/P total knee arthroplasty, left - Performed by Dr. Cleophas Dunker on 06/08/2019.  Doing well.  She has good range of motion with no discomfort at this time.  Discussed the importance of lower extremity muscle strengthening and regular exercise.  S/P total knee replacement, right - Performed by Dr. Cleophas Dunker on 02/02/2019. Doing well.  She has good ROM with no warmth or effusion.  She experiences some difficulty going down steps but  has had less difficulty ascending steps.  Discussed the importance of regular exercise.  She has a [employer] membership which she has been utilizing.  History of ankle fracture: Doing well.  She has good range of motion with no discomfort at this time.   Neck pain: She has good range of motion  of the C-spine with no discomfort.  No symptoms of radiculopathy at this time.  Other medical conditions are listed as follows:  G6PD deficiency  History of hypertension  Dyslipidemia  History of nephrectomy  History of sleep apnea  History of depression  Ex-smoker  COVID-19 virus infection: Diagnosed in June 2022.  She was treated with Paxlovid  She received a COVID booster on 04/01/2021.  Orders: Orders Placed This Encounter  Procedures   CBC with Differential/Platelet   COMPLETE METABOLIC PANEL WITH GFR   No orders of the defined types were placed in this encounter.   Follow-Up Instructions: Return in about 4 months (around 09/13/2021) for Rheumatoid arthritis, Osteoarthritis.   Ofilia Neas, PA-C  Note - This record has been created using Dragon software.  Chart creation errors have been sought, but may not always  have been located. Such creation errors do not reflect on  the standard of medical care.

## 2021-05-16 ENCOUNTER — Encounter: Payer: Self-pay | Admitting: Physician Assistant

## 2021-05-16 ENCOUNTER — Telehealth: Payer: Self-pay | Admitting: Pharmacist

## 2021-05-16 ENCOUNTER — Other Ambulatory Visit: Payer: Self-pay

## 2021-05-16 ENCOUNTER — Ambulatory Visit: Payer: Medicare Other | Admitting: Physician Assistant

## 2021-05-16 VITALS — BP 144/76 | HR 70 | Ht 62.0 in | Wt 161.0 lb

## 2021-05-16 DIAGNOSIS — E785 Hyperlipidemia, unspecified: Secondary | ICD-10-CM

## 2021-05-16 DIAGNOSIS — Z96651 Presence of right artificial knee joint: Secondary | ICD-10-CM | POA: Diagnosis not present

## 2021-05-16 DIAGNOSIS — Z8679 Personal history of other diseases of the circulatory system: Secondary | ICD-10-CM

## 2021-05-16 DIAGNOSIS — M0579 Rheumatoid arthritis with rheumatoid factor of multiple sites without organ or systems involvement: Secondary | ICD-10-CM | POA: Diagnosis not present

## 2021-05-16 DIAGNOSIS — D75A Glucose-6-phosphate dehydrogenase (G6PD) deficiency without anemia: Secondary | ICD-10-CM

## 2021-05-16 DIAGNOSIS — Z79899 Other long term (current) drug therapy: Secondary | ICD-10-CM

## 2021-05-16 DIAGNOSIS — Z8669 Personal history of other diseases of the nervous system and sense organs: Secondary | ICD-10-CM

## 2021-05-16 DIAGNOSIS — M542 Cervicalgia: Secondary | ICD-10-CM | POA: Diagnosis not present

## 2021-05-16 DIAGNOSIS — Z8659 Personal history of other mental and behavioral disorders: Secondary | ICD-10-CM

## 2021-05-16 DIAGNOSIS — M19041 Primary osteoarthritis, right hand: Secondary | ICD-10-CM | POA: Diagnosis not present

## 2021-05-16 DIAGNOSIS — Z8781 Personal history of (healed) traumatic fracture: Secondary | ICD-10-CM

## 2021-05-16 DIAGNOSIS — Z905 Acquired absence of kidney: Secondary | ICD-10-CM | POA: Diagnosis not present

## 2021-05-16 DIAGNOSIS — Z96652 Presence of left artificial knee joint: Secondary | ICD-10-CM

## 2021-05-16 DIAGNOSIS — Z87891 Personal history of nicotine dependence: Secondary | ICD-10-CM

## 2021-05-16 DIAGNOSIS — M19042 Primary osteoarthritis, left hand: Secondary | ICD-10-CM

## 2021-05-16 DIAGNOSIS — U071 COVID-19: Secondary | ICD-10-CM

## 2021-05-16 NOTE — Telephone Encounter (Signed)
Patient had OV today and requested renewal application for Humira through [employer] PAP. Patient sent home with patient portion and advised tot return to clinic with income documents   Will place provider portion in Sherron Ales, PA-C's, folder to be signed (along with med list, insurance card copy).      Chesley Mires, PharmD, MPH, BCPS Clinical Pharmacist (Rheumatology and Pulmonology)

## 2021-05-16 NOTE — Patient Instructions (Signed)
Standing Labs We placed an order today for your standing lab work.   Please have your standing labs drawn in February and every 3 months   If possible, please have your labs drawn 2 weeks prior to your appointment so that the provider can discuss your results at your appointment.  Please note that you may see your imaging and lab results in MyChart before we have reviewed them. We may be awaiting multiple results to interpret others before contacting you. Please allow our office up to 72 hours to thoroughly review all of the results before contacting the office for clarification of your results.  We have open lab daily: Monday through Thursday from 1:30-4:30 PM and Friday from 1:30-4:00 PM at the office of Dr. Shaili Deveshwar, [facility].   Please be advised, all patients with office appointments requiring lab work will take precedent over walk-in lab work.  If possible, please come for your lab work on Monday and Friday afternoons, as you may experience shorter wait times. The office is located at [address], [address], [place], [place] [address] No appointment is necessary.   Labs are drawn by [facility]. Please bring your co-pay at the time of your lab draw.  You may receive a bill from [facility] for your lab work.  If you wish to have your labs drawn at another location, please call the office 24 hours in advance to send orders.  If you have any questions regarding directions or hours of operation,  please call [phone].   As a reminder, please drink plenty of water prior to coming for your lab work. Thanks!  

## 2021-05-17 LAB — COMPLETE METABOLIC PANEL WITH GFR
AG Ratio: 1.2 (calc) (ref 1.0–2.5)
ALT: 14 U/L (ref 6–29)
AST: 20 U/L (ref 10–35)
Albumin: 3.9 g/dL (ref 3.6–5.1)
Alkaline phosphatase (APISO): 98 U/L (ref 37–153)
BUN: 20 mg/dL (ref 7–25)
CO2: 30 mmol/L (ref 20–32)
Calcium: 9.6 mg/dL (ref 8.6–10.4)
Chloride: 102 mmol/L (ref 98–110)
Creat: 0.94 mg/dL (ref 0.60–1.00)
Globulin: 3.3 g/dL (calc) (ref 1.9–3.7)
Glucose, Bld: 80 mg/dL (ref 65–99)
Potassium: 4.1 mmol/L (ref 3.5–5.3)
Sodium: 140 mmol/L (ref 135–146)
Total Bilirubin: 0.5 mg/dL (ref 0.2–1.2)
Total Protein: 7.2 g/dL (ref 6.1–8.1)
eGFR: 65 mL/min/{1.73_m2} (ref >=60)

## 2021-05-17 LAB — CBC WITH DIFFERENTIAL/PLATELET
Absolute Monocytes: 653 cells/uL (ref 200–950)
Basophils Absolute: 50 cells/uL (ref 0–200)
Basophils Relative: 0.7 %
Eosinophils Absolute: 788 cells/uL — ABNORMAL HIGH (ref 15–500)
Eosinophils Relative: 11.1 %
HCT: 40.7 % (ref 35.0–45.0)
Hemoglobin: 12.9 g/dL (ref 11.7–15.5)
Lymphs Abs: 1867 cells/uL (ref 850–3900)
MCH: 28.7 pg (ref 27.0–33.0)
MCHC: 31.7 g/dL — ABNORMAL LOW (ref 32.0–36.0)
MCV: 90.4 fL (ref 80.0–100.0)
MPV: 10.5 fL (ref 7.5–12.5)
Monocytes Relative: 9.2 %
Neutro Abs: 3742 cells/uL (ref 1500–7800)
Neutrophils Relative %: 52.7 %
Platelets: 312 10*3/uL (ref 140–400)
RBC: 4.5 10*6/uL (ref 3.80–5.10)
RDW: 11.9 % (ref 11.0–15.0)
Total Lymphocyte: 26.3 %
WBC: 7.1 10*3/uL (ref 3.8–10.8)

## 2021-05-17 NOTE — Progress Notes (Signed)
CMP WNL.  Absolute eosinophils remain elevated.  Rest of CBC WNL.

## 2021-05-17 NOTE — Telephone Encounter (Signed)
Received signed provider portion of application. Will place w med list, insurance card copy in "PAP pending info" folder in pharmacy office  Patient took home her portion of application at OV yesterday, 05/16/21  Chesley Mires, PharmD, MPH, BCPS Clinical Pharmacist (Rheumatology and Pulmonology)

## 2021-05-22 ENCOUNTER — Other Ambulatory Visit: Payer: Self-pay | Admitting: Rheumatology

## 2021-05-22 MED ORDER — HUMIRA (2 PEN) 40 MG/0.4ML ~~LOC~~ AJKT: 40.0000 mg | AUTO-INJECTOR | SUBCUTANEOUS | 0 refills | Status: DC

## 2021-05-22 NOTE — Telephone Encounter (Signed)
Requesting refill on Humira. Fax# [phone]

## 2021-05-22 NOTE — Telephone Encounter (Signed)
Next Visit: 09/12/2021  Last Visit: 05/16/2021  Last Fill: 02/27/2021  [id number] arthritis with positive rheumatoid factor  Current Dose per office note 05/16/2021: Humira 40 mg sq injection every 14 days  Labs: 05/16/2021 CMP WNL.  Absolute eosinophils remain elevated.  Rest of CBC WNL.    TB Gold: 02/14/2021 Neg    Okay to refill Humira?

## 2021-05-23 NOTE — Telephone Encounter (Signed)
ATC patient to review Humira renewal application. Phone rang and unable to leave VM. Will try again next week  Chesley Mires, PharmD, MPH, BCPS Clinical Pharmacist (Rheumatology and Pulmonology)

## 2021-05-30 NOTE — Telephone Encounter (Signed)
Spoke with patient regarding Humira renewal application. She states that she spoke with rep and was told that she was approved to receive Humira from [employer] through 06/30/22. Called [employer] and received verbal confirmation  regarding an approval for HUMIRA patient assistance through 06/30/2022.   Phone number: [phone]  Requested fax of approval letter be sent to clinic to file in patient's chart  Chesley Mires, PharmD, MPH, BCPS Clinical Pharmacist (Rheumatology and Pulmonology)

## 2021-06-27 ENCOUNTER — Telehealth: Payer: Self-pay

## 2021-06-27 ENCOUNTER — Telehealth: Payer: Self-pay | Admitting: *Deleted

## 2021-06-27 NOTE — Telephone Encounter (Signed)
Cliff from [employer] calling in regards to a prior authorization for Humira. They have a few clinical questions and need some clarification. Please call [phone].

## 2021-06-27 NOTE — Telephone Encounter (Signed)
Per PA denial letter, correct phone number was [phone]

## 2021-06-27 NOTE — Telephone Encounter (Signed)
Per [employer], current Prior Authorization is expiring.  Submitted a Prior Authorization request to [facility] for HUMIRA via CoverMyMeds. Will update once we receive a response.   Key: [id number]

## 2021-06-27 NOTE — Telephone Encounter (Signed)
Attempted to return call multiple times, consistently getting the "busy" tone.

## 2021-06-27 NOTE — Telephone Encounter (Signed)
Received a fax regarding Prior Authorization from [facility] for HUMIRA. Authorization has been DENIED because documentation was not provided stating that patient had tried and failed or has intolerance to methotrexate, sulfasalazine, or leflunomide.  Found chart note from 06/18/2016 which clearly states that pt has only one kidney and cannot take methotrexate. Appeal form filled out and submitted as expedited.  Phone# [phone] Fax# [phone]

## 2021-06-29 ENCOUNTER — Telehealth: Payer: Self-pay | Admitting: Rheumatology

## 2021-06-29 NOTE — Telephone Encounter (Signed)
Karesha from the [facility] appeals department left a voicemail regarding the patients PA renewal for Humira . Christle stated it has already been approved for Jan 2023 to Dec 2023. Phone #[phone]

## 2021-06-29 NOTE — Telephone Encounter (Signed)
Consolidating open encounters. Will need to f/u for clarification and/or verification.

## 2021-06-29 NOTE — Telephone Encounter (Signed)
Noted. Copying information into encounter already in progress.

## 2021-07-03 NOTE — Telephone Encounter (Signed)
Called [employer] for clarification on patient's Humira prior authorization status.  Per automated system, Josem Kaufmann is approved from 07/01/21 through 06/30/22.   Ref #[id number]  Knox Saliva, PharmD, MPH, BCPS Clinical Pharmacist (Rheumatology and Pulmonology)

## 2021-07-06 DIAGNOSIS — I739 Peripheral vascular disease, unspecified: Secondary | ICD-10-CM | POA: Diagnosis not present

## 2021-07-06 DIAGNOSIS — Z0000 Encounter for general adult medical examination without abnormal findings: Secondary | ICD-10-CM | POA: Diagnosis not present

## 2021-07-06 DIAGNOSIS — I10 Essential (primary) hypertension: Secondary | ICD-10-CM | POA: Diagnosis not present

## 2021-07-12 ENCOUNTER — Other Ambulatory Visit: Payer: Self-pay | Admitting: *Deleted

## 2021-07-12 MED ORDER — HUMIRA (2 PEN) 40 MG/0.4ML ~~LOC~~ AJKT: 40.0000 mg | AUTO-INJECTOR | SUBCUTANEOUS | 0 refills | Status: DC

## 2021-07-12 NOTE — Telephone Encounter (Signed)
Next Visit: 09/12/2021   Last Visit: 05/16/2021   Last Fill: 05/22/2021  [id number] arthritis with positive rheumatoid factor   Current Dose per office note 05/16/2021: Humira 40 mg sq injection every 14 days   Labs: 05/16/2021 CMP WNL.  Absolute eosinophils remain elevated.  Rest of CBC WNL.     TB Gold: 02/14/2021 Neg    Okay to refill Humira?

## 2021-07-20 DIAGNOSIS — I10 Essential (primary) hypertension: Secondary | ICD-10-CM | POA: Diagnosis not present

## 2021-07-20 DIAGNOSIS — E7800 Pure hypercholesterolemia, unspecified: Secondary | ICD-10-CM | POA: Diagnosis not present

## 2021-08-13 NOTE — Progress Notes (Signed)
Office Visit Note  Patient: Diana [name]             Date of Birth: 18-Feb-1951           MRN: [id number]             PCP: Seward Carol, MD Referring: Seward Carol, MD Visit Date: 08/23/2021 Occupation: @GUAROCC @  Subjective:  Medication management  History of Present Illness: Diana [name] is a 71 y.o. female with a history of seropositive rheumatoid arthritis and osteoarthritis.  Diana [name] states her bilateral total knee replacements are doing well.  Diana [name] has been tolerating Humira well without any side effects.  Diana [name] does not have much joint pain currently.  Her left ankle fracture has healed and does not cause much discomfort now.  Activities of Daily Living:  Patient reports morning stiffness for 0 minutes.   Patient Denies nocturnal pain.  Difficulty dressing/grooming: Denies Difficulty climbing stairs: Denies Difficulty getting out of chair: Denies Difficulty using hands for taps, buttons, cutlery, and/or writing: Denies  Review of Systems  Constitutional:  Negative for fatigue.  HENT:  Negative for mouth sores, mouth dryness and nose dryness.   Eyes:  Negative for pain, itching and dryness.  Respiratory:  Negative for shortness of breath and difficulty breathing.   Cardiovascular:  Negative for chest pain and palpitations.  Gastrointestinal:  Negative for blood in stool, constipation and diarrhea.  Endocrine: Negative for increased urination.  Genitourinary:  Negative for difficulty urinating.  Musculoskeletal:  Positive for joint pain and joint pain. Negative for joint swelling, myalgias, morning stiffness, muscle tenderness and myalgias.  Skin:  Negative for color change, rash and redness.  Allergic/Immunologic: Negative for susceptible to infections.  Neurological:  Negative for dizziness, numbness, headaches, memory loss and weakness.  Hematological:  Negative for bruising/bleeding tendency.  Psychiatric/Behavioral:  Negative for confusion.    PMFS History:  Patient  Active Problem List   Diagnosis Date Noted   H/O total knee replacement, left 08/18/2019   Osteoarthritis of left knee 06/08/2019   Unilateral primary osteoarthritis, left knee 05/25/2019   History of total right knee replacement 02/02/2019   Rheumatoid arthritis with positive rheumatoid factor  05/14/2016   High risk medication use 05/14/2016   Primary osteoarthritis of both hands 05/14/2016   G6PD deficiency 05/14/2016   S/p nephrectomy 05/14/2016   Essential hypertension 05/14/2016   Depression 05/14/2016   Sleep apnea 05/14/2016   Former smoker 05/14/2016   History of ankle fracture 05/14/2016   Acute medial meniscus tear of right knee 10/08/2011    Class: Acute   Osteoarthritis of right knee 10/08/2011    Class: Chronic    Past Medical History:  Diagnosis Date   Depression    Hyperlipidemia    Hypertension    Kidney disease    "my one kidney doesnt give me problems"    Positive PPD    works health dept   Rheumatoid arthritis(714.0)    Sleep apnea    uses cpap-mod-severe; i used to but then i got tired of it     History reviewed. No pertinent family history. Past Surgical History:  Procedure Laterality Date   ANKLE ARTHRODESIS  2010   left   BREAST LUMPECTOMY  1998   rt   BREAST SURGERY     rt breast bx   CERVICAL BIOPSY     KIDNEY SURGERY     KNEE ARTHROPLASTY     NEPHRECTOMY  2007   right   TOTAL KNEE ARTHROPLASTY  Right 02/02/2019   Procedure: RIGHT TOTAL KNEE ARTHROPLASTY;  Surgeon: Garald Balding, MD;  Location: [facility];  Service: Orthopedics;  Laterality: Right;   TOTAL KNEE ARTHROPLASTY Left 06/08/2019   Procedure: LEFT TOTAL KNEE ARTHROPLASTY;  Surgeon: Garald Balding, MD;  Location: [facility];  Service: Orthopedics;  Laterality: Left;   TUBAL LIGATION     Social History   Social History Narrative   Not on file   Immunization History  Administered Date(s) Administered   Influenza, High Dose Seasonal PF 04/10/2018   PFIZER(Purple Top)SARS-COV-2  Vaccination 07/16/2019, 08/06/2019, 04/29/2020   Pneumococcal Polysaccharide-23 04/10/2018     Objective: Vital Signs: BP (!) 144/71 (BP Location: Right Arm, Patient Position: Sitting, Cuff Size: Normal)    Pulse 67    Ht 5\' 1"  (1.549 m)    Wt 155 lb (70.3 kg)    BMI 29.29 kg/m    Physical Exam Vitals and nursing note reviewed.  Constitutional:      Appearance: Diana [name] is well-developed.  HENT:     Head: Normocephalic and atraumatic.  Eyes:     Conjunctiva/sclera: Conjunctivae normal.  Cardiovascular:     Rate and Rhythm: Normal rate and regular rhythm.     Heart sounds: Normal heart sounds.  Pulmonary:     Effort: Pulmonary effort is normal.     Breath sounds: Normal breath sounds.  Abdominal:     General: Bowel sounds are normal.     Palpations: Abdomen is soft.  Musculoskeletal:     Cervical back: Normal range of motion.  Lymphadenopathy:     Cervical: No cervical adenopathy.  Skin:    General: Skin is warm and dry.     Capillary Refill: Capillary refill takes less than 2 seconds.  Neurological:     Mental Status: Diana [name] is alert and oriented to person, place, and time.  Psychiatric:        Behavior: Behavior normal.     Musculoskeletal Exam: C-spine was in good range of motion.  Shoulder joints, elbow joints, wrist joints with good range of motion.  Diana [name] had bilateral CMC PIP and DIP thickening with no synovitis.  Hip joints and knee joints with good range of motion with no warmth swelling or effusion.  Bilateral knee joints were replaced.  Diana [name] had 2 left ankle joint surgery in the past.  Diana [name] had no tenderness on palpation.  There was no tenderness over MTPs.  CDAI Exam: CDAI Score: 0.5  Patient Global: 3 mm; Provider Global: 2 mm Swollen: 0 ; Tender: 0  Joint Exam 08/23/2021   No joint exam has been documented for this visit   There is currently no information documented on the homunculus. Go to the Rheumatology activity and complete the homunculus joint  exam.  Investigation: No additional findings.  Imaging: No results found.  Recent Labs: Lab Results  Component Value Date   WBC 7.1 05/16/2021   HGB 12.9 05/16/2021   PLT 312 05/16/2021   NA 140 05/16/2021   K 4.1 05/16/2021   CL 102 05/16/2021   CO2 30 05/16/2021   GLUCOSE 80 05/16/2021   BUN 20 05/16/2021   CREATININE 0.94 05/16/2021   BILITOT 0.5 05/16/2021   ALKPHOS 99 06/07/2019   AST 20 05/16/2021   ALT 14 05/16/2021   PROT 7.2 05/16/2021   ALBUMIN 3.7 06/07/2019   CALCIUM 9.6 05/16/2021   GFRAA 79 09/11/2020   QFTBGOLD NEGATIVE 05/07/2017   QFTBGOLDPLUS NEGATIVE 02/14/2021    Speciality Comments: No specialty comments  available.  Procedures:  No procedures performed Allergies: Other, Iodine, and Latex   Assessment / Plan:     Visit Diagnoses: Rheumatoid arthritis with positive rheumatoid factor -her rheumatoid arthritis is well controlled on Humira 40 mg subcu every 14 days.  Diana [name] had no synovitis on examination.  Diana [name] is trying to spacing Humira to every 3 weeks in the past and had increased joint pain and swelling.  Diana [name] will continue current dose of Humira for now.  High risk medication use - Humira 40 mg sq injection every 14 days.   - Plan: CBC with Differential/Platelet, COMPLETE METABOLIC PANEL WITH GFR today and then every 3 months.  Diana [name] has been advised to get updated immunization and the information was placed in the AVS.  Diana [name] was advised to hold Humira in case Diana [name] develops an infection and resume after the infection resolves.  Diana [name] was advised to get annual skin examination to screen for nonmelanoma skin cancer by dermatologist while Diana [name] is on Humira.  Primary osteoarthritis of both hands-Diana [name] had bilateral PIP and DIP thickening.  Diana [name] had bilateral CMC thickening.  Joint protection muscle strengthening was discussed.  S/P total knee arthroplasty, left - Performed by Dr. Durward Fortes on 06/08/2019.  Doing well.  S/P total knee replacement, right - Performed by  Dr. Durward Fortes on 02/02/2019.  Doing well with good range of motion.  History of ankle fracture - Left 2010.  Diana [name] denies any discomfort.  G6PD deficiency  History of hypertension-blood pressure was mildly elevated today.  Diana [name] was advised to monitor blood pressure closely.  History of nephrectomy  Dyslipidemia-increased risk of heart disease with rheumatoid arthritis was discussed.  Need for regular exercise and dietary modifications were discussed.  History of sleep apnea  History of depression  Ex-smoker  Orders: Orders Placed This Encounter  Procedures   CBC with Differential/Platelet   COMPLETE METABOLIC PANEL WITH GFR   No orders of the defined types were placed in this encounter.   Follow-Up Instructions: Return in about 5 months (around 01/20/2022) for Rheumatoid arthritis.   Bo Merino, MD  Note - This record has been created using Editor, commissioning.  Chart creation errors have been sought, but may not always  have been located. Such creation errors do not reflect on  the standard of medical care.

## 2021-08-14 DIAGNOSIS — Z1231 Encounter for screening mammogram for malignant neoplasm of breast: Secondary | ICD-10-CM | POA: Diagnosis not present

## 2021-08-23 ENCOUNTER — Ambulatory Visit: Payer: Medicare Other | Admitting: Rheumatology

## 2021-08-23 ENCOUNTER — Encounter: Payer: Self-pay | Admitting: Rheumatology

## 2021-08-23 ENCOUNTER — Other Ambulatory Visit: Payer: Self-pay

## 2021-08-23 VITALS — BP 144/71 | HR 67 | Ht 61.0 in | Wt 155.0 lb

## 2021-08-23 DIAGNOSIS — D75A Glucose-6-phosphate dehydrogenase (G6PD) deficiency without anemia: Secondary | ICD-10-CM

## 2021-08-23 DIAGNOSIS — M0579 Rheumatoid arthritis with rheumatoid factor of multiple sites without organ or systems involvement: Secondary | ICD-10-CM

## 2021-08-23 DIAGNOSIS — Z96652 Presence of left artificial knee joint: Secondary | ICD-10-CM | POA: Diagnosis not present

## 2021-08-23 DIAGNOSIS — Z905 Acquired absence of kidney: Secondary | ICD-10-CM

## 2021-08-23 DIAGNOSIS — Z8669 Personal history of other diseases of the nervous system and sense organs: Secondary | ICD-10-CM

## 2021-08-23 DIAGNOSIS — Z8781 Personal history of (healed) traumatic fracture: Secondary | ICD-10-CM | POA: Diagnosis not present

## 2021-08-23 DIAGNOSIS — Z8659 Personal history of other mental and behavioral disorders: Secondary | ICD-10-CM

## 2021-08-23 DIAGNOSIS — M19041 Primary osteoarthritis, right hand: Secondary | ICD-10-CM | POA: Diagnosis not present

## 2021-08-23 DIAGNOSIS — M19042 Primary osteoarthritis, left hand: Secondary | ICD-10-CM

## 2021-08-23 DIAGNOSIS — Z8679 Personal history of other diseases of the circulatory system: Secondary | ICD-10-CM

## 2021-08-23 DIAGNOSIS — U071 COVID-19: Secondary | ICD-10-CM

## 2021-08-23 DIAGNOSIS — E785 Hyperlipidemia, unspecified: Secondary | ICD-10-CM | POA: Diagnosis not present

## 2021-08-23 DIAGNOSIS — Z87891 Personal history of nicotine dependence: Secondary | ICD-10-CM

## 2021-08-23 DIAGNOSIS — Z79899 Other long term (current) drug therapy: Secondary | ICD-10-CM

## 2021-08-23 DIAGNOSIS — M542 Cervicalgia: Secondary | ICD-10-CM

## 2021-08-23 DIAGNOSIS — Z96651 Presence of right artificial knee joint: Secondary | ICD-10-CM

## 2021-08-23 LAB — COMPLETE METABOLIC PANEL WITH GFR
AG Ratio: 1.3 (calc) (ref 1.0–2.5)
ALT: 11 U/L (ref 6–29)
AST: 17 U/L (ref 10–35)
Albumin: 3.9 g/dL (ref 3.6–5.1)
Alkaline phosphatase (APISO): 100 U/L (ref 37–153)
BUN: 13 mg/dL (ref 7–25)
CO2: 33 mmol/L — ABNORMAL HIGH (ref 20–32)
Calcium: 9.9 mg/dL (ref 8.6–10.4)
Chloride: 102 mmol/L (ref 98–110)
Creat: 0.97 mg/dL (ref 0.60–1.00)
Globulin: 3.1 g/dL (calc) (ref 1.9–3.7)
Glucose, Bld: 84 mg/dL (ref 65–99)
Potassium: 4.1 mmol/L (ref 3.5–5.3)
Sodium: 141 mmol/L (ref 135–146)
Total Bilirubin: 0.6 mg/dL (ref 0.2–1.2)
Total Protein: 7 g/dL (ref 6.1–8.1)
eGFR: 62 mL/min/{1.73_m2} (ref >=60)

## 2021-08-23 LAB — CBC WITH DIFFERENTIAL/PLATELET
Absolute Monocytes: 561 cells/uL (ref 200–950)
Basophils Absolute: 38 cells/uL (ref 0–200)
Basophils Relative: 0.6 %
Eosinophils Absolute: 718 cells/uL — ABNORMAL HIGH (ref 15–500)
Eosinophils Relative: 11.4 %
HCT: 41.9 % (ref 35.0–45.0)
Hemoglobin: 13.5 g/dL (ref 11.7–15.5)
Lymphs Abs: 1487 cells/uL (ref 850–3900)
MCH: 29 pg (ref 27.0–33.0)
MCHC: 32.2 g/dL (ref 32.0–36.0)
MCV: 90.1 fL (ref 80.0–100.0)
MPV: 10 fL (ref 7.5–12.5)
Monocytes Relative: 8.9 %
Neutro Abs: 3497 cells/uL (ref 1500–7800)
Neutrophils Relative %: 55.5 %
Platelets: 324 10*3/uL (ref 140–400)
RBC: 4.65 10*6/uL (ref 3.80–5.10)
RDW: 12.1 % (ref 11.0–15.0)
Total Lymphocyte: 23.6 %
WBC: 6.3 10*3/uL (ref 3.8–10.8)

## 2021-08-23 NOTE — Patient Instructions (Signed)
Standing Labs We placed an order today for your standing lab work.   Please have your standing labs drawn in May and every 3 months  If possible, please have your labs drawn 2 weeks prior to your appointment so that the provider can discuss your results at your appointment.  Please note that you may see your imaging and lab results in MyChart before we have reviewed them. We may be awaiting multiple results to interpret others before contacting you. Please allow our office up to 72 hours to thoroughly review all of the results before contacting the office for clarification of your results.  We have open lab daily: Monday through Thursday from 1:30-4:30 PM and Friday from 1:30-4:00 PM at the office of Dr. Pollyann Savoy, [facility].   Please be advised, all patients with office appointments requiring lab work will take precedent over walk-in lab work.  If possible, please come for your lab work on Monday and Friday afternoons, as you may experience shorter wait times. The office is located at [address], [address], [place], [place] [address] No appointment is necessary.   Labs are drawn by [facility]. Please bring your co-pay at the time of your lab draw.  You may receive a bill from [facility] for your lab work.  Please note if you are on Hydroxychloroquine and and an order has been placed for a Hydroxychloroquine level, you will need to have it drawn 4 hours or more after your last dose.  If you wish to have your labs drawn at another location, please call the office 24 hours in advance to send orders.  If you have any questions regarding directions or hours of operation,  please call [phone].   As a reminder, please drink plenty of water prior to coming for your lab work. Thanks!   Vaccines You are taking a medication(s) that can suppress your immune system.  The following immunizations are recommended: Flu annually Covid-19  Td/Tdap (tetanus, diphtheria, pertussis)  every 10 years Pneumonia (Prevnar 15 then Pneumovax 23 at least 1 year apart.  Alternatively, can take Prevnar 20 without needing additional dose) Shingrix: 2 doses from 4 weeks to 6 months apart  Please check with your PCP to make sure you are up to date.   If you have signs or symptoms of an infection or start antibiotics: First, call your PCP for workup of your infection. Hold your medication through the infection, until you complete your antibiotics, and until symptoms resolve if you take the following: Injectable medication (Actemra, Benlysta, Cimzia, Cosentyx, Enbrel, Humira, Kevzara, Orencia, Remicade, Simponi, Stelara, Taltz, Tremfya) Methotrexate Leflunomide (Arava) Mycophenolate (Cellcept) Osborne [place], or Rinvoq   Please get yearly skin examination to screen for skin cancer by a dermatologist while you are on Humira

## 2021-08-24 NOTE — Progress Notes (Signed)
CBC and CMP are stable.

## 2021-09-12 ENCOUNTER — Ambulatory Visit: Payer: Medicare Other | Admitting: Rheumatology

## 2021-09-19 ENCOUNTER — Other Ambulatory Visit: Payer: Self-pay | Admitting: Rheumatology

## 2021-09-19 MED ORDER — HUMIRA (2 PEN) 40 MG/0.4ML ~~LOC~~ AJKT: 40.0000 mg | AUTO-INJECTOR | SUBCUTANEOUS | 0 refills | Status: DC

## 2021-09-19 NOTE — Telephone Encounter (Signed)
Diana [name] with [facility] left a voicemail requesting a refill of Humira 40mg /0.76ml for the patient.  ?Verbal [phone] Option 1 for Humira ?Fax [phone] ?

## 2021-09-19 NOTE — Telephone Encounter (Signed)
Next Visit: 01/23/2022 ? ?Last Visit: 08/23/2021 ? ?Last Fill: 07/12/2021 ? ?DX: Rheumatoid arthritis with positive rheumatoid factor  ? ?Current Dose per office note 08/23/2021: Humira 40 mg sq injection every 14 days.  ? ?Labs: 08/23/2021 CBC and CMP are stable. ? ?TB Gold: 02/14/2021 Neg   ? ?Okay to refill Humira?  ?

## 2021-12-28 ENCOUNTER — Other Ambulatory Visit: Payer: Self-pay | Admitting: *Deleted

## 2021-12-28 DIAGNOSIS — Z79899 Other long term (current) drug therapy: Secondary | ICD-10-CM | POA: Diagnosis not present

## 2021-12-28 DIAGNOSIS — E785 Hyperlipidemia, unspecified: Secondary | ICD-10-CM | POA: Diagnosis not present

## 2021-12-29 LAB — COMPLETE METABOLIC PANEL WITH GFR
AG Ratio: 1.1 (calc) (ref 1.0–2.5)
ALT: 9 U/L (ref 6–29)
AST: 18 U/L (ref 10–35)
Albumin: 3.5 g/dL — ABNORMAL LOW (ref 3.6–5.1)
Alkaline phosphatase (APISO): 100 U/L (ref 37–153)
BUN: 13 mg/dL (ref 7–25)
CO2: 31 mmol/L (ref 20–32)
Calcium: 9.6 mg/dL (ref 8.6–10.4)
Chloride: 104 mmol/L (ref 98–110)
Creat: 0.97 mg/dL (ref 0.60–1.00)
Globulin: 3.2 g/dL (calc) (ref 1.9–3.7)
Glucose, Bld: 83 mg/dL (ref 65–99)
Potassium: 3.9 mmol/L (ref 3.5–5.3)
Sodium: 143 mmol/L (ref 135–146)
Total Bilirubin: 0.5 mg/dL (ref 0.2–1.2)
Total Protein: 6.7 g/dL (ref 6.1–8.1)
eGFR: 62 mL/min/{1.73_m2} (ref >=60)

## 2021-12-29 LAB — LIPID PANEL
Cholesterol: 201 mg/dL — ABNORMAL HIGH (ref <200)
HDL: 38 mg/dL — ABNORMAL LOW (ref >=50)
LDL Cholesterol (Calc): 140 mg/dL (calc) — ABNORMAL HIGH
Non-HDL Cholesterol (Calc): 163 mg/dL (calc) — ABNORMAL HIGH (ref <130)
Total CHOL/HDL Ratio: 5.3 (calc) — ABNORMAL HIGH (ref <5.0)
Triglycerides: 109 mg/dL (ref <150)

## 2021-12-29 LAB — CBC WITH DIFFERENTIAL/PLATELET
Absolute Monocytes: 602 cells/uL (ref 200–950)
Basophils Absolute: 41 cells/uL (ref 0–200)
Basophils Relative: 0.7 %
Eosinophils Absolute: 620 cells/uL — ABNORMAL HIGH (ref 15–500)
Eosinophils Relative: 10.5 %
HCT: 38.1 % (ref 35.0–45.0)
Hemoglobin: 12.3 g/dL (ref 11.7–15.5)
Lymphs Abs: 1752 cells/uL (ref 850–3900)
MCH: 29 pg (ref 27.0–33.0)
MCHC: 32.3 g/dL (ref 32.0–36.0)
MCV: 89.9 fL (ref 80.0–100.0)
MPV: 10.2 fL (ref 7.5–12.5)
Monocytes Relative: 10.2 %
Neutro Abs: 2885 cells/uL (ref 1500–7800)
Neutrophils Relative %: 48.9 %
Platelets: 310 10*3/uL (ref 140–400)
RBC: 4.24 10*6/uL (ref 3.80–5.10)
RDW: 12.1 % (ref 11.0–15.0)
Total Lymphocyte: 29.7 %
WBC: 5.9 10*3/uL (ref 3.8–10.8)

## 2022-01-03 NOTE — Progress Notes (Signed)
LDL is better.  HDL is still low.  CMP and CBC are normal.  Please forward results to her PCP.

## 2022-01-04 ENCOUNTER — Other Ambulatory Visit: Payer: Self-pay | Admitting: *Deleted

## 2022-01-04 MED ORDER — HUMIRA (2 PEN) 40 MG/0.4ML ~~LOC~~ AJKT: 40.0000 mg | AUTO-INJECTOR | SUBCUTANEOUS | 0 refills | Status: DC

## 2022-01-04 NOTE — Telephone Encounter (Signed)
Refill request received via fax  Next Visit: 01/23/2022   Last Visit: 08/23/2021   Last Fill: 09/19/2021   DX: Rheumatoid arthritis with positive rheumatoid factor    Current Dose per office note 08/23/2021: Humira 40 mg sq injection every 14 days.    Labs: 12/28/2021 CMP and CBC are normal.    TB Gold: 02/14/2021 Neg     Okay to refill Humira?

## 2022-01-09 NOTE — Progress Notes (Signed)
Office Visit Note  Patient: Diana [name]             Date of Birth: Jul 04, 1950           MRN: [id number]             PCP: Renford Dills, MD Referring: Renford Dills, MD Visit Date: 01/23/2022 Occupation: @GUAROCC @  Subjective:  Medication monitoring   History of Present Illness: Diana [name] is a 71 y.o. female with history of seropositive rheumatoid arthritis and osteoarthritis.  She remains on humira 40 mg sq injections once every 14 days.  She continues to tolerate Humira without any side effects or injection site reactions.  She denies any signs or symptoms of a rheumatoid arthritis flare.  She states that she was in [place] from March to June and did not experience any flares despite being more active.  She denies any joint swelling at this time.  She has not had any morning stiffness or nocturnal pain.  She states that her right knee replacement is doing well but she continues to have some stiffness in the left knee replacement.  She has been using a natural topical agent that she got in [place] as needed for symptomatic relief when needed. She denies any new medical conditions.  She has not had any recent or recurrent infections.    Activities of Daily Living:  Patient reports morning stiffness for 0 minutes  Patient Denies nocturnal pain.  Difficulty dressing/grooming: Denies Difficulty climbing stairs: Denies Difficulty getting out of chair: Denies Difficulty using hands for taps, buttons, cutlery, and/or writing: Denies  Review of Systems  Constitutional:  Negative for fatigue.  HENT:  Negative for mouth sores, mouth dryness and nose dryness.   Eyes:  Negative for pain, visual disturbance and dryness.  Respiratory:  Negative for cough, hemoptysis, shortness of breath and difficulty breathing.   Cardiovascular:  Negative for chest pain, palpitations, hypertension and swelling in legs/feet.  Gastrointestinal:  Negative for blood in stool, constipation and diarrhea.   Endocrine: Negative for increased urination.  Genitourinary:  Negative for painful urination.  Musculoskeletal:  Negative for joint pain, joint pain, joint swelling, myalgias, muscle weakness, morning stiffness, muscle tenderness and myalgias.  Skin:  Negative for color change, pallor, rash, hair loss, nodules/bumps, skin tightness, ulcers and sensitivity to sunlight.  Allergic/Immunologic: Negative for susceptible to infections.  Neurological:  Negative for dizziness, numbness, headaches and weakness.  Hematological:  Negative for swollen glands.  Psychiatric/Behavioral:  Negative for depressed mood and sleep disturbance. The patient is not nervous/anxious.     PMFS History:  Patient Active Problem List   Diagnosis Date Noted   H/O total knee replacement, left 08/18/2019   Osteoarthritis of left knee 06/08/2019   Unilateral primary osteoarthritis, left knee 05/25/2019   History of total right knee replacement 02/02/2019   Rheumatoid arthritis with positive rheumatoid factor  05/14/2016   High risk medication use 05/14/2016   Primary osteoarthritis of both hands 05/14/2016   G6PD deficiency 05/14/2016   S/p nephrectomy 05/14/2016   Essential hypertension 05/14/2016   Depression 05/14/2016   Sleep apnea 05/14/2016   Former smoker 05/14/2016   History of ankle fracture 05/14/2016   Acute medial meniscus tear of right knee 10/08/2011    Class: Acute   Osteoarthritis of right knee 10/08/2011    Class: Chronic    Past Medical History:  Diagnosis Date   Depression    Hyperlipidemia    Hypertension    Kidney disease    "  my one kidney doesnt give me problems"    Positive PPD    works health dept   Rheumatoid arthritis(714.0)    Sleep apnea    uses cpap-mod-severe; i used to but then i got tired of it     History reviewed. No pertinent family history. Past Surgical History:  Procedure Laterality Date   ANKLE ARTHRODESIS  2010   left   BREAST LUMPECTOMY  1998   rt   BREAST  SURGERY     rt breast bx   CERVICAL BIOPSY     KIDNEY SURGERY     KNEE ARTHROPLASTY     NEPHRECTOMY  2007   right   TOTAL KNEE ARTHROPLASTY Right 02/02/2019   Procedure: RIGHT TOTAL KNEE ARTHROPLASTY;  Surgeon: Valeria Batman, MD;  Location: [facility];  Service: Orthopedics;  Laterality: Right;   TOTAL KNEE ARTHROPLASTY Left 06/08/2019   Procedure: LEFT TOTAL KNEE ARTHROPLASTY;  Surgeon: Valeria Batman, MD;  Location: [facility];  Service: Orthopedics;  Laterality: Left;   TUBAL LIGATION     Social History   Social History Narrative   Not on file   Immunization History  Administered Date(s) Administered   Influenza, High Dose Seasonal PF 04/10/2018   PFIZER(Purple Top)SARS-COV-2 Vaccination 07/16/2019, 08/06/2019, 04/29/2020   Pneumococcal Polysaccharide-23 04/10/2018     Objective: Vital Signs: BP 133/73 (BP Location: Left Arm, Patient Position: Sitting, Cuff Size: Normal)   Pulse 66   Resp 16   Ht 5\' 2"  (1.575 m)   Wt 147 lb (66.7 kg)   BMI 26.89 kg/m    Physical Exam Vitals and nursing note reviewed.  Constitutional:      Appearance: She is well-developed.  HENT:     Head: Normocephalic and atraumatic.  Eyes:     Conjunctiva/sclera: Conjunctivae normal.  Cardiovascular:     Rate and Rhythm: Normal rate and regular rhythm.     Heart sounds: Normal heart sounds.  Pulmonary:     Effort: Pulmonary effort is normal.     Breath sounds: Normal breath sounds.  Abdominal:     General: Bowel sounds are normal.     Palpations: Abdomen is soft.  Musculoskeletal:     Cervical back: Normal range of motion.  Skin:    General: Skin is warm and dry.     Capillary Refill: Capillary refill takes less than 2 seconds.  Neurological:     Mental Status: She is alert and oriented to person, place, and time.  Psychiatric:        Behavior: Behavior normal.      Musculoskeletal Exam: C-spine, thoracic spine, and lumbar spine good ORM.  Shoulders, elbow joints, wrist joints, Mcps,  PIPs, and DIPs good ROM with no synovitis. Complete fist formation bilaterally.  PIP and DIP thickening consistent with osteoarthritis of both hands.  Hip joints good ROM with no groin pain.  Right knee replacement has good range of motion with no warmth or effusion.  Left knee joint has slightly limited flexion and extension.  Ankle joints have good range of motion with no joint tenderness.  No tenderness over MTP joints.  CDAI Exam: CDAI Score: 0.6  Patient Global: 3 mm; Provider Global: 3 mm Swollen: 0 ; Tender: 0  Joint Exam 01/23/2022   No joint exam has been documented for this visit   There is currently no information documented on the homunculus. Go to the Rheumatology activity and complete the homunculus joint exam.  Investigation: No additional findings.  Imaging: No  results found.  Recent Labs: Lab Results  Component Value Date   WBC 5.9 12/28/2021   HGB 12.3 12/28/2021   PLT 310 12/28/2021   NA 143 12/28/2021   K 3.9 12/28/2021   CL 104 12/28/2021   CO2 31 12/28/2021   GLUCOSE 83 12/28/2021   BUN 13 12/28/2021   CREATININE 0.97 12/28/2021   BILITOT 0.5 12/28/2021   ALKPHOS 99 06/07/2019   AST 18 12/28/2021   ALT 9 12/28/2021   PROT 6.7 12/28/2021   ALBUMIN 3.7 06/07/2019   CALCIUM 9.6 12/28/2021   GFRAA 79 09/11/2020   QFTBGOLD NEGATIVE 05/07/2017   QFTBGOLDPLUS NEGATIVE 02/14/2021    Speciality Comments: No specialty comments available.  Procedures:  No procedures performed Allergies: Other, Iodine, and Latex   Assessment / Plan:     Visit Diagnoses: Rheumatoid arthritis with positive rheumatoid factor: She has no joint tenderness or synovitis on examination today.  She has not had any signs or symptoms of a rheumatoid arthritis flare.  She has clinically been doing well on Humira 40 mg sq injections every 14 days.  She continues to tolerate Humira without any side effects or injection site reactions.  She recently traveled to [place] for 4 months and did  not have any flares despite increasing her level of activity.  She will remain on Humira as prescribed.  She denies to notify us if she develops signs or symptoms of a flare.  She will follow-up in the office in 5 months or sooner if needed.  Association of heart disease with rheumatoid arthritis was discussed. Need to monitor blood pressure, cholesterol, and to exercise 30-60 minutes on daily basis was discussed.   High risk medication use - Humira 40 mg sq injection every 14 days. - Plan: QuantiFERON-TB Gold Plus CBC and CMP updated on 12/28/21.  Her next lab work will be due in September and every 3 months to monitor for drug toxicity.  Standing orders for CBC and CMP remain in place. TB gold negative on 02/14/21. Future order for TB gold placed today.  She has not had any recent or recurrent infections.  Discussed the importance of holding humira if she develops signs or symptoms of an infection and to resume once the infection has completely cleared.   Discussed the importance of yearly skin examinations while on Humira.  Screening for tuberculosis -Future order for TB gold placed today.  Plan: QuantiFERON-TB Gold Plus  Primary osteoarthritis of both hands: She has PIP and DIP thickening consistent with osteoarthritis of both hands.  No tenderness or inflammation noted.  She was able to make a complete fist bilaterally.  S/P total knee arthroplasty, left: She experiences intermittent stiffness and discomfort but continues to perform stretching and strengthening exercises at home.  S/P total knee replacement, right: Doing well.  Under percent improvement in her pain and stiffness since having her right knee replaced.  History of ankle fracture: Doing well.    Other medical conditions are listed as follows:   G6PD deficiency  History of hypertension: BP was 133/73 today in the office.   History of nephrectomy  History of sleep apnea  History of  depression  Ex-smoker  Dyslipidemia    Orders: Orders Placed This Encounter  Procedures   QuantiFERON-TB Gold Plus   No orders of the defined types were placed in this encounter.    Follow-Up Instructions: Return in about 5 months (around 06/25/2022) for Rheumatoid arthritis, Osteoarthritis.   Gearldine Bienenstock, PA-C  Note -  This record has been created using [employer].  Chart creation errors have been sought, but may not always  have been located. Such creation errors do not reflect on  the standard of medical care.

## 2022-01-23 ENCOUNTER — Encounter: Payer: Self-pay | Admitting: Physician Assistant

## 2022-01-23 ENCOUNTER — Ambulatory Visit: Payer: Medicare Other | Admitting: Physician Assistant

## 2022-01-23 VITALS — BP 133/73 | HR 66 | Resp 16 | Ht 62.0 in | Wt 147.0 lb

## 2022-01-23 DIAGNOSIS — Z8679 Personal history of other diseases of the circulatory system: Secondary | ICD-10-CM | POA: Diagnosis not present

## 2022-01-23 DIAGNOSIS — M0579 Rheumatoid arthritis with rheumatoid factor of multiple sites without organ or systems involvement: Secondary | ICD-10-CM | POA: Diagnosis not present

## 2022-01-23 DIAGNOSIS — Z905 Acquired absence of kidney: Secondary | ICD-10-CM

## 2022-01-23 DIAGNOSIS — Z8659 Personal history of other mental and behavioral disorders: Secondary | ICD-10-CM

## 2022-01-23 DIAGNOSIS — Z79899 Other long term (current) drug therapy: Secondary | ICD-10-CM

## 2022-01-23 DIAGNOSIS — Z8781 Personal history of (healed) traumatic fracture: Secondary | ICD-10-CM | POA: Diagnosis not present

## 2022-01-23 DIAGNOSIS — Z96651 Presence of right artificial knee joint: Secondary | ICD-10-CM

## 2022-01-23 DIAGNOSIS — D75A Glucose-6-phosphate dehydrogenase (G6PD) deficiency without anemia: Secondary | ICD-10-CM

## 2022-01-23 DIAGNOSIS — Z8669 Personal history of other diseases of the nervous system and sense organs: Secondary | ICD-10-CM

## 2022-01-23 DIAGNOSIS — M19041 Primary osteoarthritis, right hand: Secondary | ICD-10-CM

## 2022-01-23 DIAGNOSIS — Z96652 Presence of left artificial knee joint: Secondary | ICD-10-CM

## 2022-01-23 DIAGNOSIS — E785 Hyperlipidemia, unspecified: Secondary | ICD-10-CM

## 2022-01-23 DIAGNOSIS — Z87891 Personal history of nicotine dependence: Secondary | ICD-10-CM | POA: Diagnosis not present

## 2022-01-23 DIAGNOSIS — Z111 Encounter for screening for respiratory tuberculosis: Secondary | ICD-10-CM

## 2022-01-23 DIAGNOSIS — M19042 Primary osteoarthritis, left hand: Secondary | ICD-10-CM

## 2022-01-23 NOTE — Patient Instructions (Signed)

## 2022-02-19 DIAGNOSIS — G4733 Obstructive sleep apnea (adult) (pediatric): Secondary | ICD-10-CM | POA: Diagnosis not present

## 2022-02-19 DIAGNOSIS — E7800 Pure hypercholesterolemia, unspecified: Secondary | ICD-10-CM | POA: Diagnosis not present

## 2022-02-19 DIAGNOSIS — Z905 Acquired absence of kidney: Secondary | ICD-10-CM | POA: Diagnosis not present

## 2022-02-19 DIAGNOSIS — I10 Essential (primary) hypertension: Secondary | ICD-10-CM | POA: Diagnosis not present

## 2022-02-19 DIAGNOSIS — M0579 Rheumatoid arthritis with rheumatoid factor of multiple sites without organ or systems involvement: Secondary | ICD-10-CM | POA: Diagnosis not present

## 2022-02-21 ENCOUNTER — Ambulatory Visit: Payer: Self-pay

## 2022-02-21 NOTE — Patient Outreach (Signed)
  Care Coordination   02/21/2022 Name: Diana [name] MRN: [id number] DOB: 1951-06-17   Care Coordination Outreach Attempts:  An unsuccessful telephone outreach was attempted today to offer the patient information about available care coordination services as a benefit of their health plan.   Follow Up Plan:  Additional outreach attempts will be made to offer the patient care coordination information and services.   Encounter Outcome:  No Answer  Care Coordination Interventions Activated:  No   Care Coordination Interventions:  No, not indicated    [facility] Management [phone]

## 2022-03-11 ENCOUNTER — Other Ambulatory Visit: Payer: Self-pay | Admitting: *Deleted

## 2022-03-11 DIAGNOSIS — Z79899 Other long term (current) drug therapy: Secondary | ICD-10-CM

## 2022-03-11 DIAGNOSIS — Z111 Encounter for screening for respiratory tuberculosis: Secondary | ICD-10-CM

## 2022-03-13 LAB — QUANTIFERON-TB GOLD PLUS
Mitogen-NIL: 8.07 IU/mL
NIL: 0.01 IU/mL
QuantiFERON-TB Gold Plus: NEGATIVE
TB1-NIL: 0.01 IU/mL
TB2-NIL: 0 IU/mL

## 2022-03-13 LAB — COMPLETE METABOLIC PANEL WITH GFR
AG Ratio: 1.1 (calc) (ref 1.0–2.5)
ALT: 12 U/L (ref 6–29)
AST: 17 U/L (ref 10–35)
Albumin: 3.6 g/dL (ref 3.6–5.1)
Alkaline phosphatase (APISO): 87 U/L (ref 37–153)
BUN: 18 mg/dL (ref 7–25)
CO2: 30 mmol/L (ref 20–32)
Calcium: 9.4 mg/dL (ref 8.6–10.4)
Chloride: 105 mmol/L (ref 98–110)
Creat: 0.97 mg/dL (ref 0.60–1.00)
Globulin: 3.3 g/dL (calc) (ref 1.9–3.7)
Glucose, Bld: 83 mg/dL (ref 65–99)
Potassium: 3.8 mmol/L (ref 3.5–5.3)
Sodium: 142 mmol/L (ref 135–146)
Total Bilirubin: 0.6 mg/dL (ref 0.2–1.2)
Total Protein: 6.9 g/dL (ref 6.1–8.1)
eGFR: 62 mL/min/{1.73_m2} (ref >=60)

## 2022-03-13 LAB — CBC WITH DIFFERENTIAL/PLATELET
Absolute Monocytes: 568 cells/uL (ref 200–950)
Basophils Absolute: 52 cells/uL (ref 0–200)
Basophils Relative: 0.9 %
Eosinophils Absolute: 737 cells/uL — ABNORMAL HIGH (ref 15–500)
Eosinophils Relative: 12.7 %
HCT: 39.9 % (ref 35.0–45.0)
Hemoglobin: 13.2 g/dL (ref 11.7–15.5)
Lymphs Abs: 1572 cells/uL (ref 850–3900)
MCH: 29.6 pg (ref 27.0–33.0)
MCHC: 33.1 g/dL (ref 32.0–36.0)
MCV: 89.5 fL (ref 80.0–100.0)
MPV: 9.7 fL (ref 7.5–12.5)
Monocytes Relative: 9.8 %
Neutro Abs: 2871 cells/uL (ref 1500–7800)
Neutrophils Relative %: 49.5 %
Platelets: 284 10*3/uL (ref 140–400)
RBC: 4.46 10*6/uL (ref 3.80–5.10)
RDW: 11.6 % (ref 11.0–15.0)
Total Lymphocyte: 27.1 %
WBC: 5.8 10*3/uL (ref 3.8–10.8)

## 2022-03-13 NOTE — Progress Notes (Signed)
CBC and CMP are normal.

## 2022-03-13 NOTE — Progress Notes (Signed)
TB gold negative

## 2022-03-27 ENCOUNTER — Other Ambulatory Visit: Payer: Self-pay | Admitting: *Deleted

## 2022-03-27 MED ORDER — HUMIRA (2 PEN) 40 MG/0.4ML ~~LOC~~ AJKT: 40.0000 mg | AUTO-INJECTOR | SUBCUTANEOUS | 0 refills | Status: DC

## 2022-03-27 NOTE — Telephone Encounter (Signed)
Refill request received via fax from [facility] for Humira.   Next Visit: 07/11/2022  Last Visit: 01/23/2022  Last Fill: 01/04/2022  QI:[id number] arthritis with positive rheumatoid factor  Current Dose per office note 01/23/2022: Humira 40 mg sq injection every 14 days  Labs: 03/11/2022 CBC and CMP are normal.  TB Gold: 03/11/2022 Neg   Okay to refill Humira?

## 2022-06-05 ENCOUNTER — Other Ambulatory Visit: Payer: Self-pay | Admitting: Obstetrics and Gynecology

## 2022-06-05 DIAGNOSIS — N644 Mastodynia: Secondary | ICD-10-CM

## 2022-06-13 ENCOUNTER — Ambulatory Visit: Payer: Medicare Other

## 2022-06-13 ENCOUNTER — Other Ambulatory Visit: Payer: Self-pay | Admitting: Obstetrics and Gynecology

## 2022-06-13 ENCOUNTER — Ambulatory Visit
Admission: RE | Admit: 2022-06-13 | Discharge: 2022-06-13 | Disposition: A | Discharge status: Home / Self Care | Payer: Medicare Other | Source: Ambulatory Visit | Attending: Obstetrics and Gynecology | Admitting: Obstetrics and Gynecology

## 2022-06-13 DIAGNOSIS — N644 Mastodynia: Secondary | ICD-10-CM

## 2022-06-26 ENCOUNTER — Other Ambulatory Visit: Payer: Self-pay | Admitting: *Deleted

## 2022-06-26 DIAGNOSIS — Z79899 Other long term (current) drug therapy: Secondary | ICD-10-CM

## 2022-06-26 DIAGNOSIS — M0579 Rheumatoid arthritis with rheumatoid factor of multiple sites without organ or systems involvement: Secondary | ICD-10-CM

## 2022-06-26 LAB — CBC WITH DIFFERENTIAL/PLATELET
Absolute Monocytes: 589 cells/uL (ref 200–950)
Basophils Absolute: 58 cells/uL (ref 0–200)
Basophils Relative: 0.9 %
Eosinophils Absolute: 774 cells/uL — ABNORMAL HIGH (ref 15–500)
Eosinophils Relative: 12.1 %
HCT: 39.8 % (ref 35.0–45.0)
Hemoglobin: 13.1 g/dL (ref 11.7–15.5)
Lymphs Abs: 1862 cells/uL (ref 850–3900)
MCH: 28.9 pg (ref 27.0–33.0)
MCHC: 32.9 g/dL (ref 32.0–36.0)
MCV: 87.9 fL (ref 80.0–100.0)
MPV: 10.5 fL (ref 7.5–12.5)
Monocytes Relative: 9.2 %
Neutro Abs: 3117 cells/uL (ref 1500–7800)
Neutrophils Relative %: 48.7 %
Platelets: 309 10*3/uL (ref 140–400)
RBC: 4.53 10*6/uL (ref 3.80–5.10)
RDW: 11.9 % (ref 11.0–15.0)
Total Lymphocyte: 29.1 %
WBC: 6.4 10*3/uL (ref 3.8–10.8)

## 2022-06-26 LAB — COMPLETE METABOLIC PANEL WITH GFR
AG Ratio: 1.2 (calc) (ref 1.0–2.5)
ALT: 13 U/L (ref 6–29)
AST: 20 U/L (ref 10–35)
Albumin: 3.8 g/dL (ref 3.6–5.1)
Alkaline phosphatase (APISO): 101 U/L (ref 37–153)
BUN: 17 mg/dL (ref 7–25)
CO2: 33 mmol/L — ABNORMAL HIGH (ref 20–32)
Calcium: 9.9 mg/dL (ref 8.6–10.4)
Chloride: 102 mmol/L (ref 98–110)
Creat: 0.85 mg/dL (ref 0.60–1.00)
Globulin: 3.3 g/dL (calc) (ref 1.9–3.7)
Glucose, Bld: 85 mg/dL (ref 65–99)
Potassium: 4.3 mmol/L (ref 3.5–5.3)
Sodium: 141 mmol/L (ref 135–146)
Total Bilirubin: 0.5 mg/dL (ref 0.2–1.2)
Total Protein: 7.1 g/dL (ref 6.1–8.1)
eGFR: 73 mL/min/{1.73_m2} (ref >=60)

## 2022-06-26 LAB — LIPID PANEL
Cholesterol: 214 mg/dL — ABNORMAL HIGH (ref <200)
HDL: 45 mg/dL — ABNORMAL LOW (ref >=50)
LDL Cholesterol (Calc): 146 mg/dL (calc) — ABNORMAL HIGH
Non-HDL Cholesterol (Calc): 169 mg/dL (calc) — ABNORMAL HIGH (ref <130)
Total CHOL/HDL Ratio: 4.8 (calc) (ref <5.0)
Triglycerides: 109 mg/dL (ref <150)

## 2022-06-26 MED ORDER — HUMIRA (2 PEN) 40 MG/0.4ML ~~LOC~~ AJKT: 40.0000 mg | AUTO-INJECTOR | SUBCUTANEOUS | 0 refills | Status: DC

## 2022-06-26 NOTE — Telephone Encounter (Signed)
Next Visit: 07/11/2022  Last Visit: 01/23/2022  Last Fill: 03/27/2022  DX: Rheumatoid arthritis with positive rheumatoid factor   Current Dose per office note 01/23/2022: Humira 40 mg sq injection every 14 days.   Labs: 03/11/2022 CBC and CMP are normal. Labs drawn today, pending results.   TB Gold: 03/11/2022,  TB gold negative.   Okay to refill Humira?

## 2022-07-03 NOTE — Progress Notes (Signed)
Office Visit Note  Patient: Diana [name]             Date of Birth: September 01, 1950           MRN: [id number]             PCP: Truett Perna, MD Referring: Renford Dills, MD Visit Date: 07/11/2022 Occupation: @GUAROCC @  Subjective:  Medication management  History of Present Illness: Diana [name] is a 72 y.o. female with history of seropositive rheumatoid arthritis and osteoarthritis.  She states while walking on the street on December 29 she fell face forward.  She did not lose consciousness and did not have any major injuries.  She was seen at the urgent care the next day.  A CT scan of her face was done which was unremarkable per patient.  She still has some achiness in her neck.  She has been taking Humira on a regular basis but she stopped Humira when she was placed on the antibiotics.  She finished the course of antibiotics yesterday and she will be restarting Humira.  She denies any flare of rheumatoid arthritis.  She has occasional stiffness in her hands.  Activities of Daily Living:  Patient reports morning stiffness for 30 minutes.   Patient Denies nocturnal pain.  Difficulty dressing/grooming: Denies Difficulty climbing stairs: Denies Difficulty getting out of chair: Denies Difficulty using hands for taps, buttons, cutlery, and/or writing: Denies  Review of Systems  Constitutional:  Negative for fatigue.  HENT:  Negative for mouth sores and mouth dryness.   Eyes:  Negative for dryness.  Respiratory:  Negative for shortness of breath.   Cardiovascular:  Negative for chest pain and palpitations.  Gastrointestinal:  Negative for blood in stool, constipation and diarrhea.  Endocrine: Negative for increased urination.  Genitourinary:  Negative for involuntary urination.  Musculoskeletal:  Positive for joint pain, joint pain and morning stiffness. Negative for gait problem, joint swelling, myalgias, muscle weakness, muscle tenderness and myalgias.  Skin:  Negative for color change,  rash, hair loss and sensitivity to sunlight.  Allergic/Immunologic: Negative for susceptible to infections.  Neurological:  Negative for dizziness and headaches.  Hematological:  Negative for swollen glands.  Psychiatric/Behavioral:  Negative for depressed mood and sleep disturbance. The patient is not nervous/anxious.     PMFS History:  Patient Active Problem List   Diagnosis Date Noted   H/O total knee replacement, left 08/18/2019   Osteoarthritis of left knee 06/08/2019   Unilateral primary osteoarthritis, left knee 05/25/2019   History of total right knee replacement 02/02/2019   Rheumatoid arthritis with positive rheumatoid factor  05/14/2016   High risk medication use 05/14/2016   Primary osteoarthritis of both hands 05/14/2016   G6PD deficiency 05/14/2016   S/p nephrectomy 05/14/2016   Essential hypertension 05/14/2016   Depression 05/14/2016   Sleep apnea 05/14/2016   Former smoker 05/14/2016   History of ankle fracture 05/14/2016   Acute medial meniscus tear of right knee 10/08/2011    Class: Acute   Osteoarthritis of right knee 10/08/2011    Class: Chronic    Past Medical History:  Diagnosis Date   Depression    Hyperlipidemia    Hypertension    Kidney disease    "my one kidney doesnt give me problems"    Positive PPD    works health dept   Rheumatoid arthritis(714.0)    Sleep apnea    uses cpap-mod-severe; i used to but then i got tired of it  History reviewed. No pertinent family history. Past Surgical History:  Procedure Laterality Date   ANKLE ARTHRODESIS  2010   left   BREAST LUMPECTOMY  1998   rt   BREAST SURGERY     rt breast bx   CERVICAL BIOPSY     KIDNEY SURGERY     KNEE ARTHROPLASTY     NEPHRECTOMY  2007   right   TOTAL KNEE ARTHROPLASTY Right 02/02/2019   Procedure: RIGHT TOTAL KNEE ARTHROPLASTY;  Surgeon: Garald Balding, MD;  Location: [facility];  Service: Orthopedics;  Laterality: Right;   TOTAL KNEE ARTHROPLASTY Left 06/08/2019    Procedure: LEFT TOTAL KNEE ARTHROPLASTY;  Surgeon: Garald Balding, MD;  Location: [facility];  Service: Orthopedics;  Laterality: Left;   TUBAL LIGATION     Social History   Social History Narrative   Not on file   Immunization History  Administered Date(s) Administered   Influenza, High Dose Seasonal PF 04/10/2018   PFIZER Comirnaty(Gray Top)Covid-19 Tri-Sucrose Vaccine 07/26/2019, 08/16/2019, 04/03/2020   PFIZER(Purple Top)SARS-COV-2 Vaccination 07/16/2019, 08/06/2019, 04/29/2020   Pfizer Covid-19 Vaccine Bivalent Booster 43yrs & up 04/01/2021   Pneumococcal Polysaccharide-23 04/10/2018     Objective: Vital Signs: BP 130/70 (BP Location: Left Arm, Patient Position: Sitting, Cuff Size: Normal)   Pulse 68   Resp 16   Ht 5\' 2"  (1.575 m)   Wt 150 lb 6.4 oz (68.2 kg)   BMI 27.51 kg/m    Physical Exam Vitals and nursing note reviewed.  Constitutional:      Appearance: She is well-developed.  HENT:     Head: Normocephalic and atraumatic.  Eyes:     Conjunctiva/sclera: Conjunctivae normal.  Cardiovascular:     Rate and Rhythm: Normal rate and regular rhythm.     Heart sounds: Normal heart sounds.  Pulmonary:     Effort: Pulmonary effort is normal.     Breath sounds: Normal breath sounds.  Abdominal:     General: Bowel sounds are normal.     Palpations: Abdomen is soft.  Musculoskeletal:     Cervical back: Normal range of motion.  Lymphadenopathy:     Cervical: No cervical adenopathy.  Skin:    General: Skin is warm and dry.     Capillary Refill: Capillary refill takes less than 2 seconds.  Neurological:     Mental Status: She is alert and oriented to person, place, and time.  Psychiatric:        Behavior: Behavior normal.      Musculoskeletal Exam: Cervical spine was in good range of motion.  She had no tenderness over thoracic or lumbar spine.  Shoulder joints, elbow joints, wrist joints, MCPs PIPs and DIPs Juengel range of motion with no synovitis.  Hip joints and  knee joints in good range of motion.  Both knees were replaced.  There was no tenderness over ankles or MTPs.  CDAI Exam: CDAI Score: -- Patient Global: 3 mm; Provider Global: 3 mm Swollen: --; Tender: -- Joint Exam 07/11/2022   No joint exam has been documented for this visit   There is currently no information documented on the homunculus. Go to the Rheumatology activity and complete the homunculus joint exam.  Investigation: No additional findings.  Imaging: MM DIAG BREAST TOMO BILATERAL  Result Date: 06/13/2022 CLINICAL DATA:  Constant diffuse right breast pain for the past 4 weeks. This began in the nipple area and radiated into the remainder of the breast and has subsequently become more diffuse has mildly improved  in the last 2 days. EXAM: DIGITAL DIAGNOSTIC BILATERAL MAMMOGRAM WITH TOMOSYNTHESIS TECHNIQUE: Bilateral digital diagnostic mammography and breast tomosynthesis was performed. COMPARISON:  Previous exam(s). ACR Breast Density Category b: There are scattered areas of fibroglandular density. FINDINGS: Stable mammographic appearance of the breasts with no findings suspicious for malignancy in either breast. IMPRESSION: No evidence of malignancy. RECOMMENDATION: Bilateral screening mammogram in 1 year. I have discussed the findings and recommendations with the patient. If applicable, a reminder letter will be sent to the patient regarding the next appointment. BI-RADS CATEGORY  1: Negative. Electronically Signed   By: Beckie Salts M.D.   On: 06/13/2022 13:46    Recent Labs: Lab Results  Component Value Date   WBC 6.4 06/26/2022   HGB 13.1 06/26/2022   PLT 309 06/26/2022   NA 141 06/26/2022   K 4.3 06/26/2022   CL 102 06/26/2022   CO2 33 (H) 06/26/2022   GLUCOSE 85 06/26/2022   BUN 17 06/26/2022   CREATININE 0.85 06/26/2022   BILITOT 0.5 06/26/2022   ALKPHOS 99 06/07/2019   AST 20 06/26/2022   ALT 13 06/26/2022   PROT 7.1 06/26/2022   ALBUMIN 3.7 06/07/2019    CALCIUM 9.9 06/26/2022   GFRAA 79 09/11/2020   QFTBGOLD NEGATIVE 05/07/2017   QFTBGOLDPLUS NEGATIVE 03/11/2022    Speciality Comments: No specialty comments available.  Procedures:  No procedures performed Allergies: Other, Iodine, and Latex   Assessment / Plan:     Visit Diagnoses: Rheumatoid arthritis involving multiple sites with positive rheumatoid factor (HCC)-patient had no synovitis on examination today.  She has been taking Humira 40 mg subcu every other week without interruption.  She states she stopped Humira recently after the fall because she was placed on some antibiotics by her PCP.  She will be resuming Humira this week.  She denies any side effects from Humira.  She has not any episodes of rheumatoid arthritis flare since her last visit.  High risk medication use - Humira 40 mg sq injection every 14 days.  Labs obtained on June 26, 2022 CBC and CMP were normal.  TB Gold was negative on March 11, 2022.  She was advised to get labs every 3 months to monitor for drug toxicity.  Information on immunization was placed in the AVS.  She was also advised to hold Humira if she develops an infection and resume after the infection resolves.  Annual skin examination was advised to screen for skin cancer while she is on Humira.  Primary osteoarthritis of both hands-she has mild PIP and DIP thickening her hands.  No synovitis was noted.  Joint protection muscle strengthening was discussed.  Status post total knee replacement, bilateral - 2020.  Her knee joints were in good range of motion without any warmth swelling or effusion.  Lower extremity muscle strengthening exercises were discussed.  History of ankle fracture-left 2010  G6PD deficiency  History of nephrectomy-renal functions are normal.  History of hypertension-blood pressure was normal at 130/70.  Dyslipidemia  History of recent fall-patient fell on December 29.  She states that she is not prone to falling but she  tripped while she was walking.  She declined physical therapy.  History of depression  Osteoporosis screening-patient states she has been getting DEXA scan through her GYN but she would not be following with the GYN in the future.  I advised her to get the last DEXA report so we can order DEXA scans in the future based on the date of the  last DEXA scan.  Calcium rich diet with vitamin D and exercise was emphasized.  History of sleep apnea  Ex-smoker  Orders: No orders of the defined types were placed in this encounter.  No orders of the defined types were placed in this encounter.    Follow-Up Instructions: Return in about 5 months (around 12/10/2022) for Rheumatoid arthritis.   Bo Merino, MD  Note - This record has been created using Editor, commissioning.  Chart creation errors have been sought, but may not always  have been located. Such creation errors do not reflect on  the standard of medical care.

## 2022-07-11 ENCOUNTER — Ambulatory Visit: Payer: Medicare HMO | Attending: Rheumatology | Admitting: Rheumatology

## 2022-07-11 ENCOUNTER — Encounter: Payer: Self-pay | Admitting: Rheumatology

## 2022-07-11 VITALS — BP 130/70 | HR 68 | Resp 16 | Ht 62.0 in | Wt 150.4 lb

## 2022-07-11 DIAGNOSIS — Z8679 Personal history of other diseases of the circulatory system: Secondary | ICD-10-CM

## 2022-07-11 DIAGNOSIS — Z87891 Personal history of nicotine dependence: Secondary | ICD-10-CM

## 2022-07-11 DIAGNOSIS — Z8669 Personal history of other diseases of the nervous system and sense organs: Secondary | ICD-10-CM

## 2022-07-11 DIAGNOSIS — M0579 Rheumatoid arthritis with rheumatoid factor of multiple sites without organ or systems involvement: Secondary | ICD-10-CM

## 2022-07-11 DIAGNOSIS — Z9181 History of falling: Secondary | ICD-10-CM

## 2022-07-11 DIAGNOSIS — Z8659 Personal history of other mental and behavioral disorders: Secondary | ICD-10-CM

## 2022-07-11 DIAGNOSIS — E785 Hyperlipidemia, unspecified: Secondary | ICD-10-CM

## 2022-07-11 DIAGNOSIS — Z96653 Presence of artificial knee joint, bilateral: Secondary | ICD-10-CM | POA: Diagnosis not present

## 2022-07-11 DIAGNOSIS — Z8781 Personal history of (healed) traumatic fracture: Secondary | ICD-10-CM

## 2022-07-11 DIAGNOSIS — Z13820 Encounter for screening for osteoporosis: Secondary | ICD-10-CM

## 2022-07-11 DIAGNOSIS — M19042 Primary osteoarthritis, left hand: Secondary | ICD-10-CM

## 2022-07-11 DIAGNOSIS — M19041 Primary osteoarthritis, right hand: Secondary | ICD-10-CM

## 2022-07-11 DIAGNOSIS — Z96651 Presence of right artificial knee joint: Secondary | ICD-10-CM

## 2022-07-11 DIAGNOSIS — Z79899 Other long term (current) drug therapy: Secondary | ICD-10-CM | POA: Diagnosis not present

## 2022-07-11 DIAGNOSIS — Z905 Acquired absence of kidney: Secondary | ICD-10-CM

## 2022-07-11 DIAGNOSIS — D75A Glucose-6-phosphate dehydrogenase (G6PD) deficiency without anemia: Secondary | ICD-10-CM

## 2022-07-11 DIAGNOSIS — Z96652 Presence of left artificial knee joint: Secondary | ICD-10-CM

## 2022-07-11 NOTE — Patient Instructions (Signed)
Standing Labs We placed an order today for your standing lab work.   Please have your standing labs drawn in March and every 3 months  Please have your labs drawn 2 weeks prior to your appointment so that the provider can discuss your lab results at your appointment.  Please note that you may see your imaging and lab results in [facility] before we have reviewed them. We will contact you once all results are reviewed. Please allow our office up to 72 hours to thoroughly review all of the results before contacting the office for clarification of your results.  Lab hours are:   Monday through Thursday from 8:00 am -12:30 pm and 1:00 pm-5:00 pm and Friday from 8:00 am-12:00 pm.  Please be advised, all patients with office appointments requiring lab work will take precedent over walk-in lab work.   Labs are drawn by [facility]. Please bring your co-pay at the time of your lab draw.  You may receive a bill from [facility] for your lab work.  Please note if you are on Hydroxychloroquine and and an order has been placed for a Hydroxychloroquine level, you will need to have it drawn 4 hours or more after your last dose.  If you wish to have your labs drawn at another location, please call the office 24 hours in advance so we can fax the orders.  The office is located at [address], [place], [place], [place] [address] No appointment is necessary.    If you have any questions regarding directions or hours of operation,  please call [phone].   As a reminder, please drink plenty of water prior to coming for your lab work. Thanks!   Vaccines You are taking a medication(s) that can suppress your immune system.  The following immunizations are recommended: Flu annually Covid-19  RSV Td/Tdap (tetanus, diphtheria, pertussis) every 10 years Pneumonia (Prevnar 15 then Pneumovax 23 at least 1 year apart.  Alternatively, can take Prevnar 20 without needing additional dose) Shingrix: 2 doses from 4  weeks to 6 months apart  Please check with your PCP to make sure you are up to date.  If you have signs or symptoms of an infection or start antibiotics: First, call your PCP for workup of your infection. Hold your medication through the infection, until you complete your antibiotics, and until symptoms resolve if you take the following: Injectable medication (Actemra, Benlysta, Cimzia, Cosentyx, Enbrel, Humira, Kevzara, Orencia, Remicade, Simponi, Stelara, Taltz, Tremfya) Methotrexate Leflunomide (Arava) Mycophenolate (Cellcept) Morrie Sheldon, Olumiant, or Rinvoq  Please get an annual skin examination to screen for skin cancer while you are on Humira

## 2022-09-16 ENCOUNTER — Other Ambulatory Visit: Payer: Self-pay | Admitting: *Deleted

## 2022-09-16 MED ORDER — HUMIRA (2 PEN) 40 MG/0.4ML ~~LOC~~ AJKT: 40.0000 mg | AUTO-INJECTOR | SUBCUTANEOUS | 0 refills | Status: DC

## 2022-09-16 NOTE — Telephone Encounter (Signed)
Refill request received via fax from [facility] for Humira   Next Visit: 12/16/2022  Last Visit: 07/11/2022  Last Fill: 06/26/2022  DX: Rheumatoid arthritis involving multiple sites with positive rheumatoid factor   Current Dose per office note 07/11/2022: Humira 40 mg sq injection every 14 days   Labs: 06/26/2022 CMP WNL. Absolute eosinophils are elevated-stable.  Rest of CBC WNL   TB Gold: 03/11/2022 Neg    Left message to advise patient she is due to update labs.   Okay to refill Humira?

## 2022-09-20 ENCOUNTER — Other Ambulatory Visit: Payer: Self-pay | Admitting: *Deleted

## 2022-09-20 DIAGNOSIS — Z79899 Other long term (current) drug therapy: Secondary | ICD-10-CM

## 2022-09-21 LAB — CBC WITH DIFFERENTIAL/PLATELET
Absolute Monocytes: 508 cells/uL (ref 200–950)
Basophils Absolute: 49 cells/uL (ref 0–200)
Basophils Relative: 0.9 %
Eosinophils Absolute: 610 cells/uL — ABNORMAL HIGH (ref 15–500)
Eosinophils Relative: 11.3 %
HCT: 41.2 % (ref 35.0–45.0)
Hemoglobin: 13.1 g/dL (ref 11.7–15.5)
Lymphs Abs: 1544 cells/uL (ref 850–3900)
MCH: 28.5 pg (ref 27.0–33.0)
MCHC: 31.8 g/dL — ABNORMAL LOW (ref 32.0–36.0)
MCV: 89.8 fL (ref 80.0–100.0)
MPV: 10.3 fL (ref 7.5–12.5)
Monocytes Relative: 9.4 %
Neutro Abs: 2689 cells/uL (ref 1500–7800)
Neutrophils Relative %: 49.8 %
Platelets: 300 10*3/uL (ref 140–400)
RBC: 4.59 10*6/uL (ref 3.80–5.10)
RDW: 11.8 % (ref 11.0–15.0)
Total Lymphocyte: 28.6 %
WBC: 5.4 10*3/uL (ref 3.8–10.8)

## 2022-09-21 LAB — COMPLETE METABOLIC PANEL WITH GFR
AG Ratio: 1.2 (calc) (ref 1.0–2.5)
ALT: 12 U/L (ref 6–29)
AST: 18 U/L (ref 10–35)
Albumin: 3.7 g/dL (ref 3.6–5.1)
Alkaline phosphatase (APISO): 92 U/L (ref 37–153)
BUN: 20 mg/dL (ref 7–25)
CO2: 29 mmol/L (ref 20–32)
Calcium: 9.2 mg/dL (ref 8.6–10.4)
Chloride: 104 mmol/L (ref 98–110)
Creat: 0.88 mg/dL (ref 0.60–1.00)
Globulin: 3 g/dL (calc) (ref 1.9–3.7)
Glucose, Bld: 84 mg/dL (ref 65–99)
Potassium: 3.8 mmol/L (ref 3.5–5.3)
Sodium: 141 mmol/L (ref 135–146)
Total Bilirubin: 0.5 mg/dL (ref 0.2–1.2)
Total Protein: 6.7 g/dL (ref 6.1–8.1)
eGFR: 70 mL/min/{1.73_m2} (ref >=60)

## 2022-10-07 ENCOUNTER — Other Ambulatory Visit: Payer: Self-pay | Admitting: *Deleted

## 2022-10-07 MED ORDER — HUMIRA (2 PEN) 40 MG/0.4ML ~~LOC~~ AJKT: 40.0000 mg | AUTO-INJECTOR | SUBCUTANEOUS | 0 refills | Status: DC

## 2022-10-07 NOTE — Telephone Encounter (Signed)
Refill request received via fax from [facility] for Humira  Last Fill: 09/16/2022 (30 day supply)  Labs: 09/20/2022 CMP WNL Absolute eosinophils are borderline elevated but trending down. Rest of CBC WNL  TB Gold: 03/11/2022 Neg    Next Visit: 12/16/2022  Last Visit: 07/11/2022  DX: Rheumatoid arthritis involving multiple sites with positive rheumatoid factor   Current Dose per office note 07/11/2022: Humira 40 mg sq injection every 14 days.   Okay to refill Humira?

## 2022-12-02 NOTE — Progress Notes (Signed)
Office Visit Note  Patient: Diana [name]             Date of Birth: 1950-07-21           MRN: [id number]             PCP: Truett Perna, MD Referring: Truett Perna, MD Visit Date: 12/16/2022 Occupation: @GUAROCC @  Subjective:  Medication monitoring  History of Present Illness: Diana [name] is a 72 y.o. female with history of seropositive rheumatoid arthritis and osteoarthritis.  Patient remains on humira 40 mg sq injections every 14 days.  She is tolerating Humira without any side effects or injection site reactions.  She has not had any signs or symptoms of a rheumatoid arthritis flare.  She remains active walking and standing throughout the day.  Her morning stiffness is only been lasting a few minutes daily.  She has not had any nocturnal pain.  She denies any difficulty with ADLs.  She denies any recent or recurrent infections.   Activities of Daily Living:  Patient reports morning stiffness for a few minutes.   Patient Denies nocturnal pain.  Difficulty dressing/grooming: Denies Difficulty climbing stairs: Denies Difficulty getting out of chair: Denies Difficulty using hands for taps, buttons, cutlery, and/or writing: Denies  Review of Systems  Constitutional:  Negative for fatigue.  HENT:  Negative for mouth sores and mouth dryness.   Eyes:  Negative for dryness.  Respiratory:  Negative for shortness of breath.   Cardiovascular:  Negative for chest pain and palpitations.  Gastrointestinal:  Negative for blood in stool, constipation and diarrhea.  Endocrine: Negative for increased urination.  Genitourinary:  Negative for involuntary urination.  Musculoskeletal:  Positive for morning stiffness. Negative for joint pain, gait problem, joint pain, joint swelling, myalgias, muscle weakness, muscle tenderness and myalgias.  Skin:  Negative for color change, rash, hair loss and sensitivity to sunlight.  Allergic/Immunologic: Negative for susceptible to infections.  Neurological:   Negative for dizziness and headaches.  Hematological:  Negative for swollen glands.  Psychiatric/Behavioral:  Negative for depressed mood and sleep disturbance. The patient is not nervous/anxious.     PMFS History:  Patient Active Problem List   Diagnosis Date Noted   H/O total knee replacement, left 08/18/2019   Osteoarthritis of left knee 06/08/2019   Unilateral primary osteoarthritis, left knee 05/25/2019   History of total right knee replacement 02/02/2019   Rheumatoid arthritis with positive rheumatoid factor  05/14/2016   High risk medication use 05/14/2016   Primary osteoarthritis of both hands 05/14/2016   G6PD deficiency 05/14/2016   S/p nephrectomy 05/14/2016   Essential hypertension 05/14/2016   Depression 05/14/2016   Sleep apnea 05/14/2016   Former smoker 05/14/2016   History of ankle fracture 05/14/2016   Acute medial meniscus tear of right knee 10/08/2011    Class: Acute   Osteoarthritis of right knee 10/08/2011    Class: Chronic    Past Medical History:  Diagnosis Date   Depression    Hyperlipidemia    Hypertension    Kidney disease    "my one kidney doesnt give me problems"    Positive PPD    works health dept   Rheumatoid arthritis(714.0)    Sleep apnea    uses cpap-mod-severe; i used to but then i got tired of it     History reviewed. No pertinent family history. Past Surgical History:  Procedure Laterality Date   ANKLE ARTHRODESIS  2010   left   BREAST LUMPECTOMY  1998  rt   BREAST SURGERY     rt breast bx   CERVICAL BIOPSY     KIDNEY SURGERY     KNEE ARTHROPLASTY     NEPHRECTOMY  2007   right   TOTAL KNEE ARTHROPLASTY Right 02/02/2019   Procedure: RIGHT TOTAL KNEE ARTHROPLASTY;  Surgeon: Valeria Batman, MD;  Location: [facility];  Service: Orthopedics;  Laterality: Right;   TOTAL KNEE ARTHROPLASTY Left 06/08/2019   Procedure: LEFT TOTAL KNEE ARTHROPLASTY;  Surgeon: Valeria Batman, MD;  Location: [facility];  Service: Orthopedics;   Laterality: Left;   TUBAL LIGATION     Social History   Social History Narrative   Not on file   Immunization History  Administered Date(s) Administered   Influenza, High Dose Seasonal PF 04/10/2018   PFIZER Comirnaty(Gray Top)Covid-19 Tri-Sucrose Vaccine 07/26/2019, 08/16/2019, 04/03/2020   PFIZER(Purple Top)SARS-COV-2 Vaccination 07/16/2019, 08/06/2019, 04/29/2020   Pfizer Covid-19 Vaccine Bivalent Booster 9yrs & up 04/01/2021   Pneumococcal Polysaccharide-23 04/10/2018     Objective: Vital Signs: BP 125/68 (BP Location: Left Arm, Patient Position: Sitting, Cuff Size: Normal)   Pulse 75   Resp 16   Ht 5\' 2"  (1.575 m)   Wt 148 lb 6.4 oz (67.3 kg)   BMI 27.14 kg/m    Physical Exam Vitals and nursing note reviewed.  Constitutional:      Appearance: She is well-developed.  HENT:     Head: Normocephalic and atraumatic.  Eyes:     Conjunctiva/sclera: Conjunctivae normal.  Cardiovascular:     Rate and Rhythm: Normal rate and regular rhythm.     Heart sounds: Normal heart sounds.  Pulmonary:     Effort: Pulmonary effort is normal.     Breath sounds: Normal breath sounds.  Abdominal:     General: Bowel sounds are normal.     Palpations: Abdomen is soft.  Musculoskeletal:     Cervical back: Normal range of motion.  Lymphadenopathy:     Cervical: No cervical adenopathy.  Skin:    General: Skin is warm and dry.     Capillary Refill: Capillary refill takes less than 2 seconds.  Neurological:     Mental Status: She is alert and oriented to person, place, and time.  Psychiatric:        Behavior: Behavior normal.      Musculoskeletal Exam: C-spine, thoracic spine, lumbar spine have good range of motion.  Shoulder joints, elbow joints, wrist joints, MCPs, PIPs, DIPs have good range of motion with no synovitis.  PIP and DIP thickening consistent with OA of both hands.  Complete fist formation bilaterally.  Hip joints have good range of motion with no groin pain.  Knee  replacements have good ROM with no warmth or effusion.  Ankle joints have good ROM with no tenderness or joint swelling.   CDAI Exam: CDAI Score: -- Patient Global: 3 / 100; Provider Global: 3 / 100 Swollen: --; Tender: -- Joint Exam 12/16/2022   No joint exam has been documented for this visit   There is currently no information documented on the homunculus. Go to the Rheumatology activity and complete the homunculus joint exam.  Investigation: No additional findings.  Imaging: No results found.  Recent Labs: Lab Results  Component Value Date   WBC 5.4 09/20/2022   HGB 13.1 09/20/2022   PLT 300 09/20/2022   NA 141 09/20/2022   K 3.8 09/20/2022   CL 104 09/20/2022   CO2 29 09/20/2022   GLUCOSE 84 09/20/2022  BUN 20 09/20/2022   CREATININE 0.88 09/20/2022   BILITOT 0.5 09/20/2022   ALKPHOS 99 06/07/2019   AST 18 09/20/2022   ALT 12 09/20/2022   PROT 6.7 09/20/2022   ALBUMIN 3.7 06/07/2019   CALCIUM 9.2 09/20/2022   GFRAA 79 09/11/2020   QFTBGOLD NEGATIVE 05/07/2017   QFTBGOLDPLUS NEGATIVE 03/11/2022    Speciality Comments: No specialty comments available.  Procedures:  No procedures performed Allergies: Other, Iodine, and Latex   Assessment / Plan:     Visit Diagnoses: Rheumatoid arthritis involving multiple sites with positive rheumatoid factor (HCC): She has no synovitis on examination today.  She has not had any signs or symptoms of a rheumatoid arthritis flare.  She has clinically been doing well on Humira 40 mg subcutaneous injections every 14 days.  She is tolerating Humira without any side effects or injection site reactions.  Her morning stiffness has only been lasting a few minutes daily.  She has not had any nocturnal pain or difficulty with ADLs.  She will remain on Humira 40 mg subcutaneous injections every 14 days.  She was advised to notify us if she develops signs or symptoms of a flare.  She will follow-up in the office in 5 months or sooner if  needed.  High risk medication use: Humira 40 mg sq injections every 14 days.   CBC and CMP updated on 09/20/22. Orders for CBC and CMP released today.  Her next lab work will be due in September and every 3 months to monitor for drug toxicity. TB gold negative on 03/11/22.  Future order for TB Gold placed today. No recent or recurrent infections.  Discussed the importance of holding humira if she develops signs or symptoms of an infection and to resume once the infection has completely cleared.  Discussed the importance of yearly skin examinations while on Humira.  - Plan: COMPLETE METABOLIC PANEL WITH GFR, CBC with Differential/Platelet, QuantiFERON-TB Gold Plus  Screening for tuberculosis - Order for TB gold released today. Plan: QuantiFERON-TB Gold Plus  Vitamin D deficiency - Vitamin D level will be updated today. Plan: VITAMIN D 25 Hydroxy (Vit-D Deficiency, Fractures)  Thyroid disorder screening -Thyroid panel obtained today.  Results will be forwarded to PCP as requested.  Plan: Thyroid Panel With TSH  Primary osteoarthritis of both hands: PIP and DIP thickening consistent with osteoarthritis of both hands.  No inflammation noted.  Complete fist formation bilaterally.  Status post total knee replacement, bilateral:  Doing well.  Stiffness after sitting for prolonged periods of time.  No warmth or effusion noted.  History of ankle fracture-left 2010: No tenderness or inflammation noted.  Other medical conditions are listed as follows:  G6PD deficiency  History of nephrectomy  History of hypertension:BP 125/68 today in the office.   Dyslipidemia - Lipid panel updated today. Plan: Lipid panel  History of recent fall  History of depression  Ex-smoker  History of sleep apnea   Orders: Orders Placed This Encounter  Procedures   COMPLETE METABOLIC PANEL WITH GFR   CBC with Differential/Platelet   QuantiFERON-TB Gold Plus   VITAMIN D 25 Hydroxy (Vit-D Deficiency, Fractures)    Lipid panel   Thyroid Panel With TSH   No orders of the defined types were placed in this encounter.    Follow-Up Instructions: Return in about 5 months (around 05/18/2023) for Rheumatoid arthritis, Osteoarthritis.   Gearldine Bienenstock, PA-C  Note - This record has been created using Dragon software.  Chart creation errors have been  sought, but may not always  have been located. Such creation errors do not reflect on  the standard of medical care.

## 2022-12-16 ENCOUNTER — Ambulatory Visit: Payer: Medicare Other | Attending: Physician Assistant | Admitting: Physician Assistant

## 2022-12-16 ENCOUNTER — Encounter: Payer: Self-pay | Admitting: Physician Assistant

## 2022-12-16 VITALS — BP 125/68 | HR 75 | Resp 16 | Ht 62.0 in | Wt 148.4 lb

## 2022-12-16 DIAGNOSIS — Z8669 Personal history of other diseases of the nervous system and sense organs: Secondary | ICD-10-CM

## 2022-12-16 DIAGNOSIS — Z87891 Personal history of nicotine dependence: Secondary | ICD-10-CM

## 2022-12-16 DIAGNOSIS — E785 Hyperlipidemia, unspecified: Secondary | ICD-10-CM

## 2022-12-16 DIAGNOSIS — E559 Vitamin D deficiency, unspecified: Secondary | ICD-10-CM

## 2022-12-16 DIAGNOSIS — M19041 Primary osteoarthritis, right hand: Secondary | ICD-10-CM

## 2022-12-16 DIAGNOSIS — Z79899 Other long term (current) drug therapy: Secondary | ICD-10-CM

## 2022-12-16 DIAGNOSIS — Z905 Acquired absence of kidney: Secondary | ICD-10-CM

## 2022-12-16 DIAGNOSIS — Z9181 History of falling: Secondary | ICD-10-CM

## 2022-12-16 DIAGNOSIS — M0579 Rheumatoid arthritis with rheumatoid factor of multiple sites without organ or systems involvement: Secondary | ICD-10-CM | POA: Diagnosis not present

## 2022-12-16 DIAGNOSIS — Z8659 Personal history of other mental and behavioral disorders: Secondary | ICD-10-CM

## 2022-12-16 DIAGNOSIS — Z96653 Presence of artificial knee joint, bilateral: Secondary | ICD-10-CM | POA: Diagnosis not present

## 2022-12-16 DIAGNOSIS — Z1329 Encounter for screening for other suspected endocrine disorder: Secondary | ICD-10-CM

## 2022-12-16 DIAGNOSIS — M19042 Primary osteoarthritis, left hand: Secondary | ICD-10-CM

## 2022-12-16 DIAGNOSIS — D75A Glucose-6-phosphate dehydrogenase (G6PD) deficiency without anemia: Secondary | ICD-10-CM

## 2022-12-16 DIAGNOSIS — Z8781 Personal history of (healed) traumatic fracture: Secondary | ICD-10-CM

## 2022-12-16 DIAGNOSIS — Z111 Encounter for screening for respiratory tuberculosis: Secondary | ICD-10-CM

## 2022-12-16 DIAGNOSIS — Z8679 Personal history of other diseases of the circulatory system: Secondary | ICD-10-CM

## 2022-12-16 NOTE — Patient Instructions (Signed)
Standing Labs We placed an order today for your standing lab work.   Please have your standing labs drawn in September and every 3 months   Please have your labs drawn 2 weeks prior to your appointment so that the provider can discuss your lab results at your appointment, if possible.  Please note that you may see your imaging and lab results in MyChart before we have reviewed them. We will contact you once all results are reviewed. Please allow our office up to 72 hours to thoroughly review all of the results before contacting the office for clarification of your results.  WALK-IN LAB HOURS  Monday through Thursday from 8:00 am -12:30 pm and 1:00 pm-5:00 pm and Friday from 8:00 am-12:00 pm.  Patients with office visits requiring labs will be seen before walk-in labs.  You may encounter longer than normal wait times. Please allow additional time. Wait times may be shorter on  Monday and Thursday afternoons.  We do not book appointments for walk-in labs. We appreciate your patience and understanding with our staff.   Labs are drawn by [facility]. Please bring your co-pay at the time of your lab draw.  You may receive a bill from [facility] for your lab work.  Please note if you are on Hydroxychloroquine and and an order has been placed for a Hydroxychloroquine level,  you will need to have it drawn 4 hours or more after your last dose.  If you wish to have your labs drawn at another location, please call the office 24 hours in advance so we can fax the orders.  The office is located at [address], [address], [place], [place] [address]   If you have any questions regarding directions or hours of operation,  please call [phone].   As a reminder, please drink plenty of water prior to coming for your lab work. Thanks!  

## 2022-12-17 LAB — LIPID PANEL
Cholesterol: 231 mg/dL — ABNORMAL HIGH (ref <200)
HDL: 45 mg/dL — ABNORMAL LOW (ref >=50)
LDL Cholesterol (Calc): 165 mg/dL (calc) — ABNORMAL HIGH
Non-HDL Cholesterol (Calc): 186 mg/dL (calc) — ABNORMAL HIGH (ref <130)
Total CHOL/HDL Ratio: 5.1 (calc) — ABNORMAL HIGH (ref <5.0)
Triglycerides: 100 mg/dL (ref <150)

## 2022-12-17 LAB — CBC WITH DIFFERENTIAL/PLATELET
Absolute Monocytes: 599 cells/uL (ref 200–950)
Basophils Absolute: 50 cells/uL (ref 0–200)
Basophils Relative: 0.8 %
Eosinophils Absolute: 762 cells/uL — ABNORMAL HIGH (ref 15–500)
Eosinophils Relative: 12.1 %
HCT: 41.7 % (ref 35.0–45.0)
Hemoglobin: 13.3 g/dL (ref 11.7–15.5)
Lymphs Abs: 1701 cells/uL (ref 850–3900)
MCH: 28.3 pg (ref 27.0–33.0)
MCHC: 31.9 g/dL — ABNORMAL LOW (ref 32.0–36.0)
MCV: 88.7 fL (ref 80.0–100.0)
MPV: 10.4 fL (ref 7.5–12.5)
Monocytes Relative: 9.5 %
Neutro Abs: 3188 cells/uL (ref 1500–7800)
Neutrophils Relative %: 50.6 %
Platelets: 299 10*3/uL (ref 140–400)
RBC: 4.7 10*6/uL (ref 3.80–5.10)
RDW: 11.7 % (ref 11.0–15.0)
Total Lymphocyte: 27 %
WBC: 6.3 10*3/uL (ref 3.8–10.8)

## 2022-12-17 LAB — COMPLETE METABOLIC PANEL WITH GFR
AG Ratio: 1.2 (calc) (ref 1.0–2.5)
ALT: 10 U/L (ref 6–29)
AST: 17 U/L (ref 10–35)
Albumin: 3.9 g/dL (ref 3.6–5.1)
Alkaline phosphatase (APISO): 100 U/L (ref 37–153)
BUN: 23 mg/dL (ref 7–25)
CO2: 34 mmol/L — ABNORMAL HIGH (ref 20–32)
Calcium: 9.7 mg/dL (ref 8.6–10.4)
Chloride: 104 mmol/L (ref 98–110)
Creat: 1 mg/dL (ref 0.60–1.00)
Globulin: 3.2 g/dL (calc) (ref 1.9–3.7)
Glucose, Bld: 86 mg/dL (ref 65–99)
Potassium: 4.1 mmol/L (ref 3.5–5.3)
Sodium: 143 mmol/L (ref 135–146)
Total Bilirubin: 0.6 mg/dL (ref 0.2–1.2)
Total Protein: 7.1 g/dL (ref 6.1–8.1)
eGFR: 59 mL/min/{1.73_m2} — ABNORMAL LOW (ref >=60)

## 2022-12-17 LAB — THYROID PANEL WITH TSH
Free Thyroxine Index: 2.5 (ref 1.4–3.8)
T3 Uptake: 32 % (ref 22–35)
T4, Total: 7.8 ug/dL (ref 5.1–11.9)
TSH: 3.57 mIU/L (ref 0.40–4.50)

## 2022-12-17 LAB — VITAMIN D 25 HYDROXY (VIT D DEFICIENCY, FRACTURES): Vit D, 25-Hydroxy: 103 ng/mL — ABNORMAL HIGH (ref 30–100)

## 2022-12-17 NOTE — Progress Notes (Signed)
GFR is borderline low-59.  Please clarify if she has been taking any NSAIDs? Please encourage patient to increase water intake for adequate hydration. Rest of CMP WNL CBC stable.    Please forward results to PCP as requested-- Vitamin D is borderline elevated-103.  Thyroid panel WNL. Total cholesterol is elevated-231. LDL is elevated-165.

## 2023-01-06 ENCOUNTER — Other Ambulatory Visit: Payer: Self-pay | Admitting: *Deleted

## 2023-01-06 MED ORDER — HUMIRA (2 PEN) 40 MG/0.4ML ~~LOC~~ AJKT: 40.0000 mg | AUTO-INJECTOR | SUBCUTANEOUS | 0 refills | Status: DC

## 2023-01-06 NOTE — Telephone Encounter (Signed)
Last Fill: 10/07/2022  Labs: 12/16/2022 GFR is borderline low-59.  Please clarify if she has been taking any NSAIDs? Please encourage patient to increase water intake for adequate hydration. Rest of CMP WNL CBC stable.   Vitamin D is borderline elevated-103. Thyroid panel WNL. Total cholesterol is elevated-231. LDL is elevated-165.  TB Gold: 03/11/2022  TB gold negative.   Next Visit: 06/04/2023  Last Visit: 12/16/2022  [id number] arthritis involving multiple sites with positive rheumatoid factor   Current Dose per office note 12/16/2022: : Humira 40 mg sq injections every 14 days.   Okay to refill Humira?

## 2023-03-19 ENCOUNTER — Telehealth: Payer: Self-pay | Admitting: Rheumatology

## 2023-03-19 ENCOUNTER — Other Ambulatory Visit: Payer: Self-pay | Admitting: *Deleted

## 2023-03-19 DIAGNOSIS — Z79899 Other long term (current) drug therapy: Secondary | ICD-10-CM

## 2023-03-19 DIAGNOSIS — Z111 Encounter for screening for respiratory tuberculosis: Secondary | ICD-10-CM

## 2023-03-19 NOTE — Telephone Encounter (Signed)
Attempted to contact the patient and left message for patient to call the office.

## 2023-03-19 NOTE — Telephone Encounter (Signed)
Patient advised Humira is a long term medication for management of RA. Patient states she is not having pain with the injections. Patient states her concern is  being on the Humira so long and not seeing dermatology yet. Patient advised Medication alternatives can be discussed at upcoming visit if she does not want to continue Humira. Patient advised that she will need to see dermatology yearly for many of our medications. Patient states she will call to set up an appointment with dermatology.

## 2023-03-19 NOTE — Telephone Encounter (Signed)
Humira is a long term medication for management of RA. How long has she been experiencing pain with injections? Please clarify injection technique/how long is she keeping the injection out at room temperature before administering?  Medication alternatives can be discussed at upcoming visit if she does not want to continue Humira.

## 2023-03-19 NOTE — Telephone Encounter (Signed)
Patient is at the office today for [facility] and is asking how much longer she has to take her Humira injections.  Patient states the shots are painful.  Patient requested a return call.

## 2023-03-20 NOTE — Progress Notes (Signed)
CBC stable. CMP WNL.

## 2023-03-22 LAB — CBC WITH DIFFERENTIAL/PLATELET
Absolute Monocytes: 563 cells/uL (ref 200–950)
Basophils Absolute: 29 cells/uL (ref 0–200)
Basophils Relative: 0.5 %
Eosinophils Absolute: 702 cells/uL — ABNORMAL HIGH (ref 15–500)
Eosinophils Relative: 12.1 %
HCT: 45.1 % — ABNORMAL HIGH (ref 35.0–45.0)
Hemoglobin: 14 g/dL (ref 11.7–15.5)
Lymphs Abs: 1711 cells/uL (ref 850–3900)
MCH: 28.1 pg (ref 27.0–33.0)
MCHC: 31 g/dL — ABNORMAL LOW (ref 32.0–36.0)
MCV: 90.6 fL (ref 80.0–100.0)
MPV: 10 fL (ref 7.5–12.5)
Monocytes Relative: 9.7 %
Neutro Abs: 2796 cells/uL (ref 1500–7800)
Neutrophils Relative %: 48.2 %
Platelets: 309 10*3/uL (ref 140–400)
RBC: 4.98 10*6/uL (ref 3.80–5.10)
RDW: 11.9 % (ref 11.0–15.0)
Total Lymphocyte: 29.5 %
WBC: 5.8 10*3/uL (ref 3.8–10.8)

## 2023-03-22 LAB — COMPLETE METABOLIC PANEL WITH GFR
AG Ratio: 1.2 (calc) (ref 1.0–2.5)
ALT: 15 U/L (ref 6–29)
AST: 22 U/L (ref 10–35)
Albumin: 3.9 g/dL (ref 3.6–5.1)
Alkaline phosphatase (APISO): 106 U/L (ref 37–153)
BUN: 15 mg/dL (ref 7–25)
CO2: 31 mmol/L (ref 20–32)
Calcium: 9.8 mg/dL (ref 8.6–10.4)
Chloride: 103 mmol/L (ref 98–110)
Creat: 0.87 mg/dL (ref 0.60–1.00)
Globulin: 3.3 g/dL (calc) (ref 1.9–3.7)
Glucose, Bld: 87 mg/dL (ref 65–99)
Potassium: 4.1 mmol/L (ref 3.5–5.3)
Sodium: 141 mmol/L (ref 135–146)
Total Bilirubin: 0.5 mg/dL (ref 0.2–1.2)
Total Protein: 7.2 g/dL (ref 6.1–8.1)
eGFR: 70 mL/min/{1.73_m2} (ref >=60)

## 2023-03-22 LAB — QUANTIFERON-TB GOLD PLUS
Mitogen-NIL: 2.11 IU/mL
NIL: 0.03 IU/mL
QuantiFERON-TB Gold Plus: NEGATIVE
TB1-NIL: 0.01 IU/mL
TB2-NIL: 0.02 IU/mL

## 2023-03-24 NOTE — Progress Notes (Signed)
TB gold negative

## 2023-04-29 NOTE — Progress Notes (Unsigned)
Office Visit Note  Patient: Diana [name]             Date of Birth: August 24, 1950           MRN: [id number]             PCP: Diana Perna, MD Referring: Diana Perna, MD Visit Date: 05/13/2023 Occupation: @GUAROCC @  Subjective:  Medication monitoring   History of Present Illness: Diana [name] is a 72 y.o. female with history of seropositive rheumatoid arthritis and osteoarthritis.  Patient remains on Humira 40 mg sq injections every 14 days.  She continues to tolerate Humira without any side effects or injection site reactions.  She has not had any recent interruptions in therapy.  She denies any recent or recurrent infections.  She is up-to-date with annual flu shot and COVID-vaccine.  She denies any signs or symptoms of a rheumatoid arthritis flare.  Patient states that most days her pain is only a 2 out of 10 on the pain scale.  She takes Tylenol sparingly and uses topical agents such as Vicks or Biofreeze on her knee replacements for pain relief.  She denies any joint swelling.       Activities of Daily Living:  Patient reports morning stiffness for a few minutes.   Patient Denies nocturnal pain.  Difficulty dressing/grooming: Denies Difficulty climbing stairs: Denies Difficulty getting out of chair: Denies Difficulty using hands for taps, buttons, cutlery, and/or writing: Denies  Review of Systems  Constitutional:  Negative for fatigue.  HENT:  Negative for mouth sores and mouth dryness.   Eyes:  Negative for dryness.  Respiratory:  Negative for shortness of breath.   Cardiovascular:  Negative for chest pain and palpitations.  Gastrointestinal:  Negative for blood in stool, constipation and diarrhea.  Endocrine: Negative for increased urination.  Genitourinary:  Negative for involuntary urination.  Musculoskeletal:  Positive for joint pain and joint pain. Negative for gait problem, joint swelling, myalgias, muscle weakness, morning stiffness, muscle tenderness and myalgias.   Skin:  Negative for color change, rash, hair loss and sensitivity to sunlight.  Allergic/Immunologic: Negative for susceptible to infections.  Neurological:  Negative for dizziness and headaches.  Hematological:  Negative for swollen glands.  Psychiatric/Behavioral:  Negative for depressed mood and sleep disturbance. The patient is not nervous/anxious.     PMFS History:  Patient Active Problem List   Diagnosis Date Noted  . H/O total knee replacement, left 08/18/2019  . Osteoarthritis of left knee 06/08/2019  . Unilateral primary osteoarthritis, left knee 05/25/2019  . History of total right knee replacement 02/02/2019  . Rheumatoid arthritis with positive rheumatoid factor  05/14/2016  . High risk medication use 05/14/2016  . Primary osteoarthritis of both hands 05/14/2016  . G6PD deficiency 05/14/2016  . S/p nephrectomy 05/14/2016  . Essential hypertension 05/14/2016  . Depression 05/14/2016  . Sleep apnea 05/14/2016  . Former smoker 05/14/2016  . History of ankle fracture 05/14/2016  . Acute medial meniscus tear of right knee 10/08/2011    Class: Acute  . Osteoarthritis of right knee 10/08/2011    Class: Chronic    Past Medical History:  Diagnosis Date  . Depression   . Hyperlipidemia   . Hypertension   . Kidney disease    "my one kidney doesnt give me problems"   . Positive PPD    works Engineer, maintenance ([employer])  . Rheumatoid arthritis(714.0)   . Sleep apnea    uses cpap-mod-severe; i used to but then i got tired  of it     History reviewed. No pertinent family history. Past Surgical History:  Procedure Laterality Date  . ANKLE ARTHRODESIS  2010   left  . BREAST LUMPECTOMY  1998   rt  . BREAST SURGERY     rt breast bx  . CERVICAL BIOPSY    . KIDNEY SURGERY    . KNEE ARTHROPLASTY    . NEPHRECTOMY  2007   right  . TOTAL KNEE ARTHROPLASTY Right 02/02/2019   Procedure: RIGHT TOTAL KNEE ARTHROPLASTY;  Surgeon: Valeria Batman, MD;  Location: [facility];  Service: Orthopedics;   Laterality: Right;  . TOTAL KNEE ARTHROPLASTY Left 06/08/2019   Procedure: LEFT TOTAL KNEE ARTHROPLASTY;  Surgeon: Valeria Batman, MD;  Location: [facility];  Service: Orthopedics;  Laterality: Left;  . TUBAL LIGATION     Social History   Social History Narrative  . Not on file   Immunization History  Administered Date(s) Administered  . Influenza, High Dose Seasonal PF 04/10/2018  . PFIZER Comirnaty(Gray Top)Covid-19 Tri-Sucrose Vaccine 04/03/2020  . PFIZER(Purple Top)SARS-COV-2 Vaccination 07/16/2019, 08/06/2019, 04/29/2020  . 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 Pneumococcal Polysaccharide-23 04/10/2018     Objective: Vital Signs: BP 119/70 (BP Location: Left Arm, Patient Position: Sitting, Cuff Size: Normal)   Pulse 84   Resp 14   Ht 5\' 2"  (1.575 m)   Wt 151 lb 3.2 oz (68.6 kg)   BMI 27.65 kg/m    Physical Exam Vitals and nursing note reviewed.  Constitutional:      Appearance: She is well-developed.  HENT:     Head: Normocephalic and atraumatic.  Eyes:     Conjunctiva/sclera: Conjunctivae normal.  Cardiovascular:     Rate and Rhythm: Normal rate and regular rhythm.     Heart sounds: Normal heart sounds.  Pulmonary:     Effort: Pulmonary effort is normal.     Breath sounds: Normal breath sounds.  Abdominal:     General: Bowel sounds are normal.     Palpations: Abdomen is soft.  Musculoskeletal:     Cervical back: Normal range of motion.  Lymphadenopathy:     Cervical: No cervical adenopathy.  Skin:    General: Skin is warm and dry.     Capillary Refill: Capillary refill takes less than 2 seconds.  Neurological:     Mental Status: She is alert and oriented to person, place, and time.  Psychiatric:        Behavior: Behavior normal.     Musculoskeletal Exam: C-spine, thoracic spine, and lumbar spine good ROM.  Shoulder joints, elbow joints, wrist joints, MCPs, PIPs, and DIPs good ROM with no synovitis.  Complete fist formation  bilaterally.  Hip joints have good ROM with no groin pain. Bilateral knee replacements have good ROM.  Ankle joints have good ROM with no tenderness or joint swelling.   CDAI Exam: CDAI Score: -- Patient Global: --; Provider Global: -- Swollen: --; Tender: -- Joint Exam 05/13/2023   No joint exam has been documented for this visit   There is currently no information documented on the homunculus. Go to the Rheumatology activity and complete the homunculus joint exam.  Investigation: No additional findings.  Imaging: No results found.  Recent Labs: Lab Results  Component Value Date   WBC 5.8 03/19/2023   HGB 14.0 03/19/2023   PLT 309 03/19/2023   NA 141 03/19/2023   K 4.1 03/19/2023   CL 103 03/19/2023   CO2 31 03/19/2023  GLUCOSE 87 03/19/2023   BUN 15 03/19/2023   CREATININE 0.87 03/19/2023   BILITOT 0.5 03/19/2023   ALKPHOS 99 06/07/2019   AST 22 03/19/2023   ALT 15 03/19/2023   PROT 7.2 03/19/2023   ALBUMIN 3.7 06/07/2019   CALCIUM 9.8 03/19/2023   GFRAA 79 09/11/2020   QFTBGOLD NEGATIVE 05/07/2017   QFTBGOLDPLUS NEGATIVE 03/19/2023    Speciality Comments: No specialty comments available.  Procedures:  No procedures performed Allergies: Other, Iodine, and Latex    Assessment / Plan:     Visit Diagnoses: Rheumatoid arthritis involving multiple sites with positive rheumatoid factor (HCC): She has no tenderness or synovitis on examination today.  She has not had any signs or symptoms of a rheumatoid arthritis flare.  She has clinically been doing well on Humira 40 mg subcutaneous injections every 14 days.  She is tolerating Humira without any side effects or injection site reactions.  She has not had any gaps in therapy recently.  No recent or recurrent infections.  She has not been experiencing any nocturnal pain or difficulty with ADLs.  She only has morning stiffness lasting a few minutes daily.  Patient will remain on Humira as monotherapy.  She was vies notify  us if she develops signs or symptoms of a flare.  She will follow-up in the office in 5 months or sooner if needed.  High risk medication use - Humira 40 mg sq injections every 14 days. CBC and CMP updated on 03/19/23.  Her next lab work will be due in December and every 3 months. Standing orders for CBC and CMP remain in place.  TB gold negative on 03/19/23. No recent or recurrent infections. Discussed the importance of holding humira if she develops signs or symptoms of an infection and to resume once the infection has completely cleared.   Primary osteoarthritis of both hands: PIP and DIP thickening consistent with osteoarthritis of both hands.  No tenderness or inflammation noted on exam.  Complete fist formation bilaterally.  Status post total knee replacement, bilateral: She experiences intermittent discomfort and stiffness in both knee replacements.  She uses topical agents such as Vicks vapor rub or Biofreeze for pain relief.  She occasionally will take Tylenol.  No effusion noted on examination today.  History of ankle fracture-left 2010  Other medical conditions are listed as follows:   G6PD deficiency  History of nephrectomy  History of hypertension: BP was 119/70 today in the office.   Dyslipidemia  History of depression  Ex-smoker  History of sleep apnea  Vitamin D deficiency  Orders: No orders of the defined types were placed in this encounter.  No orders of the defined types were placed in this encounter.  Follow-Up Instructions: Return in about 5 months (around 10/11/2023) for Rheumatoid arthritis, Osteoarthritis.   Gearldine Bienenstock, PA-C  Note - This record has been created using Dragon software.  Chart creation errors have been sought, but may not always  have been located. Such creation errors do not reflect on  the standard of medical care.

## 2023-05-05 ENCOUNTER — Other Ambulatory Visit: Payer: Self-pay | Admitting: *Deleted

## 2023-05-05 MED ORDER — HUMIRA (2 PEN) 40 MG/0.4ML ~~LOC~~ AJKT: 40.0000 mg | AUTO-INJECTOR | SUBCUTANEOUS | 0 refills | Status: DC

## 2023-05-05 NOTE — Telephone Encounter (Signed)
Refill request received via fax from [facility] for Humira  Last Fill: 01/06/2023  Labs: 03/19/2023 CBC stable. CMP WNL  TB Gold: 03/19/2023 Neg    Next Visit: 05/13/2023  Last Visit: 12/16/2022  DX: Rheumatoid arthritis involving multiple sites with positive rheumatoid factor   Current Dose per office note 12/16/2022: Humira 40 mg sq injections every 14 days.   Okay to refill Humira?

## 2023-05-13 ENCOUNTER — Ambulatory Visit: Payer: Medicare Other | Attending: Rheumatology | Admitting: Physician Assistant

## 2023-05-13 ENCOUNTER — Encounter: Payer: Self-pay | Admitting: Physician Assistant

## 2023-05-13 VITALS — BP 119/70 | HR 84 | Resp 14 | Ht 62.0 in | Wt 151.2 lb

## 2023-05-13 DIAGNOSIS — Z9181 History of falling: Secondary | ICD-10-CM

## 2023-05-13 DIAGNOSIS — M19041 Primary osteoarthritis, right hand: Secondary | ICD-10-CM

## 2023-05-13 DIAGNOSIS — M0579 Rheumatoid arthritis with rheumatoid factor of multiple sites without organ or systems involvement: Secondary | ICD-10-CM | POA: Diagnosis not present

## 2023-05-13 DIAGNOSIS — Z8669 Personal history of other diseases of the nervous system and sense organs: Secondary | ICD-10-CM

## 2023-05-13 DIAGNOSIS — Z96653 Presence of artificial knee joint, bilateral: Secondary | ICD-10-CM

## 2023-05-13 DIAGNOSIS — D75A Glucose-6-phosphate dehydrogenase (G6PD) deficiency without anemia: Secondary | ICD-10-CM

## 2023-05-13 DIAGNOSIS — M19042 Primary osteoarthritis, left hand: Secondary | ICD-10-CM

## 2023-05-13 DIAGNOSIS — E559 Vitamin D deficiency, unspecified: Secondary | ICD-10-CM

## 2023-05-13 DIAGNOSIS — Z79899 Other long term (current) drug therapy: Secondary | ICD-10-CM

## 2023-05-13 DIAGNOSIS — E785 Hyperlipidemia, unspecified: Secondary | ICD-10-CM

## 2023-05-13 DIAGNOSIS — Z8659 Personal history of other mental and behavioral disorders: Secondary | ICD-10-CM

## 2023-05-13 DIAGNOSIS — Z8679 Personal history of other diseases of the circulatory system: Secondary | ICD-10-CM

## 2023-05-13 DIAGNOSIS — Z8781 Personal history of (healed) traumatic fracture: Secondary | ICD-10-CM

## 2023-05-13 DIAGNOSIS — Z905 Acquired absence of kidney: Secondary | ICD-10-CM

## 2023-05-13 DIAGNOSIS — Z87891 Personal history of nicotine dependence: Secondary | ICD-10-CM

## 2023-05-13 NOTE — Patient Instructions (Signed)
Standing Labs We placed an order today for your standing lab work.   Please have your standing labs drawn in December and every 3 months   Please have your labs drawn 2 weeks prior to your appointment so that the provider can discuss your lab results at your appointment, if possible.  Please note that you may see your imaging and lab results in MyChart before we have reviewed them. We will contact you once all results are reviewed. Please allow our office up to 72 hours to thoroughly review all of the results before contacting the office for clarification of your results.  WALK-IN LAB HOURS  Monday through Thursday from 8:00 am -12:30 pm and 1:00 pm-5:00 pm and Friday from 8:00 am-12:00 pm.  Patients with office visits requiring labs will be seen before walk-in labs.  You may encounter longer than normal wait times. Please allow additional time. Wait times may be shorter on  Monday and Thursday afternoons.  We do not book appointments for walk-in labs. We appreciate your patience and understanding with our staff.   Labs are drawn by [facility]. Please bring your co-pay at the time of your lab draw.  You may receive a bill from [facility] for your lab work.  Please note if you are on Hydroxychloroquine and and an order has been placed for a Hydroxychloroquine level,  you will need to have it drawn 4 hours or more after your last dose.  If you wish to have your labs drawn at another location, please call the office 24 hours in advance so we can fax the orders.  The office is located at [address], [place], [place] [address]   If you have any questions regarding directions or hours of operation,  please call [phone].   As a reminder, please drink plenty of water prior to coming for your lab work. Thanks!

## 2023-05-14 ENCOUNTER — Other Ambulatory Visit (HOSPITAL_COMMUNITY): Payer: Self-pay

## 2023-05-14 ENCOUNTER — Telehealth: Payer: Self-pay | Admitting: Pharmacist

## 2023-05-14 DIAGNOSIS — M0579 Rheumatoid arthritis with rheumatoid factor of multiple sites without organ or systems involvement: Secondary | ICD-10-CM

## 2023-05-14 DIAGNOSIS — Z79899 Other long term (current) drug therapy: Secondary | ICD-10-CM

## 2023-05-14 NOTE — Telephone Encounter (Signed)
Patient completed her portion of Humira PAP renewal for 2025 at [facility] yesterday. Provider portion placed in Sherron Ales, PA-C's folder for signature  Submitted a Prior Authorization RENEWAL request to [employer] for HUMIRA via CoverMyMeds. Will update once we receive a response.  Key: [id number]   Chesley Mires, PharmD, MPH, BCPS, CPP Clinical Pharmacist (Rheumatology and Pulmonology)

## 2023-05-14 NOTE — Telephone Encounter (Signed)
Received notification from [facility] regarding a prior authorization for HUMIRA. Authorization has been APPROVED from 05/14/23 to 06/30/24. Approval letter sent to scan center.  Authorization # [id number] Phone # [phone]  Will attached to PAP renewal application

## 2023-05-15 NOTE — Telephone Encounter (Signed)
Received signed MD form from Sherron Ales, PA-C. Submitted Patient Assistance RENEWAL Application to [employer] for HUMIRA along with provider portion, patient portion, PA, medication list, insurance card copy and income documents. Will update patient when we receive a response.  Phone: [phone] Fax: [phone]

## 2023-05-21 NOTE — Telephone Encounter (Signed)
Received fax from [place] stating patient's application is missing information from patient. They have requested needed information directly from the patient. Once they receive documents, they will continue application evaluation. If they do not receive information within 2 weeks, they will close eligibility review  Chesley Mires, PharmD, MPH, BCPS, CPP Clinical Pharmacist (Rheumatology and Pulmonology)

## 2023-06-03 ENCOUNTER — Ambulatory Visit: Payer: Medicare Other | Admitting: Rheumatology

## 2023-07-08 NOTE — Telephone Encounter (Signed)
 Received fax from [employer] stating patient's Humira  PAP application has been placed on hold. Patient needs to apply for LIS through Social Security and receive determination. If approved, Humira  will become affordable through insurance. If denied, will need proof of denial to submit to [facility]  ATC patient to discuss and determine status of her Extra Help enrollment. Left VM regarding next steps and return call to clinic or [employer] for clarification if needed  Sherry Pennant, PharmD, MPH, BCPS, CPP Clinical Pharmacist (Rheumatology and Pulmonology)

## 2023-07-29 ENCOUNTER — Other Ambulatory Visit: Payer: Self-pay | Admitting: *Deleted

## 2023-07-29 ENCOUNTER — Other Ambulatory Visit (HOSPITAL_COMMUNITY): Payer: Self-pay

## 2023-07-29 DIAGNOSIS — Z79899 Other long term (current) drug therapy: Secondary | ICD-10-CM

## 2023-07-29 NOTE — Telephone Encounter (Signed)
Patient stopped by clinic for labs and wanted to discuss [employer] application. Provided her with LIS application steps. She states she will work on this today.  Advised we will need denial letter to submit to [facility] for reconsideration  Chesley Mires, PharmD, MPH, BCPS, CPP Clinical Pharmacist (Rheumatology and Pulmonology)

## 2023-07-29 NOTE — Progress Notes (Signed)
CBC WNL

## 2023-07-30 LAB — COMPLETE METABOLIC PANEL WITH GFR
AG Ratio: 1.2 (calc) (ref 1.0–2.5)
ALT: 13 U/L (ref 6–29)
AST: 21 U/L (ref 10–35)
Albumin: 3.7 g/dL (ref 3.6–5.1)
Alkaline phosphatase (APISO): 107 U/L (ref 37–153)
BUN: 9 mg/dL (ref 7–25)
CO2: 31 mmol/L (ref 20–32)
Calcium: 9.5 mg/dL (ref 8.6–10.4)
Chloride: 105 mmol/L (ref 98–110)
Creat: 0.95 mg/dL (ref 0.60–1.00)
Globulin: 3.2 g/dL (ref 1.9–3.7)
Glucose, Bld: 84 mg/dL (ref 65–99)
Potassium: 4 mmol/L (ref 3.5–5.3)
Sodium: 143 mmol/L (ref 135–146)
Total Bilirubin: 0.5 mg/dL (ref 0.2–1.2)
Total Protein: 6.9 g/dL (ref 6.1–8.1)
eGFR: 63 mL/min/{1.73_m2} (ref >=60)

## 2023-07-30 LAB — CBC WITH DIFFERENTIAL/PLATELET
Absolute Lymphocytes: 1971 {cells}/uL (ref 850–3900)
Absolute Monocytes: 610 {cells}/uL (ref 200–950)
Basophils Absolute: 38 {cells}/uL (ref 0–200)
Basophils Relative: 0.7 %
Eosinophils Absolute: 362 {cells}/uL (ref 15–500)
Eosinophils Relative: 6.7 %
HCT: 40 % (ref 35.0–45.0)
Hemoglobin: 12.8 g/dL (ref 11.7–15.5)
MCH: 28.6 pg (ref 27.0–33.0)
MCHC: 32 g/dL (ref 32.0–36.0)
MCV: 89.5 fL (ref 80.0–100.0)
MPV: 10.2 fL (ref 7.5–12.5)
Monocytes Relative: 11.3 %
Neutro Abs: 2419 {cells}/uL (ref 1500–7800)
Neutrophils Relative %: 44.8 %
Platelets: 322 10*3/uL (ref 140–400)
RBC: 4.47 10*6/uL (ref 3.80–5.10)
RDW: 11.7 % (ref 11.0–15.0)
Total Lymphocyte: 36.5 %
WBC: 5.4 10*3/uL (ref 3.8–10.8)

## 2023-07-30 NOTE — Progress Notes (Signed)
CMP WNL

## 2023-08-26 ENCOUNTER — Other Ambulatory Visit (HOSPITAL_COMMUNITY): Payer: Self-pay | Admitting: Obstetrics and Gynecology

## 2023-08-26 DIAGNOSIS — Z8249 Family history of ischemic heart disease and other diseases of the circulatory system: Secondary | ICD-10-CM

## 2023-10-10 NOTE — Progress Notes (Signed)
 Office Visit Note  Patient: Diana [name]             Date of Birth: 08-09-1950           MRN: [id number]             PCP: Carlye Child, MD Referring: Carlye Child, MD Visit Date: 10/23/2023 Occupation: @GUAROCC @  Subjective:  Medication management  History of Present Illness: Diana [name] is a 73 y.o. female with seropositive rheumatoid arthritis and osteoarthritis.  She returns today after her last visit in November 2024.  She denies any morning stiffness, joint pain or joint swelling.  She has not had any flares since the last visit.  She has been taking Humira  40 mg subcu every other week without any interruption.  She notices some thickening of bilateral thumb bases.  She states she had a patch of dry skin on her abdomen which was diagnosed as eczema by her PCP.    Activities of Daily Living:  Patient reports morning stiffness for 0 minute.   Patient Denies nocturnal pain.  Difficulty dressing/grooming: Denies Difficulty climbing stairs: Denies Difficulty getting out of chair: Denies Difficulty using hands for taps, buttons, cutlery, and/or writing: Denies  Review of Systems  Constitutional:  Negative for fatigue.  HENT:  Positive for mouth dryness. Negative for mouth sores.   Eyes:  Negative for dryness.  Respiratory:  Negative for shortness of breath.   Cardiovascular:  Negative for palpitations.  Gastrointestinal:  Negative for blood in stool, constipation and diarrhea.  Endocrine: Positive for increased urination.  Genitourinary:  Negative for involuntary urination.  Musculoskeletal:  Positive for joint pain and joint pain. Negative for gait problem, joint swelling, myalgias, muscle weakness, morning stiffness, muscle tenderness and myalgias.  Skin:  Positive for rash. Negative for color change, hair loss and sensitivity to sunlight.       eczema  Allergic/Immunologic: Negative for susceptible to infections.  Neurological:  Positive for headaches. Negative for dizziness.   Hematological:  Negative for swollen glands.  Psychiatric/Behavioral:  Positive for sleep disturbance. Negative for depressed mood. The patient is not nervous/anxious.     PMFS History:  Patient Active Problem List   Diagnosis Date Noted   H/O total knee replacement, left 08/18/2019   Osteoarthritis of left knee 06/08/2019   Unilateral primary osteoarthritis, left knee 05/25/2019   History of total right knee replacement 02/02/2019   Rheumatoid arthritis with positive rheumatoid factor  05/14/2016   High risk medication use 05/14/2016   Primary osteoarthritis of both hands 05/14/2016   G6PD deficiency 05/14/2016   S/p nephrectomy 05/14/2016   Essential hypertension 05/14/2016   Depression 05/14/2016   Sleep apnea 05/14/2016   Former smoker 05/14/2016   History of ankle fracture 05/14/2016   Acute medial meniscus tear of right knee 10/08/2011    Class: Acute   Osteoarthritis of right knee 10/08/2011    Class: Chronic    Past Medical History:  Diagnosis Date   Depression    Hyperlipidemia    Hypertension    Kidney disease    "my one kidney doesnt give me problems"    Positive PPD    works health dept   Rheumatoid arthritis(714.0)    Sleep apnea    uses cpap-mod-severe; i used to but then i got tired of it     History reviewed. No pertinent family history. Past Surgical History:  Procedure Laterality Date   ANKLE ARTHRODESIS  2010   left   BREAST LUMPECTOMY  1998   rt   BREAST SURGERY     rt breast bx   CERVICAL BIOPSY     KIDNEY SURGERY     KNEE ARTHROPLASTY     NEPHRECTOMY  2007   right   TOTAL KNEE ARTHROPLASTY Right 02/02/2019   Procedure: RIGHT TOTAL KNEE ARTHROPLASTY;  Surgeon: Shirlee Dotter, MD;  Location: [facility];  Service: Orthopedics;  Laterality: Right;   TOTAL KNEE ARTHROPLASTY Left 06/08/2019   Procedure: LEFT TOTAL KNEE ARTHROPLASTY;  Surgeon: Shirlee Dotter, MD;  Location: [facility];  Service: Orthopedics;  Laterality: Left;   TUBAL LIGATION      Social History   Social History Narrative   Not on file   Immunization History  Administered Date(s) Administered   Influenza, High Dose Seasonal PF 04/10/2018   PFIZER Comirnaty(Gray Top)Covid-19 Tri-Sucrose Vaccine 04/03/2020   PFIZER(Purple Top)SARS-COV-2 Vaccination 07/16/2019, 08/06/2019, 04/29/2020   Pfizer Covid-19 Vaccine Bivalent Booster 5yrs & up 04/01/2021   Pneumococcal Polysaccharide-23 04/10/2018     Objective: Vital Signs: BP 106/63 (BP Location: Left Arm, Patient Position: Sitting, Cuff Size: Normal)   Pulse 80   Resp 14   Ht 5\' 2"  (1.575 m)   Wt 146 lb (66.2 kg)   BMI 26.70 kg/m    Physical Exam Vitals and nursing note reviewed.  Constitutional:      Appearance: She is well-developed.  HENT:     Head: Normocephalic and atraumatic.  Eyes:     Conjunctiva/sclera: Conjunctivae normal.  Cardiovascular:     Rate and Rhythm: Normal rate and regular rhythm.     Heart sounds: Normal heart sounds.  Pulmonary:     Effort: Pulmonary effort is normal.     Breath sounds: Normal breath sounds.  Abdominal:     General: Bowel sounds are normal.     Palpations: Abdomen is soft.  Musculoskeletal:     Cervical back: Normal range of motion.  Lymphadenopathy:     Cervical: No cervical adenopathy.  Skin:    General: Skin is warm and dry.     Capillary Refill: Capillary refill takes less than 2 seconds.  Neurological:     Mental Status: She is alert and oriented to person, place, and time.  Psychiatric:        Behavior: Behavior normal.      Musculoskeletal Exam: Cervical, thoracic and lumbar spine with good range of motion.  Shoulder joints, elbow joints, wrist joints, MCPs PIPs and DIPs with good range of motion.  She had bilateral first MCP thickening with no synovitis.  Hip joints and knee joints in good range of motion without any warmth swelling or effusion.  Bilateral knee joints were replaced.  There was no tenderness over ankles or MTPs.  CDAI  Exam: CDAI Score: -- Patient Global: --; Provider Global: -- Swollen: --; Tender: -- Joint Exam 10/23/2023   No joint exam has been documented for this visit   There is currently no information documented on the homunculus. Go to the Rheumatology activity and complete the homunculus joint exam.  Investigation: No additional findings.  Imaging: No results found.  Recent Labs: Lab Results  Component Value Date   WBC 6.6 10/22/2023   HGB 13.3 10/22/2023   PLT 302 10/22/2023   NA 143 10/22/2023   K 4.0 10/22/2023   CL 105 10/22/2023   CO2 32 10/22/2023   GLUCOSE 88 10/22/2023   BUN 13 10/22/2023   CREATININE 0.89 10/22/2023   BILITOT 0.5 10/22/2023   ALKPHOS 99  06/07/2019   AST 22 10/22/2023   ALT 10 10/22/2023   PROT 7.0 10/22/2023   ALBUMIN 3.7 06/07/2019   CALCIUM 9.6 10/22/2023   GFRAA 79 09/11/2020   QFTBGOLD NEGATIVE 05/07/2017   QFTBGOLDPLUS NEGATIVE 03/19/2023    Speciality Comments: No specialty comments available.  Procedures:  No procedures performed Allergies: Other, Iodine , and Latex   Assessment / Plan:     Visit Diagnoses: Rheumatoid arthritis involving multiple sites with positive rheumatoid factor (HCC)-patient had no synovitis on the examination today.  She had bilateral first MCP thickening which is unchanged.  Patient denies any history of joint swelling.  She has not had any flares.  She has been taking Humira  now on a regular basis.  High risk medication use - Humira  40 mg sq injections every 14 days.  Labs from January 21, 2024 were reviewed.  Eosinophils are elevated most likely due to recent allergies.  Will continue to monitor labs.  TB Gold was negative on March 19, 2023.  She was advised to get repeat labs in 3 months and then every 3 months to monitor for drug toxicity.  TB Gold will be checked annually.  Information normalization was placed in the AVS.  She was advised to hold Humira  if she develops an infection and resume after the  infection resolves.  Annual skin examination to screen for skin cancer was advised.  Use of sunscreen and sun protection was advised.  Primary osteoarthritis of both hands-she has osteoarthritis and rheumatoid arthritis overlap without any active synovitis.  Status post total knee replacement, bilateral-she had good range of motion of bilateral knee joints without any warmth swelling or effusion.  She takes Tylenol  on apparent basis.  History of ankle fracture-left 2010-doing well.  G6PD deficiency  History of hypertension-blood pressure normal today.  Other medical problems are listed as follows:  History of nephrectomy  History of depression  Dyslipidemia  History of sleep apnea  Vitamin D  deficiency  Ex-smoker  Orders: No orders of the defined types were placed in this encounter.  No orders of the defined types were placed in this encounter.   Follow-Up Instructions: Return in about 5 months (around 03/24/2024) for Rheumatoid arthritis, Osteoarthritis.   Nicholas Bari, MD  Note - This record has been created using Animal nutritionist.  Chart creation errors have been sought, but may not always  have been located. Such creation errors do not reflect on  the standard of medical care.

## 2023-10-22 ENCOUNTER — Other Ambulatory Visit: Payer: Self-pay

## 2023-10-22 DIAGNOSIS — Z79899 Other long term (current) drug therapy: Secondary | ICD-10-CM

## 2023-10-22 DIAGNOSIS — E785 Hyperlipidemia, unspecified: Secondary | ICD-10-CM

## 2023-10-22 DIAGNOSIS — M0579 Rheumatoid arthritis with rheumatoid factor of multiple sites without organ or systems involvement: Secondary | ICD-10-CM

## 2023-10-22 NOTE — Progress Notes (Signed)
 Absolute eosinophils are elevated--could be related to allergies.  Rest of CBC within normal limits.

## 2023-10-22 NOTE — Progress Notes (Unsigned)
 Patient requested the lipid panel be added to her labs. After speaking with Carolynne Citron, lipid panel lab released.

## 2023-10-23 ENCOUNTER — Encounter: Payer: Self-pay | Admitting: Rheumatology

## 2023-10-23 ENCOUNTER — Ambulatory Visit: Payer: Medicare Other | Attending: Rheumatology | Admitting: Rheumatology

## 2023-10-23 VITALS — BP 106/63 | HR 80 | Resp 14 | Ht 62.0 in | Wt 146.0 lb

## 2023-10-23 DIAGNOSIS — Z96653 Presence of artificial knee joint, bilateral: Secondary | ICD-10-CM | POA: Diagnosis not present

## 2023-10-23 DIAGNOSIS — Z79899 Other long term (current) drug therapy: Secondary | ICD-10-CM

## 2023-10-23 DIAGNOSIS — Z905 Acquired absence of kidney: Secondary | ICD-10-CM

## 2023-10-23 DIAGNOSIS — M0579 Rheumatoid arthritis with rheumatoid factor of multiple sites without organ or systems involvement: Secondary | ICD-10-CM

## 2023-10-23 DIAGNOSIS — M19041 Primary osteoarthritis, right hand: Secondary | ICD-10-CM

## 2023-10-23 DIAGNOSIS — Z8781 Personal history of (healed) traumatic fracture: Secondary | ICD-10-CM

## 2023-10-23 DIAGNOSIS — Z8659 Personal history of other mental and behavioral disorders: Secondary | ICD-10-CM

## 2023-10-23 DIAGNOSIS — E559 Vitamin D deficiency, unspecified: Secondary | ICD-10-CM

## 2023-10-23 DIAGNOSIS — M19042 Primary osteoarthritis, left hand: Secondary | ICD-10-CM

## 2023-10-23 DIAGNOSIS — Z8669 Personal history of other diseases of the nervous system and sense organs: Secondary | ICD-10-CM

## 2023-10-23 DIAGNOSIS — Z8679 Personal history of other diseases of the circulatory system: Secondary | ICD-10-CM

## 2023-10-23 DIAGNOSIS — E785 Hyperlipidemia, unspecified: Secondary | ICD-10-CM

## 2023-10-23 DIAGNOSIS — Z87891 Personal history of nicotine dependence: Secondary | ICD-10-CM

## 2023-10-23 DIAGNOSIS — D75A Glucose-6-phosphate dehydrogenase (G6PD) deficiency without anemia: Secondary | ICD-10-CM

## 2023-10-23 LAB — LIPID PANEL
Cholesterol: 223 mg/dL — ABNORMAL HIGH (ref <200)
HDL: 42 mg/dL — ABNORMAL LOW (ref >=50)
LDL Cholesterol (Calc): 156 mg/dL — ABNORMAL HIGH
Non-HDL Cholesterol (Calc): 181 mg/dL — ABNORMAL HIGH (ref <130)
Total CHOL/HDL Ratio: 5.3 (calc) — ABNORMAL HIGH (ref <5.0)
Triglycerides: 124 mg/dL (ref <150)

## 2023-10-23 LAB — CBC WITH DIFFERENTIAL/PLATELET
Absolute Lymphocytes: 2105 {cells}/uL (ref 850–3900)
Absolute Monocytes: 594 {cells}/uL (ref 200–950)
Basophils Absolute: 53 {cells}/uL (ref 0–200)
Basophils Relative: 0.8 %
Eosinophils Absolute: 1089 {cells}/uL — ABNORMAL HIGH (ref 15–500)
Eosinophils Relative: 16.5 %
HCT: 41.2 % (ref 35.0–45.0)
Hemoglobin: 13.3 g/dL (ref 11.7–15.5)
MCH: 28.4 pg (ref 27.0–33.0)
MCHC: 32.3 g/dL (ref 32.0–36.0)
MCV: 87.8 fL (ref 80.0–100.0)
MPV: 10.3 fL (ref 7.5–12.5)
Monocytes Relative: 9 %
Neutro Abs: 2759 {cells}/uL (ref 1500–7800)
Neutrophils Relative %: 41.8 %
Platelets: 302 10*3/uL (ref 140–400)
RBC: 4.69 10*6/uL (ref 3.80–5.10)
RDW: 12.1 % (ref 11.0–15.0)
Total Lymphocyte: 31.9 %
WBC: 6.6 10*3/uL (ref 3.8–10.8)

## 2023-10-23 LAB — COMPLETE METABOLIC PANEL WITHOUT GFR
AG Ratio: 1.2 (calc) (ref 1.0–2.5)
ALT: 10 U/L (ref 6–29)
AST: 22 U/L (ref 10–35)
Albumin: 3.8 g/dL (ref 3.6–5.1)
Alkaline phosphatase (APISO): 102 U/L (ref 37–153)
BUN: 13 mg/dL (ref 7–25)
CO2: 32 mmol/L (ref 20–32)
Calcium: 9.6 mg/dL (ref 8.6–10.4)
Chloride: 105 mmol/L (ref 98–110)
Creat: 0.89 mg/dL (ref 0.60–1.00)
Globulin: 3.2 g/dL (ref 1.9–3.7)
Glucose, Bld: 88 mg/dL (ref 65–99)
Potassium: 4 mmol/L (ref 3.5–5.3)
Sodium: 143 mmol/L (ref 135–146)
Total Bilirubin: 0.5 mg/dL (ref 0.2–1.2)
Total Protein: 7 g/dL (ref 6.1–8.1)

## 2023-10-23 NOTE — Patient Instructions (Signed)
 Standing Labs We placed an order today for your standing lab work.   Please have your standing labs drawn in July and every 3 months  Please have your labs drawn 2 weeks prior to your appointment so that the provider can discuss your lab results at your appointment, if possible.  Please note that you may see your imaging and lab results in MyChart before we have reviewed them. We will contact you once all results are reviewed. Please allow our office up to 72 hours to thoroughly review all of the results before contacting the office for clarification of your results.  WALK-IN LAB HOURS  Monday through Thursday from 8:00 am -12:30 pm and 1:00 pm-5:00 pm and Friday from 8:00 am-12:00 pm.  Patients with office visits requiring labs will be seen before walk-in labs.  You may encounter longer than normal wait times. Please allow additional time. Wait times may be shorter on  Monday and Thursday afternoons.  We do not book appointments for walk-in labs. We appreciate your patience and understanding with our staff.   Labs are drawn by [facility]. Please bring your co-pay at the time of your lab draw.  You may receive a bill from [facility] for your lab work.  Please note if you are on Hydroxychloroquine and and an order has been placed for a Hydroxychloroquine level,  you will need to have it drawn 4 hours or more after your last dose.  If you wish to have your labs drawn at another location, please call the office 24 hours in advance so we can fax the orders.  The office is located at [address], [place], [place] [address]   If you have any questions regarding directions or hours of operation,  please call [phone].   As a reminder, please drink plenty of water prior to coming for your lab work. Thanks!   Vaccines You are taking a medication(s) that can suppress your immune system.  The following immunizations are recommended: Flu annually Covid-19  Td/Tdap (tetanus, diphtheria,  pertussis) every 10 years Pneumonia (Prevnar 15 then Pneumovax 23 at least 1 year apart.  Alternatively, can take Prevnar 20 without needing additional dose) Shingrix: 2 doses from 4 weeks to 6 months apart  Please check with your PCP to make sure you are up to date.   If you have signs or symptoms of an infection or start antibiotics: First, call your PCP for workup of your infection. Hold your medication through the infection, until you complete your antibiotics, and until symptoms resolve if you take the following: Injectable medication (Actemra, Benlysta, Cimzia, Cosentyx, Enbrel, Humira , Kevzara, Orencia, Remicade, Simponi, Stelara, Taltz, Tremfya) Methotrexate Leflunomide (Arava) Mycophenolate (Cellcept) Cloria Danger, Olumiant, or Rinvoq   Please get annual skin exam to screen for skin cancer while you are on Humira . Please use sun screen and sun protection.

## 2023-10-23 NOTE — Progress Notes (Signed)
 CMP WNL Total cholesterol is elevated-223--stable.  HDL is low-42.  LDL is elevated-156. Please forward results to PCP

## 2023-10-23 NOTE — Addendum Note (Signed)
 Addended by: Dee Farber on: 10/23/2023 04:46 PM   Modules accepted: Orders

## 2023-10-24 ENCOUNTER — Telehealth: Payer: Self-pay | Admitting: *Deleted

## 2023-10-24 NOTE — Telephone Encounter (Signed)
 Patient contacted the office about her Humira  refill. Patient states she has not been approved by [employer]. What are the next steps for patient and where would her refill need to go?

## 2023-11-04 NOTE — Telephone Encounter (Signed)
 Spoke with [employer] representative to discuss status of patient assistance application. Per representative, they have not yet received any documentation of Medicare Extra Help denial (SS LIS). They did receive a call from the patient on 07/29/2023 to discuss the status of her application. She was told to apply for Extra Help at that time.   Attempted to call patient to discuss if she has applied for Extra Help. Unable to reach her. LVM with clinic call back number.   [employer]  Phone: [phone] Fax: [phone]  Tolu Cornie Mccomber, PharmD [facility] PGY1

## 2023-11-04 NOTE — Telephone Encounter (Signed)
 Left VM for patient regarding LIS. Diana [name] states that they have not received any LIS determination from patient  [name], PharmD, MPH, BCPS, CPP Clinical Pharmacist (Rheumatology and Pulmonology)

## 2023-11-05 ENCOUNTER — Other Ambulatory Visit (HOSPITAL_COMMUNITY): Payer: Self-pay

## 2023-11-05 MED ORDER — HUMIRA (2 PEN) 40 MG/0.4ML ~~LOC~~ AJKT: 40.0000 mg | AUTO-INJECTOR | SUBCUTANEOUS | 0 refills | Status: DC

## 2023-11-05 NOTE — Telephone Encounter (Signed)
 Patient returned call. She states she was actually approved for Medicaid (card scanned into media tab).  Will need to process [facility] Medicare as primary + Medicaid as secondary. Rx sent to [facility] for onboarding.  Patient is using her last Humira  dose today  Jovin Fester, PharmD, MPH, BCPS, CPP Clinical Pharmacist (Rheumatology and Pulmonology)

## 2023-11-05 NOTE — Addendum Note (Signed)
 Addended by: Thais Fill on: 11/05/2023 10:08 AM   Modules accepted: Orders

## 2023-11-07 ENCOUNTER — Other Ambulatory Visit (HOSPITAL_COMMUNITY): Payer: Self-pay

## 2023-11-10 ENCOUNTER — Other Ambulatory Visit: Payer: Self-pay | Admitting: Pharmacist

## 2023-11-10 NOTE — Progress Notes (Signed)
 Patient previously receiving Humira  through pt assistance program but now has Medicaid. Will be affordable for patient. She will continue Humira  40mg  subcut every 14 days as monotherapy  Monitor CBC/CMP every 3 months and TB gold every year  [name], PharmD, MPH, BCPS, CPP Clinical Pharmacist (Rheumatology and Pulmonology)

## 2023-11-12 ENCOUNTER — Telehealth: Payer: Self-pay

## 2023-11-12 ENCOUNTER — Other Ambulatory Visit: Payer: Self-pay

## 2023-11-12 ENCOUNTER — Other Ambulatory Visit (HOSPITAL_COMMUNITY): Payer: Self-pay

## 2023-11-12 DIAGNOSIS — Z79899 Other long term (current) drug therapy: Secondary | ICD-10-CM

## 2023-11-12 DIAGNOSIS — M0579 Rheumatoid arthritis with rheumatoid factor of multiple sites without organ or systems involvement: Secondary | ICD-10-CM

## 2023-11-12 NOTE — Telephone Encounter (Signed)
 Test claim rejected due to Humira  supposedly being a plan exclusion through Medicaid. Will attempt to submit a PA to obtain coverage.  Submitted a Prior Authorization request to [place] [employer] for HUMIRA  via [employer]. Will update once we receive a response.  Confirmation#: [id number]

## 2023-11-19 ENCOUNTER — Other Ambulatory Visit (HOSPITAL_COMMUNITY): Payer: Self-pay

## 2023-11-19 NOTE — Telephone Encounter (Signed)
 Submitted Patient Assistance Application to [employer] for HUMIRA  along with provider portion, patient portion, PA, medication list, insurance card copy and income documents. Will update patient when we receive a response.  Phone: [phone] Fax: [phone]

## 2023-11-19 NOTE — Telephone Encounter (Signed)
 Received a fax regarding Prior Authorization from [place] [employer] for HUMIRA . Authorization has been DENIED because patient is enrolled into [facility], and thus any services not related to contraception (or the prevention of) are not covered.  Contacted pt and discussed, apparently one of the other issues she ran into during the renewal period was that she had temporarily moved in with a friend until she could get a new apartment of her own, and the [employer] representative that she was speaking to was requesting the financial information of the friend she was temporarily staying with (which should not have been a factor since they were technically not "contributing to the same household"). However, pt has since moved in to her new apartment and therefore should hopefully not have any further issues.  Will work on completing a new application with pt's new address and submit along with Medicaid denial letter.

## 2023-11-20 MED ORDER — HUMIRA (2 PEN) 40 MG/0.4ML ~~LOC~~ AJKT: 40.0000 mg | AUTO-INJECTOR | SUBCUTANEOUS | Status: DC

## 2023-11-20 NOTE — Progress Notes (Signed)
 Disenrolled from specialty pharmacy services. Patient's Medicaid is only family planning Medicaid. Will need to re-pursue patient assistance for Humira  through [employer]  [name], PharmD, MPH, BCPS, CPP Clinical Pharmacist (Rheumatology and Pulmonology)

## 2023-11-20 NOTE — Addendum Note (Signed)
 Addended by: Thais Fill on: 11/20/2023 10:00 AM   Modules accepted: Orders

## 2023-12-05 ENCOUNTER — Telehealth: Payer: Self-pay | Admitting: Rheumatology

## 2023-12-05 NOTE — Telephone Encounter (Signed)
 Humira  is only available as a subcutaneous injection

## 2023-12-05 NOTE — Telephone Encounter (Signed)
 I am OOO on Monday. I do not recall Ms. Bilotta dropping off any paperwork for Humira  application. She only scanned in copy of Medicaid card as far I know

## 2023-12-05 NOTE — Telephone Encounter (Signed)
 Pt called stating she needs someone to call her in pharmacy about her medication(humira ). Pt stated she is out of her medication now and would like a sample if possible.   Pt also wanted to ask Carolynne Citron if there is a way her medication(humira ) could be in a pill form instead of a shot. Pt would like a call back.

## 2023-12-05 NOTE — Telephone Encounter (Signed)
 Spoke with [employer] rep who states that they have received and are processing the pt's application, however they have attempted to contact the pt via email and text regarding a need for updated financial information. Upon further review of the submitted documents, it appears these reports were taken on October of 2024.   Called and spoke to patient, she states that she "left an updated copy of my bank statement with the front desk person" when she "had an appointment in May". I noted that I could see where she had an appointment in late April, however there were no messages/notes that any kind of documentation had been left to be provided to the pharmacy team, nor were there any documents scanned in to her chart. I informed her that I would try to ask around and see if anyone could locate these documents, but requested that she also try to obtain an additional copy and bring it to the clinic. She verbalized understanding and stated that she would try to bring a copy to the clinic on Monday when she comes to pick up the sample (unless we are able to track it down beforehand, in that case I will call to inform her that a copy is no longer needed).

## 2023-12-05 NOTE — Telephone Encounter (Signed)
 LMOM for patient to call office, sample labeled in fridge, message sent to pharmacy to see if Humira  has been approved - May Rx was no print.

## 2023-12-05 NOTE — Telephone Encounter (Signed)
 Spoke with [employer] rep who states application processing has stalled due to a need for updated income docs. Spoke with pt who claims that she had dropped off newer bank statements "with the lady at the front desk" during her last office visit, however we never received the updated paperwork (we only have the documentation from 04/2023).  Informed pt that I would ask around and see if anyone could locate this paperwork, but otherwise she will try to obtain a copy on Monday and bring it to the clinic when she comes to pick up her sample.  FYI--I also left a more detailed note about this interaction in our ongoing telephone encounter from 11/12/23, please reference for additional information if needed.

## 2023-12-08 ENCOUNTER — Telehealth: Payer: Self-pay | Admitting: *Deleted

## 2023-12-08 NOTE — Telephone Encounter (Signed)
 Patient dropped off paper work and got Humira  sample.

## 2023-12-08 NOTE — Telephone Encounter (Signed)
 Medication Samples have been provided to the patient.  Drug name: Humira  Strength: 40 mg/0.4 mLs Qty: 1 box LOT: [id number]  Exp.Date: 10/2024  Dosing instructions: Inject 0.4 mls (40 mg total) into the skin every 14 days.

## 2023-12-09 NOTE — Telephone Encounter (Signed)
 Income documents faxed to [employer]

## 2023-12-11 NOTE — Telephone Encounter (Signed)
 Received fax from [employer] stating patient assistance application for Humira  is again on hold because she must apply for LIS and receive determination. If approved, medication will become affordable. If denied, will need denial letter to submit to [employer] for reconsideration for PAP  Spoke with patient - she expressed confusion and frustration with process. Advised that with Medicare changes this year, unfortunately many patients were asked to go through these steps and that this is not in our control. Patient advised that we will not be able to provide further Humira  samples. She will call today to get this letter  [name], PharmD, MPH, BCPS, CPP Clinical Pharmacist (Rheumatology and Pulmonology)

## 2024-01-01 ENCOUNTER — Telehealth: Payer: Self-pay | Admitting: *Deleted

## 2024-01-01 NOTE — Telephone Encounter (Signed)
 Patient contacted the office and requested a call back. Patient states she has been on Humira . Patient states she has been trying to get approval for the Humira  through [employer]. Patient states she has spoken with [facility] and they stated she could have a letter of necessity sent explaining why she needs the Humira . Patient did not have any information as to who's attention it needed to go to or a fax number. Patient is to call  back with this information.

## 2024-01-22 NOTE — Telephone Encounter (Signed)
 Received pre-decisional notice for Extra Help/LIS denial. Faxed to [employer]. Will need final determination to send to [facility]  Phone: [phone] Fax: [phone]  Sherry Pennant, PharmD, MPH, BCPS, CPP Clinical Pharmacist (Rheumatology and Pulmonology)

## 2024-01-28 NOTE — Telephone Encounter (Signed)
 Called [employer] regarding PAP status. In need of Extra Help/LIS official denial letter. Called patient regarding this matter. Unable to reach. Left a voicemail. Advised patient to send or fax [employer] the official denial letter once she has received it. Encouraged patient to reach out to us  with any further questions regarding this process.   Deleta Colt PharmD Candidate [phone]  [facility]

## 2024-02-09 NOTE — Progress Notes (Signed)
 Office Visit Note  Patient: Diana [name]             Date of Birth: 12/08/1950           MRN: [id number]             PCP: Cesario Mutton, MD Referring: Cesario Mutton, MD Visit Date: 02/11/2024 Occupation: @GUAROCC @  Subjective:  Pain and swelling in multiple joints  History of Present Illness: Diana [name] is a 73 y.o. female with seropositive rheumatoid arthritis and osteoarthritis.  She returns today after her last visit in April 2025.  She states she has been off Humira  for 2 months now.  She has been having increased pain and swelling in multiple joints.  She complains of discomfort in her shoulders, hands, knees, ankles and her hips.  She has been noticing swelling in her both hands.  She is having difficulty raising her arms.    Activities of Daily Living:  Patient reports morning stiffness for all day.  Patient Reports nocturnal pain.  Difficulty dressing/grooming: Denies Difficulty climbing stairs: Denies Difficulty getting out of chair: Denies Difficulty using hands for taps, buttons, cutlery, and/or writing: Denies  Review of Systems  Constitutional:  Negative for fatigue.  HENT:  Negative for mouth sores and mouth dryness.   Eyes:  Negative for dryness.  Respiratory:  Negative for shortness of breath.   Cardiovascular:  Negative for chest pain and palpitations.  Gastrointestinal:  Negative for blood in stool, constipation and diarrhea.  Endocrine: Negative for increased urination.  Genitourinary:  Negative for involuntary urination.  Musculoskeletal:  Positive for joint pain, gait problem, joint pain, joint swelling and morning stiffness. Negative for myalgias, muscle weakness, muscle tenderness and myalgias.  Skin:  Positive for hair loss. Negative for color change, rash and sensitivity to sunlight.  Allergic/Immunologic: Negative for susceptible to infections.  Neurological:  Positive for numbness. Negative for dizziness and headaches.  Hematological:  Negative for  swollen glands.  Psychiatric/Behavioral:  Negative for depressed mood and sleep disturbance. The patient is nervous/anxious.     PMFS History:  Patient Active Problem List   Diagnosis Date Noted   H/O total knee replacement, left 08/18/2019   Osteoarthritis of left knee 06/08/2019   Unilateral primary osteoarthritis, left knee 05/25/2019   History of total right knee replacement 02/02/2019   Rheumatoid arthritis with positive rheumatoid factor  05/14/2016   High risk medication use 05/14/2016   Primary osteoarthritis of both hands 05/14/2016   G6PD deficiency 05/14/2016   S/p nephrectomy 05/14/2016   Essential hypertension 05/14/2016   Depression 05/14/2016   Sleep apnea 05/14/2016   Former smoker 05/14/2016   History of ankle fracture 05/14/2016   Acute medial meniscus tear of right knee 10/08/2011    Class: Acute   Osteoarthritis of right knee 10/08/2011    Class: Chronic    Past Medical History:  Diagnosis Date   Depression    Hyperlipidemia    Hypertension    Kidney disease    my one kidney doesnt give me problems    Positive PPD    works health dept   Rheumatoid arthritis(714.0)    Sleep apnea    uses cpap-mod-severe; i used to but then i got tired of it     History reviewed. No pertinent family history. Past Surgical History:  Procedure Laterality Date   ANKLE ARTHRODESIS  2010   left   BREAST LUMPECTOMY  1998   rt   BREAST SURGERY  rt breast bx   CERVICAL BIOPSY     KIDNEY SURGERY     KNEE ARTHROPLASTY     NEPHRECTOMY  2007   right   TOTAL KNEE ARTHROPLASTY Right 02/02/2019   Procedure: RIGHT TOTAL KNEE ARTHROPLASTY;  Surgeon: Anderson Maude ORN, MD;  Location: [facility];  Service: Orthopedics;  Laterality: Right;   TOTAL KNEE ARTHROPLASTY Left 06/08/2019   Procedure: LEFT TOTAL KNEE ARTHROPLASTY;  Surgeon: Anderson Maude ORN, MD;  Location: [facility];  Service: Orthopedics;  Laterality: Left;   TUBAL LIGATION     Social History   Social History  Narrative   Not on file   Immunization History  Administered Date(s) Administered   Influenza, High Dose Seasonal PF 04/10/2018   PFIZER Comirnaty(Gray Top)Covid-19 Tri-Sucrose Vaccine 04/03/2020   PFIZER(Purple Top)SARS-COV-2 Vaccination 07/16/2019, 08/06/2019, 04/29/2020   Pfizer Covid-19 Vaccine Bivalent Booster 62yrs & up 04/01/2021   Pneumococcal Polysaccharide-23 04/10/2018     Objective: Vital Signs: BP 127/74 (BP Location: Left Arm, Patient Position: Sitting, Cuff Size: Normal)   Pulse 69   Resp 13   Ht 5' 1 (1.549 m)   Wt 140 lb 9.6 oz (63.8 kg)   BMI 26.57 kg/m    Physical Exam Vitals and nursing note reviewed.  Constitutional:      Appearance: She is well-developed.  HENT:     Head: Normocephalic and atraumatic.  Eyes:     Conjunctiva/sclera: Conjunctivae normal.  Cardiovascular:     Rate and Rhythm: Normal rate and regular rhythm.     Heart sounds: Normal heart sounds.  Pulmonary:     Effort: Pulmonary effort is normal.     Breath sounds: Normal breath sounds.  Abdominal:     General: Bowel sounds are normal.     Palpations: Abdomen is soft.  Musculoskeletal:     Cervical back: Normal range of motion.  Lymphadenopathy:     Cervical: No cervical adenopathy.  Skin:    General: Skin is warm and dry.     Capillary Refill: Capillary refill takes less than 2 seconds.  Neurological:     Mental Status: She is alert and oriented to person, place, and time.  Psychiatric:        Behavior: Behavior normal.      Musculoskeletal Exam: Cervical, thoracic and lumbar spine with good range of motion.  She had painful range of motion of bilateral shoulders.  She had synovitis over some of the MCPs and PIP joints as described below.  She had painful range of motion of hip joints.  There was warmth on palpation of bilateral knee joints.  There was swelling over bilateral ankles.  There was some tenderness over the right second MTP without synovitis.  CDAI Exam: CDAI  Score: 22  Patient Global: 50 / 100; Provider Global: 50 / 100 Swollen: 6 ; Tender: 9  Joint Exam 02/11/2024      Right  Left  Glenohumeral   Tender   Tender  MCP 1  Swollen Tender  Swollen Tender  MCP 2  Swollen Tender     Knee  Swollen Tender  Swollen Tender  Ankle  Swollen Tender     MTP 2   Tender        Investigation: No additional findings.  Imaging: No results found.  Recent Labs: Lab Results  Component Value Date   WBC 6.6 10/22/2023   HGB 13.3 10/22/2023   PLT 302 10/22/2023   NA 143 10/22/2023   K 4.0 10/22/2023  CL 105 10/22/2023   CO2 32 10/22/2023   GLUCOSE 88 10/22/2023   BUN 13 10/22/2023   CREATININE 0.89 10/22/2023   BILITOT 0.5 10/22/2023   ALKPHOS 99 06/07/2019   AST 22 10/22/2023   ALT 10 10/22/2023   PROT 7.0 10/22/2023   ALBUMIN 3.7 06/07/2019   CALCIUM 9.6 10/22/2023   GFRAA 79 09/11/2020   QFTBGOLD NEGATIVE 05/07/2017   QFTBGOLDPLUS NEGATIVE 03/19/2023    Speciality Comments: No specialty comments available.  Procedures:  No procedures performed Allergies: Other, Iodine , and Latex   Assessment / Plan:     Visit Diagnoses: Rheumatoid arthritis involving multiple sites with positive rheumatoid factor (HCC)-patient is having a flare of rheumatoid arthritis with pain and swelling in multiple joints.  She states she has been off Humira  for the last 2 months.  She had difficulty filling the papers that she did not have a definite location where she was living.  I offered prednisone  which she declined.  She cannot take NSAIDs because of low GFR.  Patient had a detailed conversation with the pharmacist.  She will await Humira  to be approved and restart Humira  once she will receive it.  High risk medication use - Humira  40 mg sq injections every 14 days.  She discontinued about 2 months ago.  January 22, 2024 CBC normal, CMP normal except GFR 56.  TB Gold was negative on March 19, 2023.  She was advised to get repeat labs in October including TB  Gold.  Information reimmunization was placed in the AVS.  She was advised to hold Humira  if she develops an infection resume after the infection resolves.  Annual skin examination to screen for skin cancer was advised.  Use of sunscreen and sun protection was discussed.  Primary osteoarthritis of both hands-she had bilateral PIP and DIP thickening.  She also had synovitis over MCPs and PIPs as described above.  Status post total knee replacement, bilateral-she had warmth and tenderness with range of motion of her bilateral knee joints.  History of ankle fracture-left 2010.  She had warmth on palpation of bilateral ankles.  G6PD deficiency  History of hypertension-blood pressure was normal at 127/74 today.  History of depression  History of nephrectomy  Dyslipidemia  Vitamin D  deficiency-January 22, 2024 vitamin D  61.5  History of sleep apnea  Ex-smoker  Orders: No orders of the defined types were placed in this encounter.  No orders of the defined types were placed in this encounter.   Follow-Up Instructions: Return in about 5 months (around 07/13/2024) for Rheumatoid arthritis.   Maya Nash, MD  Note - This record has been created using Animal nutritionist.  Chart creation errors have been sought, but may not always  have been located. Such creation errors do not reflect on  the standard of medical care.

## 2024-02-11 ENCOUNTER — Ambulatory Visit: Attending: Rheumatology | Admitting: Rheumatology

## 2024-02-11 ENCOUNTER — Other Ambulatory Visit (HOSPITAL_COMMUNITY): Payer: Self-pay

## 2024-02-11 ENCOUNTER — Encounter: Payer: Self-pay | Admitting: Rheumatology

## 2024-02-11 VITALS — BP 127/74 | HR 69 | Resp 13 | Ht 61.0 in | Wt 140.6 lb

## 2024-02-11 DIAGNOSIS — Z96653 Presence of artificial knee joint, bilateral: Secondary | ICD-10-CM | POA: Diagnosis not present

## 2024-02-11 DIAGNOSIS — E559 Vitamin D deficiency, unspecified: Secondary | ICD-10-CM

## 2024-02-11 DIAGNOSIS — Z8679 Personal history of other diseases of the circulatory system: Secondary | ICD-10-CM

## 2024-02-11 DIAGNOSIS — Z87891 Personal history of nicotine dependence: Secondary | ICD-10-CM

## 2024-02-11 DIAGNOSIS — M19042 Primary osteoarthritis, left hand: Secondary | ICD-10-CM

## 2024-02-11 DIAGNOSIS — Z79899 Other long term (current) drug therapy: Secondary | ICD-10-CM | POA: Diagnosis not present

## 2024-02-11 DIAGNOSIS — Z905 Acquired absence of kidney: Secondary | ICD-10-CM

## 2024-02-11 DIAGNOSIS — M19041 Primary osteoarthritis, right hand: Secondary | ICD-10-CM

## 2024-02-11 DIAGNOSIS — Z8781 Personal history of (healed) traumatic fracture: Secondary | ICD-10-CM

## 2024-02-11 DIAGNOSIS — Z8659 Personal history of other mental and behavioral disorders: Secondary | ICD-10-CM

## 2024-02-11 DIAGNOSIS — Z8669 Personal history of other diseases of the nervous system and sense organs: Secondary | ICD-10-CM

## 2024-02-11 DIAGNOSIS — D75A Glucose-6-phosphate dehydrogenase (G6PD) deficiency without anemia: Secondary | ICD-10-CM

## 2024-02-11 DIAGNOSIS — M0579 Rheumatoid arthritis with rheumatoid factor of multiple sites without organ or systems involvement: Secondary | ICD-10-CM | POA: Diagnosis not present

## 2024-02-11 DIAGNOSIS — E785 Hyperlipidemia, unspecified: Secondary | ICD-10-CM

## 2024-02-11 NOTE — Telephone Encounter (Signed)
 Patient at [facility] today - reports she has gone to [facility] Department and requested copy of final decision. Requested she call SS department for tracking number or request they email denial . Provided her with my email address  Sherry Pennant, PharmD, MPH, BCPS, CPP Clinical Pharmacist (Rheumatology and Pulmonology)

## 2024-02-11 NOTE — Patient Instructions (Addendum)
 Standing Labs We placed an order today for your standing lab work.   Please have your standing labs drawn in October and every 3 months  Please have your labs drawn 2 weeks prior to your appointment so that the provider can discuss your lab results at your appointment, if possible.  Please note that you may see your imaging and lab results in MyChart before we have reviewed them. We will contact you once all results are reviewed. Please allow our office up to 72 hours to thoroughly review all of the results before contacting the office for clarification of your results.  WALK-IN LAB HOURS  Monday through Thursday from 8:00 am -12:30 pm and 1:00 pm-4:30 pm and Friday from 8:00 am-12:00 pm.  Patients with office visits requiring labs will be seen before walk-in labs.  You may encounter longer than normal wait times. Please allow additional time. Wait times may be shorter on  Monday and Thursday afternoons.  We do not book appointments for walk-in labs. We appreciate your patience and understanding with our staff.   Labs are drawn by [facility]. Please bring your co-pay at the time of your lab draw.  You may receive a bill from [facility] for your lab work.  Please note if you are on Hydroxychloroquine and and an order has been placed for a Hydroxychloroquine level,  you will need to have it drawn 4 hours or more after your last dose.  If you wish to have your labs drawn at another location, please call the office 24 hours in advance so we can fax the orders.  The office is located at [address], [place], [place] [address]   If you have any questions regarding directions or hours of operation,  please call [phone].   As a reminder, please drink plenty of water prior to coming for your lab work. Thanks!   Vaccines You are taking a medication(s) that can suppress your immune system.  The following immunizations are recommended: Flu annually Covid-19  Td/Tdap (tetanus,  diphtheria, pertussis) every 10 years Pneumonia (Prevnar 15 then Pneumovax 23 at least 1 year apart.  Alternatively, can take Prevnar 20 without needing additional dose) Shingrix: 2 doses from 4 weeks to 6 months apart  Please check with your PCP to make sure you are up to date.   .If you have signs or symptoms of an infection or start antibiotics: First, call your PCP for workup of your infection. Hold your medication through the infection, until you complete your antibiotics, and until symptoms resolve if you take the following: Injectable medication (Actemra, Benlysta, Cimzia, Cosentyx, Enbrel, Humira , Kevzara, Orencia, Remicade, Simponi, Stelara, Taltz, Tremfya) Methotrexate Leflunomide (Arava) Mycophenolate (Cellcept) Earma, Olumiant, or Rinvoq  Please get an annual skin examination to screen for skin cancer.  Use of sunscreen and sun protection was discussed.

## 2024-02-16 NOTE — Telephone Encounter (Signed)
 Received finalized LIS denial letter. Faxed to [place] [facility]  Phone: [phone] Fax: [phone]  Sherry Pennant, PharmD, MPH, BCPS, CPP Clinical Pharmacist (Rheumatology and Pulmonology)

## 2024-02-17 MED ORDER — HUMIRA (2 PEN) 40 MG/0.4ML ~~LOC~~ AJKT: 40.0000 mg | AUTO-INJECTOR | SUBCUTANEOUS | 0 refills | Status: DC

## 2024-02-17 NOTE — Telephone Encounter (Signed)
 Received a fax from [employer] regarding an approval for HUMIRA  patient assistance from 02/16/2024 to 06/30/2025. Approval letter sent to [facility] center.  Phone: [phone] Fax: [phone]  Spoke with patient and advised of approval. She was ecstatic. Expressed gratitude. Will call [employer] today to schedule shipment  Sherry Pennant, PharmD, MPH, BCPS, CPP Clinical Pharmacist (Rheumatology and Pulmonology)

## 2024-03-09 NOTE — Progress Notes (Unsigned)
 Office Visit Note  Patient: Diana [name]             Date of Birth: October 19, 1950           MRN: [id number]             PCP: Cesario Mutton, MD Referring: Cesario Mutton, MD Visit Date: 03/23/2024 Occupation: @GUAROCC @  Subjective:  Medication monitoring   History of Present Illness: Diana [name] is a 73 y.o. female with history of seropositive rheumatoid arthritis.  Patient remains on  Humira  40 mg sq injections every 14 days.  Patient reports that she has had a total of 3 doses of Humira  since resuming therapy.  Patient states that her symptoms are gradually improving since resuming Humira .  She continues to have some residual soreness as well as inflammation in the right thumb.  She denies any nocturnal pain or morning stiffness.  She has been taking extra strength Tylenol  the morning in the evenings to help alleviate her symptoms. She denies any recent or recurrent infections.     Activities of Daily Living:  Patient reports morning stiffness for 0 minute.   Patient Denies nocturnal pain.  Difficulty dressing/grooming: Denies Difficulty climbing stairs: Denies Difficulty getting out of chair: Denies Difficulty using hands for taps, buttons, cutlery, and/or writing: Denies  Review of Systems  Constitutional:  Negative for fatigue.  HENT:  Negative for mouth sores and mouth dryness.   Eyes:  Positive for dryness.  Respiratory:  Negative for shortness of breath.   Cardiovascular:  Negative for chest pain and palpitations.  Gastrointestinal:  Negative for blood in stool, constipation and diarrhea.  Endocrine: Positive for increased urination.  Genitourinary:  Negative for involuntary urination.  Musculoskeletal:  Positive for joint pain, gait problem, joint pain, joint swelling and muscle tenderness. Negative for myalgias, muscle weakness, morning stiffness and myalgias.  Skin:  Negative for color change, rash, hair loss and sensitivity to sunlight.  Allergic/Immunologic: Negative for  susceptible to infections.  Neurological:  Negative for dizziness and headaches.  Hematological:  Negative for swollen glands.  Psychiatric/Behavioral:  Negative for depressed mood and sleep disturbance. The patient is not nervous/anxious.     PMFS History:  Patient Active Problem List   Diagnosis Date Noted   H/O total knee replacement, left 08/18/2019   Osteoarthritis of left knee 06/08/2019   Unilateral primary osteoarthritis, left knee 05/25/2019   History of total right knee replacement 02/02/2019   Rheumatoid arthritis with positive rheumatoid factor  05/14/2016   High risk medication use 05/14/2016   Primary osteoarthritis of both hands 05/14/2016   G6PD deficiency 05/14/2016   S/p nephrectomy 05/14/2016   Essential hypertension 05/14/2016   Depression 05/14/2016   Sleep apnea 05/14/2016   Former smoker 05/14/2016   History of ankle fracture 05/14/2016   Acute medial meniscus tear of right knee 10/08/2011    Class: Acute   Osteoarthritis of right knee 10/08/2011    Class: Chronic    Past Medical History:  Diagnosis Date   Depression    Hyperlipidemia    Hypertension    Kidney disease    my one kidney doesnt give me problems    Positive PPD    works health dept   Rheumatoid arthritis(714.0)    Sleep apnea    uses cpap-mod-severe; i used to but then i got tired of it     History reviewed. No pertinent family history. Past Surgical History:  Procedure Laterality Date   ANKLE ARTHRODESIS  2010  left   BREAST LUMPECTOMY  1998   rt   BREAST SURGERY     rt breast bx   CERVICAL BIOPSY     KIDNEY SURGERY     KNEE ARTHROPLASTY     NEPHRECTOMY  2007   right   TOTAL KNEE ARTHROPLASTY Right 02/02/2019   Procedure: RIGHT TOTAL KNEE ARTHROPLASTY;  Surgeon: Anderson Maude ORN, MD;  Location: [facility];  Service: Orthopedics;  Laterality: Right;   TOTAL KNEE ARTHROPLASTY Left 06/08/2019   Procedure: LEFT TOTAL KNEE ARTHROPLASTY;  Surgeon: Anderson Maude ORN, MD;   Location: [facility];  Service: Orthopedics;  Laterality: Left;   TUBAL LIGATION     Social History   Social History Narrative   Not on file   Immunization History  Administered Date(s) Administered   INFLUENZA, HIGH DOSE SEASONAL PF 04/10/2018   PFIZER Comirnaty(Gray Top)Covid-19 Tri-Sucrose Vaccine 04/03/2020   PFIZER(Purple Top)SARS-COV-2 Vaccination 07/16/2019, 08/06/2019, 04/29/2020   Pfizer Covid-19 Vaccine Bivalent Booster 84yrs & up 04/01/2021   Pneumococcal Polysaccharide-23 04/10/2018     Objective: Vital Signs: BP 105/66   Pulse 81   Temp 97.6 F (36.4 C)   Resp 16   Ht 5' 1 (1.549 m)   Wt 141 lb (64 kg)   BMI 26.64 kg/m    Physical Exam Vitals and nursing note reviewed.  Constitutional:      Appearance: She is well-developed.  HENT:     Head: Normocephalic and atraumatic.  Eyes:     Conjunctiva/sclera: Conjunctivae normal.  Cardiovascular:     Rate and Rhythm: Normal rate and regular rhythm.     Heart sounds: Normal heart sounds.  Pulmonary:     Effort: Pulmonary effort is normal.     Breath sounds: Normal breath sounds.  Abdominal:     General: Bowel sounds are normal.     Palpations: Abdomen is soft.  Musculoskeletal:     Cervical back: Normal range of motion.  Lymphadenopathy:     Cervical: No cervical adenopathy.  Skin:    General: Skin is warm and dry.     Capillary Refill: Capillary refill takes less than 2 seconds.  Neurological:     Mental Status: She is alert and oriented to person, place, and time.  Psychiatric:        Behavior: Behavior normal.      Musculoskeletal Exam: C-spine, thoracic spine, lumbar spine have good range of motion.  No midline spinal tenderness.  No SI joint tenderness.  Shoulder joints have good range of motion with no discomfort.  Elbow joints have good range of motion no tenderness along the joint line.  Tenderness and synovitis of the right first MCP joint and the right second PIP joint.  Complete fist formation  bilaterally.  Hip joints have good range of motion with no groin pain.  Both knee replacements have good range of motion no warmth or effusion.  Ankle joints have good range of motion no tenderness or joint swelling.  No evidence of Achilles tendinitis or plantar fasciitis.   CDAI Exam: CDAI Score: -- Patient Global: --; Provider Global: -- Swollen: 2 ; Tender: 2  Joint Exam 03/23/2024      Right  Left  MCP 1  Swollen Tender     PIP 2 (finger)  Swollen Tender        Investigation: No additional findings.  Imaging: No results found.  Recent Labs: Lab Results  Component Value Date   WBC 6.6 10/22/2023   HGB 13.3 10/22/2023  PLT 302 10/22/2023   NA 143 10/22/2023   K 4.0 10/22/2023   CL 105 10/22/2023   CO2 32 10/22/2023   GLUCOSE 88 10/22/2023   BUN 13 10/22/2023   CREATININE 0.89 10/22/2023   BILITOT 0.5 10/22/2023   ALKPHOS 99 06/07/2019   AST 22 10/22/2023   ALT 10 10/22/2023   PROT 7.0 10/22/2023   ALBUMIN 3.7 06/07/2019   CALCIUM 9.6 10/22/2023   GFRAA 79 09/11/2020   QFTBGOLD NEGATIVE 05/07/2017   QFTBGOLDPLUS NEGATIVE 03/19/2023    Speciality Comments: No specialty comments available.  Procedures:  No procedures performed Allergies: Iodine , Other, and Latex   Assessment / Plan:     Visit Diagnoses: Rheumatoid arthritis involving multiple sites with positive rheumatoid factor [facility]): Patient has started to notice an improvement in her symptoms since resuming Humira .  Thursday she received the third dose of Humira  since previously having a gap for about 2 months due to issues with insurance coverage. She is been taking Extra strength Tylenol  in the morning and in the evening to help alleviate her discomfort.  She continues to have some tenderness and inflammation in the right first MCP and the right second PIP joint. Patient is willing to give the current treatment regimen more time to take full effect.  She will notify us  if the symptoms persist or worsen.   She will follow-up in the office in 5 months or sooner if needed.  High risk medication use - Humira  40 mg sq injections every 14 days.  CBC and CMP updated on 01/22/24. Her next labb work will be due in October and every 3 months.  Lipid panel updated on 10/22/23.   TB gold negative on 03/19/23  No recent or recurrent infections.  Discussed the importance of holding humira  if she develops signs or symptoms of an infection and to resume once the infection has completely cleared.   - Plan: QuantiFERON-TB Gold Plus  Screening for tuberculosis -Future order for TB gold placed today.  Plan: QuantiFERON-TB Gold Plus  Primary osteoarthritis of both hands: PIP and DIP thickening consistent with osteoarthritis of both hands.  Inflammation noted in the right first MCP and the right second PIP joint.  She has been taking Extra strength Tylenol  for symptomatic relief.  Status post total knee replacement, bilateral: Doing well.  No warmth or effusion noted.  Other medical conditions are listed as follows:  History of ankle fracture-left 2010  G6PD deficiency  History of hypertension: Blood pressure was 105/66 today in the office.  History of depression  History of nephrectomy  Dyslipidemia  Vitamin D  deficiency  History of sleep apnea  Ex-smoker    Orders: Orders Placed This Encounter  Procedures   QuantiFERON-TB Gold Plus   No orders of the defined types were placed in this encounter.   Follow-Up Instructions: Return in about 5 months (around 08/23/2024).   Waddell CHRISTELLA Craze, PA-C  Note - This record has been created using Dragon software.  Chart creation errors have been sought, but may not always  have been located. Such creation errors do not reflect on  the standard of medical care.

## 2024-03-23 ENCOUNTER — Ambulatory Visit: Attending: Physician Assistant | Admitting: Physician Assistant

## 2024-03-23 ENCOUNTER — Encounter: Payer: Self-pay | Admitting: Physician Assistant

## 2024-03-23 VITALS — BP 105/66 | HR 81 | Temp 97.6°F | Resp 16 | Ht 61.0 in | Wt 141.0 lb

## 2024-03-23 DIAGNOSIS — D75A Glucose-6-phosphate dehydrogenase (G6PD) deficiency without anemia: Secondary | ICD-10-CM

## 2024-03-23 DIAGNOSIS — M19041 Primary osteoarthritis, right hand: Secondary | ICD-10-CM | POA: Diagnosis not present

## 2024-03-23 DIAGNOSIS — Z96653 Presence of artificial knee joint, bilateral: Secondary | ICD-10-CM

## 2024-03-23 DIAGNOSIS — M0579 Rheumatoid arthritis with rheumatoid factor of multiple sites without organ or systems involvement: Secondary | ICD-10-CM | POA: Diagnosis not present

## 2024-03-23 DIAGNOSIS — Z8659 Personal history of other mental and behavioral disorders: Secondary | ICD-10-CM

## 2024-03-23 DIAGNOSIS — M19042 Primary osteoarthritis, left hand: Secondary | ICD-10-CM

## 2024-03-23 DIAGNOSIS — E559 Vitamin D deficiency, unspecified: Secondary | ICD-10-CM

## 2024-03-23 DIAGNOSIS — Z8781 Personal history of (healed) traumatic fracture: Secondary | ICD-10-CM

## 2024-03-23 DIAGNOSIS — Z87891 Personal history of nicotine dependence: Secondary | ICD-10-CM

## 2024-03-23 DIAGNOSIS — E785 Hyperlipidemia, unspecified: Secondary | ICD-10-CM

## 2024-03-23 DIAGNOSIS — Z8679 Personal history of other diseases of the circulatory system: Secondary | ICD-10-CM

## 2024-03-23 DIAGNOSIS — Z79899 Other long term (current) drug therapy: Secondary | ICD-10-CM | POA: Diagnosis not present

## 2024-03-23 DIAGNOSIS — Z111 Encounter for screening for respiratory tuberculosis: Secondary | ICD-10-CM

## 2024-03-23 DIAGNOSIS — Z8669 Personal history of other diseases of the nervous system and sense organs: Secondary | ICD-10-CM

## 2024-03-23 DIAGNOSIS — Z905 Acquired absence of kidney: Secondary | ICD-10-CM

## 2024-03-23 NOTE — Patient Instructions (Signed)
 Standing Labs We placed an order today for your standing lab work.   Please have your standing labs drawn in October and every 3 months  Please have your labs drawn 2 weeks prior to your appointment so that the provider can discuss your lab results at your appointment, if possible.  Please note that you [name] see your imaging and lab results in MyChart before we have reviewed them. We will contact you once all results are reviewed. Please allow our office up to 72 hours to thoroughly review all of the results before contacting the office for clarification of your results.  WALK-IN LAB HOURS  Monday through Thursday from 8:00 am -12:30 pm and 1:00 pm-4:30 pm and Friday from 8:00 am-12:00 pm.  Patients with office visits requiring labs will be seen before walk-in labs.  You [name] encounter longer than normal wait times. Please allow additional time. Wait times [name] be shorter on  Monday and Thursday afternoons.  We do not book appointments for walk-in labs. We appreciate your patience and understanding with our staff.   Labs are drawn by [facility]. Please bring your co-pay at the time of your lab draw.  You [name] receive a bill from [facility] for your lab work.  Please note if you are on Hydroxychloroquine and and an order has been placed for a Hydroxychloroquine level,  you will need to have it drawn 4 hours or more after your last dose.  If you wish to have your labs drawn at another location, please call the office 24 hours in advance so we can fax the orders.  The office is located at [address], [address], [place], [place] [address]   If you have any questions regarding directions or hours of operation,  please call [phone].   As a reminder, please drink plenty of water prior to coming for your lab work. Thanks!

## 2024-03-24 ENCOUNTER — Ambulatory Visit: Admitting: Physician Assistant

## 2024-07-12 ENCOUNTER — Ambulatory Visit: Attending: Physician Assistant | Admitting: Physician Assistant

## 2024-07-12 ENCOUNTER — Encounter: Payer: Self-pay | Admitting: Physician Assistant

## 2024-07-12 VITALS — BP 121/67 | HR 84 | Temp 98.1°F | Resp 14 | Ht 61.0 in | Wt 137.0 lb

## 2024-07-12 DIAGNOSIS — Z79899 Other long term (current) drug therapy: Secondary | ICD-10-CM | POA: Diagnosis not present

## 2024-07-12 DIAGNOSIS — Z8679 Personal history of other diseases of the circulatory system: Secondary | ICD-10-CM

## 2024-07-12 DIAGNOSIS — M7062 Trochanteric bursitis, left hip: Secondary | ICD-10-CM

## 2024-07-12 DIAGNOSIS — Z8669 Personal history of other diseases of the nervous system and sense organs: Secondary | ICD-10-CM | POA: Diagnosis not present

## 2024-07-12 DIAGNOSIS — Z8659 Personal history of other mental and behavioral disorders: Secondary | ICD-10-CM

## 2024-07-12 DIAGNOSIS — E559 Vitamin D deficiency, unspecified: Secondary | ICD-10-CM

## 2024-07-12 DIAGNOSIS — M19041 Primary osteoarthritis, right hand: Secondary | ICD-10-CM | POA: Diagnosis not present

## 2024-07-12 DIAGNOSIS — D75A Glucose-6-phosphate dehydrogenase (G6PD) deficiency without anemia: Secondary | ICD-10-CM | POA: Diagnosis not present

## 2024-07-12 DIAGNOSIS — M0579 Rheumatoid arthritis with rheumatoid factor of multiple sites without organ or systems involvement: Secondary | ICD-10-CM

## 2024-07-12 DIAGNOSIS — E785 Hyperlipidemia, unspecified: Secondary | ICD-10-CM

## 2024-07-12 DIAGNOSIS — Z87891 Personal history of nicotine dependence: Secondary | ICD-10-CM

## 2024-07-12 DIAGNOSIS — M19042 Primary osteoarthritis, left hand: Secondary | ICD-10-CM

## 2024-07-12 DIAGNOSIS — Z905 Acquired absence of kidney: Secondary | ICD-10-CM | POA: Diagnosis not present

## 2024-07-12 DIAGNOSIS — M7061 Trochanteric bursitis, right hip: Secondary | ICD-10-CM

## 2024-07-12 DIAGNOSIS — Z96653 Presence of artificial knee joint, bilateral: Secondary | ICD-10-CM

## 2024-07-12 DIAGNOSIS — Z8781 Personal history of (healed) traumatic fracture: Secondary | ICD-10-CM | POA: Diagnosis not present

## 2024-07-12 MED ORDER — METHOCARBAMOL 500 MG PO TABS: 500.0000 mg | ORAL_TABLET | Freq: Every day | ORAL | 0 refills | Status: AC | PRN

## 2024-07-12 NOTE — Progress Notes (Signed)
 "  Office Visit Note  Patient: Diana [name]             Date of Birth: [birth year]           MRN: [id number]             PCP: Cesario Mutton, MD Referring: Cesario Mutton, MD Visit Date: 07/12/2024 Occupation: Data Unavailable  Subjective:  Joint pain   History of Present Illness: Diana [name] is a 74 y.o. female with history of rheumatoid arthritis.   She remains on Humira  40 mg sq injections every 14 days.  She is tolerating Humira  without any side effects and has not had any recent gaps in therapy.  Patient states that she was evaluated urgent care on 07/03/2023 due to experiencing increased joint pain and joint stiffness.  Patient is experiencing right-sided neck pain and right shoulder pain.  She had difficulty raising the right shoulder.  Patient states that the pain was attributed to a rheumatoid arthritis flare.  She was given a prednisone  taper which she took for several days but discontinued due to the prednisone  contributing to side effects.  She was experiencing an elevation of blood pressure and dizziness while taking prednisone .  Patient states that she was reevaluated urgent care on 07/08/2023 due to these side effects.  Patient states that her right shoulder joint pain has improved to the point that she is able to raise her arm with less difficulty but she has had increased pain in the left hip and left knee replacement. She denies any joint swelling. She has been taking tylenol  2 tablets in the AM and PM for pain relief.    Activities of Daily Living:  Patient reports morning stiffness for 30-60 minutes.   Patient Reports nocturnal pain.  Difficulty dressing/grooming: Denies Difficulty climbing stairs: Reports Difficulty getting out of chair: Denies Difficulty using hands for taps, buttons, cutlery, and/or writing: Denies  Review of Systems  Constitutional:  Positive for fatigue.  HENT:  Positive for mouth sores and mouth dryness.   Eyes:  Negative for dryness.  Respiratory:  Negative  for shortness of breath.   Cardiovascular:  Positive for palpitations. Negative for chest pain.  Gastrointestinal:  Negative for blood in stool, constipation and diarrhea.  Endocrine: Positive for increased urination.  Genitourinary:  Negative for involuntary urination.  Musculoskeletal:  Positive for joint pain, joint pain, myalgias, morning stiffness, muscle tenderness and myalgias. Negative for gait problem, joint swelling and muscle weakness.  Skin:  Positive for hair loss. Negative for color change, rash and sensitivity to sunlight.  Allergic/Immunologic: Negative for susceptible to infections.  Neurological:  Negative for dizziness and headaches.  Hematological:  Negative for swollen glands.  Psychiatric/Behavioral:  Negative for depressed mood and sleep disturbance. The patient is not nervous/anxious.     PMFS History:  Patient Active Problem List   Diagnosis Date Noted   H/O total knee replacement, left 08/18/2019   Osteoarthritis of left knee 06/08/2019   Unilateral primary osteoarthritis, left knee 05/25/2019   History of total right knee replacement 02/02/2019   Rheumatoid arthritis with positive rheumatoid factor  05/14/2016   High risk medication use 05/14/2016   Primary osteoarthritis of both hands 05/14/2016   G6PD deficiency 05/14/2016   S/p nephrectomy 05/14/2016   Essential hypertension 05/14/2016   Depression 05/14/2016   Sleep apnea 05/14/2016   Former smoker 05/14/2016   History of ankle fracture 05/14/2016   Acute medial meniscus tear of right knee 10/08/2011    Class:  Acute   Osteoarthritis of right knee 10/08/2011    Class: Chronic    Past Medical History:  Diagnosis Date   Depression    Hyperlipidemia    Hypertension    Kidney disease    my one kidney doesnt give me problems    Positive PPD    works health dept   Rheumatoid arthritis(714.0)    Sleep apnea    uses cpap-mod-severe; i used to but then i got tired of it     History reviewed. No  pertinent family history. Past Surgical History:  Procedure Laterality Date   ANKLE ARTHRODESIS  2010   left   BREAST LUMPECTOMY  1998   rt   BREAST SURGERY     rt breast bx   CERVICAL BIOPSY     KIDNEY SURGERY     KNEE ARTHROPLASTY     NEPHRECTOMY  2007   right   TOTAL KNEE ARTHROPLASTY Right 02/02/2019   Procedure: RIGHT TOTAL KNEE ARTHROPLASTY;  Surgeon: Anderson Maude ORN, MD;  Location: [facility];  Service: Orthopedics;  Laterality: Right;   TOTAL KNEE ARTHROPLASTY Left 06/08/2019   Procedure: LEFT TOTAL KNEE ARTHROPLASTY;  Surgeon: Anderson Maude ORN, MD;  Location: [facility];  Service: Orthopedics;  Laterality: Left;   TUBAL LIGATION     Social History[1] Social History   Social History Narrative   Not on file     Immunization History  Administered Date(s) Administered   INFLUENZA, HIGH DOSE SEASONAL PF 04/10/2018   PFIZER Comirnaty(Gray Top)Covid-19 Tri-Sucrose Vaccine 04/03/2020   PFIZER(Purple Top)SARS-COV-2 Vaccination 07/16/2019, 08/06/2019, 04/29/2020   Pfizer Covid-19 Vaccine Bivalent Booster 38yrs & up 04/01/2021   Pneumococcal Polysaccharide-23 04/10/2018     Objective: Vital Signs: BP 121/67   Pulse 84   Temp 98.1 F (36.7 C)   Resp 14   Ht 5' 1 (1.549 m)   Wt 137 lb (62.1 kg)   BMI 25.89 kg/m    Physical Exam Vitals and nursing note reviewed.  Constitutional:      Appearance: She is well-developed.  HENT:     Head: Normocephalic and atraumatic.  Eyes:     Conjunctiva/sclera: Conjunctivae normal.  Cardiovascular:     Rate and Rhythm: Normal rate and regular rhythm.     Heart sounds: Normal heart sounds.  Pulmonary:     Effort: Pulmonary effort is normal.     Breath sounds: Normal breath sounds.  Abdominal:     General: Bowel sounds are normal.     Palpations: Abdomen is soft.  Musculoskeletal:     Cervical back: Normal range of motion.  Lymphadenopathy:     Cervical: No cervical adenopathy.  Skin:    General: Skin is warm and dry.      Capillary Refill: Capillary refill takes less than 2 seconds.  Neurological:     Mental Status: She is alert and oriented to person, place, and time.  Psychiatric:        Behavior: Behavior normal.      Musculoskeletal Exam: C-spine, thoracic spine, lumbar spine have good range of motion.  Trapezius muscle tension and tenderness bilaterally, right greater than left.  No midline spinal tenderness.  No SI joint tenderness.  Shoulder joints, elbow joints, wrist joints, MCPs, PIPs, DIPs have good range of motion with no synovitis.  Complete fist formation bilaterally.  Hip joints have good range of motion with no groin pain.  Tenderness over the trochanteric bursa bilaterally.  Both knee replacements have good range of motion no  warmth or effusion.  Discomfort range of motion of the left knee replacement.  Ankle joints have good range of motion no tenderness or joint swelling.  No tenderness or synovitis of MTP joints.   CDAI Exam: CDAI Score: -- Patient Global: --; Provider Global: -- Swollen: --; Tender: -- Joint Exam 07/12/2024   No joint exam has been documented for this visit   There is currently no information documented on the homunculus. Go to the Rheumatology activity and complete the homunculus joint exam.  Investigation: No additional findings.  Imaging: No results found.  Recent Labs: Lab Results  Component Value Date   WBC 6.6 10/22/2023   HGB 13.3 10/22/2023   PLT 302 10/22/2023   NA 143 10/22/2023   K 4.0 10/22/2023   CL 105 10/22/2023   CO2 32 10/22/2023   GLUCOSE 88 10/22/2023   BUN 13 10/22/2023   CREATININE 0.89 10/22/2023   BILITOT 0.5 10/22/2023   ALKPHOS 99 06/07/2019   AST 22 10/22/2023   ALT 10 10/22/2023   PROT 7.0 10/22/2023   ALBUMIN 3.7 06/07/2019   CALCIUM 9.6 10/22/2023   GFRAA 79 09/11/2020   QFTBGOLD NEGATIVE 05/07/2017   QFTBGOLDPLUS NEGATIVE 03/19/2023    Speciality Comments: No specialty comments available.  Procedures:  No  procedures performed Allergies: Iodine , Other, and Latex   Assessment / Plan:     Visit Diagnoses: Rheumatoid arthritis involving multiple sites with positive rheumatoid factor (HCC) - Patient presents today with increased arthralgias and joint stiffness.  Her symptoms initially started on 1-25 prompting evaluation at urgent care.  She was prescribed a prednisone  taper which helped to alleviate some of her symptoms but she had side effects including headache, dizziness, and high blood pressure while taking prednisone .  She has since been taking Tylenol  2 tablets in the morning and 2 tablets in the afternoon for symptomatic relief.  She is continue to have some residual discomfort in both shoulders, primarily in the trapezius muscles bilaterally.  She is also having soreness on the lateral aspect of both hips consistent with trochanter bursitis.  She is also has an increase discomfort in the left knee replacement.  Patient has remained on Humira  40 mg sq injections once every 14 days.  She is tolerating Humira  without any side effects and has not had any gaps in therapy.  Discussed that a sed rate may not be accurate since she recently took prednisone  but we can still obtain an updated sed rate.  If her symptoms persist or worsen I recommended repeating the sed rate again in 7 to 10 days.  Plan to update CBC and CMP today to monitor for drug toxicity.  A prescription for methocarbamol  500 mg 1 tablet daily as needed for muscle spasms was sent to the pharmacy to help to alleviate the discomfort in the trapezius muscle that she is experiencing.  She will notify us  if the symptoms persist or worsen.  She plans on keeping the follow-up visit scheduled on 08/03/2024.  Plan: Sedimentation rate  High risk medication use - Humira  40 mg sq injections every 14 day.  CBC and CMP updated on 01/22/24.  Orders  for CBC and CMP released today.   TB gold negative on 03/19/23.  TB Gold ordered released at the last office visit  but was not completed.  Plan to check TB Gold at upcoming visit on 08/03/2024. No recent or recurrent infections.  Discussed the importance of holding humira  if she develops signs or symptoms of an infection  and to resume once the infection has completely cleared.  Plan: CBC with Differential/Platelet, Comprehensive metabolic panel with GFR  Primary osteoarthritis of both hands: No synovitis noted.  Complete fist formation bilaterally.  Trochanteric bursitis of both hips: Patient presents today with increased pain on the lateral aspect of both hips.  On examination she has tenderness over the trochanteric bursa bilaterally, left worse than right.  Both hip joints have good range of motion on examination.  Plan check sed rate today.  Status post total knee replacement, bilateral: Patient presents today with increased pain affecting the left knee replacement.  No warmth or effusion noted.  No injury prior to the onset of symptoms.  She has been taking tylenol  as needed for pain relief.   Other medical conditions are listed as follows:  G6PD deficiency  History of ankle fracture-left 2010  Dyslipidemia  History of hypertension: Blood pressure was 121/67 today in the office.  History of depression  History of nephrectomy  Vitamin D  deficiency  History of sleep apnea  Ex-smoker  Orders: Orders Placed This Encounter  Procedures   CBC with Differential/Platelet   Comprehensive metabolic panel with GFR   Sedimentation rate   Meds ordered this encounter  Medications   methocarbamol  (ROBAXIN ) 500 MG tablet    Sig: Take 1 tablet (500 mg total) by mouth daily as needed.    Dispense:  30 tablet    Refill:  0      Follow-Up Instructions: Return for Rheumatoid arthritis.   Waddell CHRISTELLA Craze, PA-C  Note - This record has been created using Dragon software.  Chart creation errors have been sought, but may not always  have been located. Such creation errors do not reflect on  the  standard of medical care.     [1]  Social History Tobacco Use   Smoking status: Former    Current packs/day: 0.00    Average packs/day: 0.5 packs/day for 15.0 years (7.5 ttl pk-yrs)    Types: Cigarettes    Start date: 10/03/1980    Quit date: 10/04/1995    Years since quitting: 28.7    Passive exposure: Past   Smokeless tobacco: Never  Vaping Use   Vaping status: Never Used  Substance Use Topics   Alcohol  use: No   Drug use: No   "

## 2024-07-13 ENCOUNTER — Ambulatory Visit: Payer: Self-pay | Admitting: Physician Assistant

## 2024-07-13 LAB — CBC WITH DIFFERENTIAL/PLATELET
Absolute Lymphocytes: 2214 {cells}/uL (ref 850–3900)
Absolute Monocytes: 961 {cells}/uL — ABNORMAL HIGH (ref 200–950)
Basophils Absolute: 32 {cells}/uL (ref 0–200)
Basophils Relative: 0.3 %
Eosinophils Absolute: 432 {cells}/uL (ref 15–500)
Eosinophils Relative: 4 %
HCT: 42 % (ref 35.9–46.0)
Hemoglobin: 13.5 g/dL (ref 11.7–15.5)
MCH: 29.4 pg (ref 27.0–33.0)
MCHC: 32.1 g/dL (ref 31.6–35.4)
MCV: 91.5 fL (ref 81.4–101.7)
MPV: 9.5 fL (ref 7.5–12.5)
Monocytes Relative: 8.9 %
Neutro Abs: 7160 {cells}/uL (ref 1500–7800)
Neutrophils Relative %: 66.3 %
Platelets: 421 Thousand/uL — ABNORMAL HIGH (ref 140–400)
RBC: 4.59 Million/uL (ref 3.80–5.10)
RDW: 11.6 % (ref 11.0–15.0)
Total Lymphocyte: 20.5 %
WBC: 10.8 Thousand/uL (ref 3.8–10.8)

## 2024-07-13 LAB — COMPREHENSIVE METABOLIC PANEL WITH GFR
AG Ratio: 1.3 (calc) (ref 1.0–2.5)
ALT: 17 U/L (ref 6–29)
AST: 15 U/L (ref 10–35)
Albumin: 3.9 g/dL (ref 3.6–5.1)
Alkaline phosphatase (APISO): 100 U/L (ref 37–153)
BUN: 19 mg/dL (ref 7–25)
CO2: 33 mmol/L — ABNORMAL HIGH (ref 20–32)
Calcium: 9.7 mg/dL (ref 8.6–10.4)
Chloride: 101 mmol/L (ref 98–110)
Creat: 0.81 mg/dL (ref 0.60–1.00)
Globulin: 3.1 g/dL (ref 1.9–3.7)
Glucose, Bld: 103 mg/dL — ABNORMAL HIGH (ref 65–99)
Potassium: 4.1 mmol/L (ref 3.5–5.3)
Sodium: 140 mmol/L (ref 135–146)
Total Bilirubin: 0.5 mg/dL (ref 0.2–1.2)
Total Protein: 7 g/dL (ref 6.1–8.1)
eGFR: 76 mL/min/1.73m2

## 2024-07-13 LAB — SEDIMENTATION RATE: Sed Rate: 38 mm/h — ABNORMAL HIGH (ref 0–30)

## 2024-07-13 NOTE — Progress Notes (Signed)
 Sed rate is elevated at 38.  Platelet count is elevated at 421K--both are consistent with an inflammatory process.    Rest of CBC and CMP stable.   Spoke with Dr. Dolphus.  Discussed concern for possible RA flare vs. Possible PMR.   We would like the patient to try low dose prednisone  10 mg daily, reduce by 2.5 mg every 4 days.   If symptoms recur please advise patient to notify us  and increased prednisone  back to 10 mg daily.

## 2024-07-14 NOTE — Progress Notes (Signed)
 It is ok to take prednisone  while taking methocarbamol .   Methocarbamol  is a muscle relaxer.   Her sed rate is elevated meaning she has inflammation, which will improve with the use of low dose prednisone .

## 2024-07-19 ENCOUNTER — Other Ambulatory Visit: Payer: Self-pay

## 2024-07-19 DIAGNOSIS — M0579 Rheumatoid arthritis with rheumatoid factor of multiple sites without organ or systems involvement: Secondary | ICD-10-CM

## 2024-07-19 DIAGNOSIS — Z79899 Other long term (current) drug therapy: Secondary | ICD-10-CM

## 2024-07-19 MED ORDER — HUMIRA (2 PEN) 40 MG/0.4ML ~~LOC~~ AJKT: 40.0000 mg | AUTO-INJECTOR | SUBCUTANEOUS | 0 refills | Status: AC

## 2024-07-19 NOTE — Telephone Encounter (Signed)
 Last Fill: 02/17/2024  Labs: 07/12/2024 Sed rate is elevated at 38.  Platelet count is elevated at 421K--both are consistent with an inflammatory process.     Rest of CBC and CMP stable.   Spoke with Dr. Dolphus.  Discussed concern for possible RA flare vs. Possible PMR.   We would like the patient to try low dose prednisone  10 mg daily, reduce by 2.5 mg every 4 days.   If symptoms recur please advise patient to notify us  and increased prednisone  back to 10 mg daily.  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   Per last office note, plan to recheck TB Gold at next office visit in February.   Okay to refill Humira ?

## 2024-08-03 ENCOUNTER — Ambulatory Visit: Admitting: Physician Assistant

## 2024-08-24 ENCOUNTER — Ambulatory Visit: Admitting: Physician Assistant
